# Patient Record
Sex: Male | Born: 1935 | Race: White | Hispanic: No | Marital: Married | Smoking: Former smoker
Health system: Southern US, Community
[De-identification: ages and names within clinical notes are randomized; demographics above are authoritative.]

## PROBLEM LIST (undated history)

## (undated) DIAGNOSIS — I10 Essential (primary) hypertension: Secondary | ICD-10-CM

## (undated) DIAGNOSIS — M65812 Other synovitis and tenosynovitis, left shoulder: Secondary | ICD-10-CM

## (undated) DIAGNOSIS — M7542 Impingement syndrome of left shoulder: Secondary | ICD-10-CM

## (undated) DIAGNOSIS — E7800 Pure hypercholesterolemia, unspecified: Secondary | ICD-10-CM

## (undated) DIAGNOSIS — M75122 Complete rotator cuff tear or rupture of left shoulder, not specified as traumatic: Secondary | ICD-10-CM

## (undated) DIAGNOSIS — M25552 Pain in left hip: Secondary | ICD-10-CM

## (undated) HISTORY — DX: Pain in left hip: M25.552

## (undated) HISTORY — DX: Essential (primary) hypertension: I10

## (undated) HISTORY — DX: Other synovitis and tenosynovitis, left shoulder: M65.812

## (undated) HISTORY — DX: Complete rotator cuff tear or rupture of left shoulder, not specified as traumatic: M75.122

## (undated) HISTORY — DX: Pure hypercholesterolemia, unspecified: E78.00

## (undated) HISTORY — DX: Impingement syndrome of left shoulder: M75.42

## (undated) SURGERY — ESOPHAGOGASTRODUODENOSCOPY (EGD) WITH PROPOFOL
Anesthesia: Monitor Anesthesia Care

---

## 1993-07-16 HISTORY — PX: ELBOW SURGERY: SHX618

## 2004-04-25 ENCOUNTER — Ambulatory Visit: Payer: Self-pay

## 2004-08-23 ENCOUNTER — Ambulatory Visit: Payer: Self-pay | Admitting: Unknown Physician Specialty

## 2004-12-10 ENCOUNTER — Emergency Department: Payer: Self-pay | Admitting: Emergency Medicine

## 2006-06-17 ENCOUNTER — Emergency Department: Payer: Self-pay | Admitting: Unknown Physician Specialty

## 2013-03-17 ENCOUNTER — Ambulatory Visit: Payer: Self-pay | Admitting: Unknown Physician Specialty

## 2014-04-18 ENCOUNTER — Emergency Department: Payer: Self-pay | Admitting: Emergency Medicine

## 2014-04-19 DIAGNOSIS — E7800 Pure hypercholesterolemia, unspecified: Secondary | ICD-10-CM

## 2014-04-19 DIAGNOSIS — I10 Essential (primary) hypertension: Secondary | ICD-10-CM

## 2014-04-19 HISTORY — DX: Pure hypercholesterolemia, unspecified: E78.00

## 2014-04-19 HISTORY — DX: Essential (primary) hypertension: I10

## 2014-07-21 DIAGNOSIS — M7542 Impingement syndrome of left shoulder: Secondary | ICD-10-CM

## 2014-07-21 DIAGNOSIS — M75122 Complete rotator cuff tear or rupture of left shoulder, not specified as traumatic: Secondary | ICD-10-CM

## 2014-07-21 DIAGNOSIS — M65912 Unspecified synovitis and tenosynovitis, left shoulder: Secondary | ICD-10-CM

## 2014-07-21 DIAGNOSIS — M65812 Other synovitis and tenosynovitis, left shoulder: Secondary | ICD-10-CM

## 2014-07-21 HISTORY — DX: Impingement syndrome of left shoulder: M75.42

## 2014-07-21 HISTORY — DX: Complete rotator cuff tear or rupture of left shoulder, not specified as traumatic: M75.122

## 2014-07-21 HISTORY — DX: Unspecified synovitis and tenosynovitis, left shoulder: M65.912

## 2014-07-21 HISTORY — DX: Other synovitis and tenosynovitis, left shoulder: M65.812

## 2016-02-08 DIAGNOSIS — M25552 Pain in left hip: Secondary | ICD-10-CM | POA: Insufficient documentation

## 2016-02-08 HISTORY — DX: Pain in left hip: M25.552

## 2016-05-15 ENCOUNTER — Telehealth: Payer: Self-pay | Admitting: Gastroenterology

## 2016-05-15 NOTE — Telephone Encounter (Signed)
Left voice message for patient to call and schedule appointment with GI for dysphagia. Referred by [facility]

## 2016-05-22 ENCOUNTER — Encounter: Payer: Self-pay | Admitting: Gastroenterology

## 2016-05-22 ENCOUNTER — Other Ambulatory Visit: Payer: Self-pay

## 2016-05-22 ENCOUNTER — Other Ambulatory Visit: Payer: Self-pay | Admitting: Gastroenterology

## 2016-05-22 ENCOUNTER — Ambulatory Visit (INDEPENDENT_AMBULATORY_CARE_PROVIDER_SITE_OTHER): Payer: Medicare Other | Admitting: Gastroenterology

## 2016-05-22 ENCOUNTER — Telehealth: Payer: Self-pay | Admitting: Gastroenterology

## 2016-05-22 VITALS — BP 135/87 | HR 101 | Temp 97.4°F | Wt 154.0 lb

## 2016-05-22 DIAGNOSIS — R1310 Dysphagia, unspecified: Secondary | ICD-10-CM

## 2016-05-22 DIAGNOSIS — R1312 Dysphagia, oropharyngeal phase: Secondary | ICD-10-CM

## 2016-05-22 DIAGNOSIS — R1311 Dysphagia, oral phase: Secondary | ICD-10-CM

## 2016-05-22 DIAGNOSIS — R1319 Other dysphagia: Secondary | ICD-10-CM

## 2016-05-22 NOTE — Progress Notes (Signed)
Gastroenterology Consultation  Referring Provider:     No ref. provider found Primary Care Physician:  Devon PentonMark F Miller, MD Primary Gastroenterologist:  Devon. Wyline MoodKiran Jarett Elliott  Reason for Consultation:     dysphagia        HPI:   Devon FreedHoward S Saur Jr. is a 82 y.o. y/o male referred for consultation & management  by Devon. Danella PentonMark F Miller, MD.     Dysphagia: Onset and any progression: 3 weeks back was having an omlette , got stuck , points to his chest , went to the restroom,threw up , returned and still could not swallow. 2 weeks after, was having dinner, having soup, and "got stuck " had to go back to the rest room and throw up again.He says he had a lot of associated coughing.  Presently no issues . Able to swallow solids and liquids.   History of asthma/allergy : no History of heartburn/Reflux : no  Weight loss : no   Prior EGD: many years back recalls had an esophageal dilation PPI/H2 blocker use : No    Not on any blood thinners.  Past Medical History:  Diagnosis Date  . Complete tear of left rotator cuff 07/21/2014  . Essential hypertension 04/19/2014  . Impingement syndrome of left shoulder 07/21/2014  . Left hip pain 02/08/2016  . Left supraspinatus tenosynovitis 07/21/2014  . Pure hypercholesterolemia 04/19/2014    Past Surgical History:  Procedure Laterality Date  . ELBOW SURGERY  1995    Prior to Admission medications   Medication Sig Start Date End Date Taking? Authorizing Provider  amLODipine (NORVASC) 10 MG tablet Take by mouth.    Historical Provider, MD  Colchicine 0.6 MG CAPS TAKE 1 CAPSULE EVERY DAY FOR GOUT 02/07/16   Historical Provider, MD  fluorouracil (EFUDEX) 5 % cream Apply to area in front of left ear twice daily for four weeks. 02/28/15   Historical Provider, MD  hydrochlorothiazide (HYDRODIURIL) 12.5 MG tablet Take 12.5 mg by mouth.    Historical Provider, MD  lisinopril (PRINIVIL,ZESTRIL) 10 MG tablet Take 10 mg by mouth.    Historical Provider, MD  traMADol  Janean Sark(ULTRAM) 50 MG tablet Take by mouth. 08/05/15   Historical Provider, MD    Family History  Problem Relation Age of Onset  . Pancreatic cancer Mother      Social History  Substance Use Topics  . Smoking status: Former Games developermoker  . Smokeless tobacco: Never Used  . Alcohol use Yes     Comment: social    Allergies as of 05/22/2016 - never reviewed  Allergen Reaction Noted  . Ace inhibitors  01/31/2016  . Clarithromycin Hives 04/19/2014  . Etodolac  01/31/2016  . Levofloxacin Swelling 04/19/2014    Review of Systems:    All systems reviewed and negative except where noted in HPI.   Physical Exam:  There were no vitals taken for this visit. No LMP for male patient. Psych:  Alert and cooperative. Normal mood and affect. General:   Alert,  Well-developed, well-nourished, pleasant and cooperative in NAD Head:  Normocephalic and atraumatic. Eyes:  Sclera clear, no icterus.   Conjunctiva pink. Ears:  Normal auditory acuity. Nose:  No deformity, discharge, or lesions. Mouth:  No deformity or lesions,oropharynx pink & moist. Neck:  Supple; no masses or thyromegaly. Lungs:  Respirations even and unlabored.  Clear throughout to auscultation.   No wheezes, crackles, or rhonchi. No acute distress. Heart:  Regular rate and rhythm; no murmurs, clicks, rubs, or gallops.  Abdomen:  Normal bowel sounds.  No bruits.  Soft, non-tender and non-distended without masses, hepatosplenomegaly or hernias noted.  No guarding or rebound tenderness.    Msk:  Symmetrical without gross deformities. Good, equal movement & strength bilaterally. Pulses:  Normal pulses noted. Extremities:  No clubbing or edema.  No cyanosis. Neurologic:  Alert and oriented x3;  grossly normal neurologically. Skin:  Intact without significant lesions or rashes. No jaundice. Lymph Nodes:  No significant cervical adenopathy. Psych:  Alert and cooperative. Normal mood and affect.  Imaging Studies: No results found.  Assessment  and Plan:   Devon FreedHoward S Soler Jr. is a 82 y.o. y/o male has been referred for dysphagia on 2 recent occasions. His history is not clear , he does give some history of transfer dysphagia as well as esophageal dysphagia .   Plan :  1. EGD +/- dilation  2. Modified barium swallow to evaluate for transfer dysphagia.   I have discussed alternative options, risks & benefits,  which include, but are not limited to, bleeding, infection, perforation,respiratory complication & drug reaction.  The patient agrees with this plan & written consent will be obtained.    Follow up in 6 weeks  Devon Wyline MoodKiran Nimesh Riolo MD

## 2016-05-22 NOTE — Telephone Encounter (Signed)
Patients wife wanted to know what the plan was, updated my plan of tests based on his symptoms.   She had no further questions.   Dr Wyline MoodKiran Jayion Schneck  Gastroenterology/Hepatology Pager: [phone]

## 2016-05-31 ENCOUNTER — Encounter
Admission: RE | Disposition: A | Discharge status: Home / Self Care | Payer: Self-pay | Source: Ambulatory Visit | Attending: Gastroenterology

## 2016-05-31 ENCOUNTER — Ambulatory Visit: Payer: Medicare Other | Admitting: Anesthesiology

## 2016-05-31 ENCOUNTER — Ambulatory Visit
Admission: RE | Admit: 2016-05-31 | Discharge: 2016-05-31 | Disposition: A | Discharge status: Home / Self Care | Payer: Medicare Other | Source: Ambulatory Visit | Attending: Gastroenterology | Admitting: Gastroenterology

## 2016-05-31 ENCOUNTER — Encounter: Payer: Self-pay | Admitting: *Deleted

## 2016-05-31 DIAGNOSIS — K222 Esophageal obstruction: Secondary | ICD-10-CM

## 2016-05-31 DIAGNOSIS — R1310 Dysphagia, unspecified: Secondary | ICD-10-CM | POA: Insufficient documentation

## 2016-05-31 DIAGNOSIS — Z87891 Personal history of nicotine dependence: Secondary | ICD-10-CM | POA: Diagnosis not present

## 2016-05-31 DIAGNOSIS — K210 Gastro-esophageal reflux disease with esophagitis: Secondary | ICD-10-CM

## 2016-05-31 DIAGNOSIS — I10 Essential (primary) hypertension: Secondary | ICD-10-CM | POA: Diagnosis not present

## 2016-05-31 DIAGNOSIS — J449 Chronic obstructive pulmonary disease, unspecified: Secondary | ICD-10-CM | POA: Diagnosis not present

## 2016-05-31 DIAGNOSIS — E7800 Pure hypercholesterolemia, unspecified: Secondary | ICD-10-CM | POA: Diagnosis not present

## 2016-05-31 HISTORY — PX: ESOPHAGOGASTRODUODENOSCOPY (EGD) WITH PROPOFOL: SHX5813

## 2016-05-31 SURGERY — ESOPHAGOGASTRODUODENOSCOPY (EGD) WITH PROPOFOL
Anesthesia: General

## 2016-05-31 MED ORDER — PROPOFOL 10 MG/ML IV BOLUS
INTRAVENOUS | Status: DC | PRN
  Administered 2016-05-31: 40 mg via INTRAVENOUS

## 2016-05-31 MED ORDER — SODIUM CHLORIDE 0.9 % IV SOLN
INTRAVENOUS | Status: DC
  Administered 2016-05-31: 11:00:00 via INTRAVENOUS

## 2016-05-31 MED ORDER — GLYCOPYRROLATE 0.2 MG/ML IJ SOLN
INTRAMUSCULAR | Status: DC | PRN
  Administered 2016-05-31: 0.2 mg via INTRAVENOUS

## 2016-05-31 MED ORDER — OMEPRAZOLE 40 MG PO CPDR: 40.0000 mg | DELAYED_RELEASE_CAPSULE | Freq: Every day | ORAL | 0 refills | Status: DC

## 2016-05-31 MED ORDER — LIDOCAINE HCL (CARDIAC) 20 MG/ML IV SOLN
INTRAVENOUS | Status: DC | PRN
  Administered 2016-05-31: 80 mg via INTRAVENOUS

## 2016-05-31 MED ORDER — FENTANYL CITRATE (PF) 100 MCG/2ML IJ SOLN
INTRAMUSCULAR | Status: DC | PRN
  Administered 2016-05-31: 25 ug via INTRAVENOUS

## 2016-05-31 MED ORDER — PROPOFOL 500 MG/50ML IV EMUL
INTRAVENOUS | Status: DC | PRN
  Administered 2016-05-31: 100 ug/kg/min via INTRAVENOUS

## 2016-05-31 NOTE — Anesthesia Preprocedure Evaluation (Signed)
Anesthesia Evaluation  Patient identified by MRN, date of birth, ID band Patient awake    Reviewed: Allergy & Precautions, NPO status , Patient's Chart, lab work & pertinent test results  History of Anesthesia Complications Negative for: history of anesthetic complications  Airway Mallampati: II  TM Distance: >3 FB Neck ROM: Full    Dental  (+) Poor Dentition   Pulmonary neg sleep apnea, neg COPD, former smoker,    breath sounds clear to auscultation- rhonchi (-) wheezing      Cardiovascular hypertension, Pt. on medications (-) CAD and (-) Past MI  Rhythm:Regular Rate:Normal - Systolic murmurs and - Diastolic murmurs    Neuro/Psych negative neurological ROS  negative psych ROS   GI/Hepatic Neg liver ROS, dysphagia   Endo/Other  negative endocrine ROSneg diabetes  Renal/GU negative Renal ROS     Musculoskeletal negative musculoskeletal ROS (+)   Abdominal (+) - obese,   Peds  Hematology negative hematology ROS (+)   Anesthesia Other Findings Past Medical History: 07/21/2014: Complete tear of left rotator cuff 04/19/2014: Essential hypertension 07/21/2014: Impingement syndrome of left shoulder 02/08/2016: Left hip pain 07/21/2014: Left supraspinatus tenosynovitis 04/19/2014: Pure hypercholesterolemia   Reproductive/Obstetrics                             Anesthesia Physical Anesthesia Plan  ASA: II  Anesthesia Plan: General   Post-op Pain Management:    Induction: Intravenous  Airway Management Planned: Natural Airway  Additional Equipment:   Intra-op Plan:   Post-operative Plan:   Informed Consent: I have reviewed the patients History and Physical, chart, labs and discussed the procedure including the risks, benefits and alternatives for the proposed anesthesia with the patient or authorized representative who has indicated his/her understanding and acceptance.   Dental advisory  given  Plan Discussed with: CRNA and Anesthesiologist  Anesthesia Plan Comments:         Anesthesia Quick Evaluation

## 2016-05-31 NOTE — Transfer of Care (Signed)
Immediate Anesthesia Transfer of Care Note  Patient: Devon FreedHoward S Wahid Jr.  Procedure(s) Performed: Procedure(s): ESOPHAGOGASTRODUODENOSCOPY (EGD) WITH PROPOFOL (N/A)  Patient Location: PACU  Anesthesia Type:General  Level of Consciousness: sedated  Airway & Oxygen Therapy: Patient Spontanous Breathing and Patient connected to nasal cannula oxygen  Post-op Assessment: Report given to RN and Post -op Vital signs reviewed and stable  Post vital signs: Reviewed and stable  Last Vitals:  Vitals:   05/31/16 1026 05/31/16 1153  BP: (!) 142/85 106/84  Pulse: 82 (!) 101  Resp: 16 15  Temp: 36.7 C (P) 36.1 C    Last Pain:  Vitals:   05/31/16 1153  TempSrc: (P) Oral         Complications: No apparent anesthesia complications

## 2016-05-31 NOTE — Op Note (Signed)
[facility] Gastroenterology Patient Name: Devon GuthrieHoward Elliott Procedure Date: 05/31/2016 11:40 AM MRN: [id number] Account #: [id number] Date of Birth: 10/03/1926 Admit Type: Ambulatory Age: 82 [facility] 4 Gender: Male Note Status: Finalized Procedure:            Upper GI endoscopy Indications:          Dysphagia Providers:            Wyline MoodKiran Aliayah Tyer MD, MD Medicines:            Monitored Anesthesia Care Complications:        No immediate complications. Procedure:            Pre-Anesthesia Assessment:                       - ASA Grade Assessment: II - A patient with mild                        systemic disease.                       - Prior to the procedure, a History and Physical was                        performed, and patient medications, allergies and                        sensitivities were reviewed. The patient's tolerance of                        previous anesthesia was reviewed.                       - The risks and benefits of the procedure and the                        sedation options and risks were discussed with the                        patient. All questions were answered and informed                        consent was obtained.                       - Prior to the procedure, a History and Physical was                        performed, and patient medications, allergies and                        sensitivities were reviewed. The patient's tolerance of                        previous anesthesia was reviewed.                       - The risks and benefits of the procedure and the                        sedation options and risks were discussed with the  patient. All questions were answered and informed                        consent was obtained.                       After obtaining informed consent, the endoscope was                        passed under direct vision. Throughout the procedure,                        the  patient's blood pressure, pulse, and oxygen                        saturations were monitored continuously. The Endoscope                        was introduced through the mouth, and advanced to the                        third part of duodenum. The upper GI endoscopy was                        accomplished with ease. The patient tolerated the                        procedure well. Findings:      The stomach was normal.      Patchy moderately congested mucosa without active bleeding and with no       stigmata of bleeding was found in the first portion of the duodenum.       This was biopsied with a cold forceps for histology.      LA Grade A (one or more mucosal breaks less than 5 mm, not extending       between tops of 2 mucosal folds) esophagitis with bleeding was found 40       cm from the incisors.      One mild benign-appearing, intrinsic stenosis was found 40 cm from the       incisors. This measured 1.3 cm (inner diameter) x less than one cm (in       length) and was traversed. Impression:           - Normal stomach.                       - Congested duodenal mucosa. Biopsied.                       - LA Grade A reflux esophagitis.                       - Benign-appearing esophageal stenosis. Recommendation:       - Await pathology results.                       - Repeat upper endoscopy in 6 weeks to check healing.                       - Return to GI office as previously scheduled.                       -  Patient has a contact number available for                        emergencies. The signs and symptoms of potential                        delayed complications were discussed with the patient.                        Return to normal activities tomorrow. Written discharge                        instructions were provided to the patient.                       - Resume previous diet.                       - Use Prilosec (omeprazole) 40 mg PO daily for 6 weeks. Procedure Code(s):     --- Professional ---                       [phone], Esophagogastroduodenoscopy, flexible, transoral;                        with biopsy, single or multiple Diagnosis Code(s):    --- Professional ---                       K31.89, Other diseases of stomach and duodenum                       K21.0, Gastro-esophageal reflux disease with esophagitis                       K22.2, Esophageal obstruction                       R13.10, Dysphagia, unspecified CPT copyright 2016 American Medical Association. All rights reserved. The codes documented in this report are preliminary and upon coder review may  be revised to meet current compliance requirements. Wyline MoodKiran Sun Kihn, MD Wyline MoodKiran Cuca Benassi MD, MD 05/31/2016 11:52:57 AM This report has been signed electronically. Number of Addenda: 0 Note Initiated On: 05/31/2016 11:40 AM      [facility]

## 2016-05-31 NOTE — Anesthesia Postprocedure Evaluation (Signed)
Anesthesia Post Note  Patient: Azucena FreedHoward S Plog Jr.  Procedure(s) Performed: Procedure(s) (LRB): ESOPHAGOGASTRODUODENOSCOPY (EGD) WITH PROPOFOL (N/A)  Patient location during evaluation: Endoscopy Anesthesia Type: General Level of consciousness: awake and alert and oriented Pain management: pain level controlled Vital Signs Assessment: post-procedure vital signs reviewed and stable Respiratory status: spontaneous breathing, nonlabored ventilation and respiratory function stable Cardiovascular status: blood pressure returned to baseline and stable Postop Assessment: no signs of nausea or vomiting Anesthetic complications: no    Last Vitals:  Vitals:   05/31/16 1026 05/31/16 1153  BP: (!) 142/85 106/84  Pulse: 82 (!) 101  Resp: 16 15  Temp: 36.7 C 36.1 C    Last Pain:  Vitals:   05/31/16 1153  TempSrc: Oral                 Shyloh Krinke

## 2016-05-31 NOTE — H&P (Signed)
  Devon MoodKiran Ermalee Mealy MD [address] [name], [place] [address] Phone: [phone] Fax : [phone]  Primary Care Physician:  Danella PentonMark F Miller, MD Primary Gastroenterologist:  Dr. Wyline MoodKiran Deshon Hsiao   Pre-Procedure History & Physical: HPI:  Devon FreedHoward S Ligman Jr. is a 82 y.o. male is here for an endoscopy.   Past Medical History:  Diagnosis Date  . Complete tear of left rotator cuff 07/21/2014  . Essential hypertension 04/19/2014  . Impingement syndrome of left shoulder 07/21/2014  . Left hip pain 02/08/2016  . Left supraspinatus tenosynovitis 07/21/2014  . Pure hypercholesterolemia 04/19/2014    Past Surgical History:  Procedure Laterality Date  . ELBOW SURGERY  1995    Prior to Admission medications   Medication Sig Start Date End Date Taking? Authorizing Provider  amLODipine (NORVASC) 10 MG tablet Take by mouth.   Yes Historical Provider, MD  Colchicine 0.6 MG CAPS TAKE 1 CAPSULE EVERY DAY FOR GOUT 02/07/16   Historical Provider, MD  fluorouracil (EFUDEX) 5 % cream Apply to area in front of left ear twice daily for four weeks. 02/28/15   Historical Provider, MD  hydrochlorothiazide (HYDRODIURIL) 12.5 MG tablet Take 12.5 mg by mouth.    Historical Provider, MD  lisinopril (PRINIVIL,ZESTRIL) 10 MG tablet Take 10 mg by mouth.    Historical Provider, MD  traMADol Janean Sark(ULTRAM) 50 MG tablet Take by mouth. 08/05/15   Historical Provider, MD    Allergies as of 05/22/2016 - Review Complete 05/22/2016  Allergen Reaction Noted  . Ace inhibitors  01/31/2016  . Clarithromycin Hives 04/19/2014  . Etodolac  01/31/2016  . Levofloxacin Swelling 04/19/2014    Family History  Problem Relation Age of Onset  . Pancreatic cancer Mother     Social History   Social History  . Marital status: Married    Spouse name: N/A  . Number of children: N/A  . Years of education: N/A   Occupational History  . Not on file.   Social History Main Topics  . Smoking status: Former Games developermoker  . Smokeless tobacco: Never  Used  . Alcohol use Yes     Comment: social  . Drug use: No  . Sexual activity: Not on file   Other Topics Concern  . Not on file   Social History Narrative  . No narrative on file    Review of Systems: See HPI, otherwise negative ROS  Physical Exam: BP (!) 142/85   Pulse 82   Temp 98 F (36.7 C) (Oral)   Resp 16   Ht 5\' 9"  (1.753 m)   Wt 154 lb (69.9 kg)   SpO2 100%   BMI 22.74 kg/m  General:   Alert,  pleasant and cooperative in NAD Head:  Normocephalic and atraumatic. Neck:  Supple; no masses or thyromegaly. Lungs:  Clear throughout to auscultation.    Heart:  Regular rate and rhythm. Abdomen:  Soft, nontender and nondistended. Normal bowel sounds, without guarding, and without rebound.   Neurologic:  Alert and  oriented x4;  grossly normal neurologically.  Impression/Plan: Devon FreedHoward S Tippetts Jr. is here for an endoscopy to be performed for dysphagia  Risks, benefits, limitations, and alternatives regarding  endoscopy have been reviewed with the patient.  Questions have been answered.  All parties agreeable.   Devon MoodKiran Rusty Villella, MD  05/31/2016, 11:00 AM

## 2016-06-01 LAB — SURGICAL PATHOLOGY

## 2016-06-18 ENCOUNTER — Ambulatory Visit
Admission: RE | Admit: 2016-06-18 | Discharge: 2016-06-18 | Disposition: A | Discharge status: Home / Self Care | Payer: Medicare Other | Source: Ambulatory Visit | Attending: Gastroenterology | Admitting: Gastroenterology

## 2016-06-18 DIAGNOSIS — R1312 Dysphagia, oropharyngeal phase: Secondary | ICD-10-CM | POA: Insufficient documentation

## 2016-06-18 DIAGNOSIS — E7800 Pure hypercholesterolemia, unspecified: Secondary | ICD-10-CM | POA: Diagnosis not present

## 2016-06-18 DIAGNOSIS — I10 Essential (primary) hypertension: Secondary | ICD-10-CM | POA: Diagnosis not present

## 2016-06-18 NOTE — Therapy (Signed)
[facility] DIAGNOSTIC RADIOLOGY [address] [place], [place], [address] Phone: [phone]   Fax:     Modified Barium Swallow  Patient Details  Name: Devon FreedHoward S Tilmon Jr. MRN: [id number] Date of Birth: 02/08/1927 No Data Recorded  Encounter Date: 06/18/2016      End of Session - 06/18/16 1328    Visit Number 1   Number of Visits 1   Date for SLP Re-Evaluation 06/18/16   SLP Start Time 1245   SLP Stop Time  1328   SLP Time Calculation (min) 43 min   Activity Tolerance Patient tolerated treatment well      Past Medical History:  Diagnosis Date  . Complete tear of left rotator cuff 07/21/2014  . Essential hypertension 04/19/2014  . Impingement syndrome of left shoulder 07/21/2014  . Left hip pain 02/08/2016  . Left supraspinatus tenosynovitis 07/21/2014  . Pure hypercholesterolemia 04/19/2014    Past Surgical History:  Procedure Laterality Date  . ELBOW SURGERY  1995  . ESOPHAGOGASTRODUODENOSCOPY (EGD) WITH PROPOFOL N/A 05/31/2016   Procedure: ESOPHAGOGASTRODUODENOSCOPY (EGD) WITH PROPOFOL;  Surgeon: Wyline MoodKiran Anna, MD;  Location: [facility] ENDOSCOPY;  Service: Endoscopy;  Laterality: N/A;    There were no vitals filed for this visit.   Subjective: Patient behavior: (alertness, ability to follow instructions, etc.): The patient is alert, able to verbalize his symptoms, and follow directions.  Chief complaint: 2 recent episodes of choking   Objective:  Radiological Procedure: A videoflouroscopic evaluation of oral-preparatory, reflex initiation, and pharyngeal phases of the swallow was performed; as well as a screening of the upper esophageal phase.  I. POSTURE: Upright in MBS chair  II. VIEW: Lateral  III. COMPENSATORY STRATEGIES: N/A  IV. BOLUSES ADMINISTERED:   Thin Liquid: 2 small sips, 3 rapid consecutive sips   Nectar-thick Liquid: 1 moderate sip    Puree: 2 teaspoon presentations   Mechanical Soft: 1/ graham cracker in  applesauce  V. RESULTS OF EVALUATION: A. ORAL PREPARATORY PHASE: (The lips, tongue, and velum are observed for strength and coordination)       **Overall Severity Rating: Within normal limits  B. SWALLOW INITIATION/REFLEX: (The reflex is normal if "triggered" by the time the bolus reached the base of the tongue)  **Overall Severity Rating: Within normal limits  C. PHARYNGEAL PHASE: (Pharyngeal function is normal if the bolus shows rapid, smooth, and continuous transit through the pharynx and there is no pharyngeal residue after the swallow)  **Overall Severity Rating: Within normal limits  D. LARYNGEAL PENETRATION: (Material entering into the laryngeal inlet/vestibule but not aspirated) None  E. ASPIRATION: None  F. ESOPHAGEAL PHASE: (Screening of the upper esophagus): In the cervical esophagus there is a finger-like protrusion along the posterior wall during swallow (does not impede flow of boluses) consistent with prominent cricopharyngeus.     ASSESSMENT: This 82 year old man, with recent choking episodes, is presenting with normal oropharyngeal swallowing.  Oral control of the bolus including oral hold, rotary mastication, and anterior to posterior transfer are within normal limits. Timing of the pharyngeal swallow is within normal limits.  Aspects of the pharyngeal stage of swallowing including tongue base retraction, hyolaryngeal excursion, epiglottic inversion, duration/amplitude of UES opening, and laryngeal vestibule closure at the height of the swallow are within normal limits.  There is no vallecular residue.  There was no observed laryngeal penetration or aspiration.  It does not appear that choking is due to oropharyngeal dysphagia.  In the cervical esophagus there is a finger-like protrusion  along the posterior wall during swallow (does not impede flow of boluses) consistent with prominent cricopharyngeus.     PLAN/RECOMMENDATIONS:   A. Diet: Regular   B. Swallowing Precautions:  chew well, be mindful while eating and drinking   C. Recommended consultation to: return to Dr. Tobi BastosAnna as scheduled   D. Therapy recommendations N/A   E. Results and recommendations were discussed with the patient immediately following the study and the final report routed to the referring MD   Oropharyngeal dysphagia - Plan: DG OP Swallowing Func-Medicare/Speech Path, DG OP Swallowing Func-Medicare/Speech Path      G-Codes - 06/18/16 1329    Functional Assessment Tool Used MBSS, clinical judgment   Functional Limitations Swallowing   Swallow Current Status (Z6109(G8996) 0 percent impaired, limited or restricted   Swallow Goal Status (U0454(G8997) 0 percent impaired, limited or restricted   Swallow Discharge Status (U9811(G8998) 0 percent impaired, limited or restricted          Problem List Patient Active Problem List   Diagnosis Date Noted  . Oral phase dysphagia 05/22/2016  . Esophageal dysphagia 05/22/2016  . Left hip pain 02/08/2016  . Complete tear of left rotator cuff 07/21/2014  . Impingement syndrome of left shoulder 07/21/2014  . Left supraspinatus tenosynovitis 07/21/2014  . Essential hypertension 04/19/2014  . Pure hypercholesterolemia 04/19/2014   Dollene PrimroseSusan G Jeptha Hinnenkamp, MS/CCC- SLP  Leandrew KoyanagiAbernathy, Susie 06/18/2016, 1:29 PM  [facility] DIAGNOSTIC RADIOLOGY [address] [place], [place], [address] Phone: [phone]   Fax:     Name: Devon FreedHoward S Lazenby Jr. MRN: [id number] Date of Birth: 03/10/1927

## 2016-06-18 NOTE — Progress Notes (Signed)
Normal

## 2016-06-21 ENCOUNTER — Telehealth: Payer: Self-pay

## 2016-06-21 NOTE — Telephone Encounter (Signed)
Left vm on home number barium swallow was normal.

## 2016-06-21 NOTE — Telephone Encounter (Signed)
-----   Message from Wyline MoodKiran Anna, MD sent at 06/18/2016  2:22 PM EST ----- Normal

## 2016-06-25 ENCOUNTER — Ambulatory Visit: Payer: Medicare Other | Admitting: Gastroenterology

## 2016-06-26 ENCOUNTER — Encounter: Payer: Self-pay | Admitting: Gastroenterology

## 2016-06-26 ENCOUNTER — Other Ambulatory Visit: Payer: Self-pay

## 2016-06-26 ENCOUNTER — Ambulatory Visit (INDEPENDENT_AMBULATORY_CARE_PROVIDER_SITE_OTHER): Payer: Medicare Other | Admitting: Gastroenterology

## 2016-06-26 VITALS — BP 135/81 | HR 87 | Temp 98.7°F | Ht 68.0 in | Wt 161.0 lb

## 2016-06-26 DIAGNOSIS — K2100 Gastro-esophageal reflux disease with esophagitis, without bleeding: Secondary | ICD-10-CM

## 2016-06-26 DIAGNOSIS — R1310 Dysphagia, unspecified: Secondary | ICD-10-CM

## 2016-06-26 DIAGNOSIS — K222 Esophageal obstruction: Secondary | ICD-10-CM | POA: Diagnosis not present

## 2016-06-26 DIAGNOSIS — R1319 Other dysphagia: Secondary | ICD-10-CM

## 2016-06-26 MED ORDER — OMEPRAZOLE 40 MG PO CPDR: 40.0000 mg | DELAYED_RELEASE_CAPSULE | Freq: Every day | ORAL | 1 refills | Status: DC

## 2016-06-26 NOTE — Progress Notes (Signed)
Primary Care Physician: Danella PentonMark F Miller, MD  Primary Gastroenterologist:  Dr. Wyline MoodKiran Laiken Nohr   Chief Complaint  Patient presents with  . Follow-up    Upper Endoscopy and Modified Barium Swallow follow up     HPI: Devon FreedHoward S Genson Jr. is a 82 y.o. male here for follow up for dysphagia. He was lasyt seen on 05/22/2016 .  EGD 05/31/16 showed Grade A distal esophagitis with a stricture about 13 mm. It was traversed but not dilated since he had active inflammation. I commenced him on PPI with aim to dilate him in 6 weeks. Today he says that he is unsure if he ever did commence on the PPI after the endoscopy. He has since stopped eating steak and has had no issues with swallowing  Modified barium swallow was normal .  Current Outpatient Prescriptions  Medication Sig Dispense Refill  . fluorouracil (EFUDEX) 5 % cream Apply to area in front of left ear twice daily for four weeks.    . traMADol (ULTRAM) 50 MG tablet Take by mouth.     No current facility-administered medications for this visit.     Allergies as of 06/26/2016 - Review Complete 06/26/2016  Allergen Reaction Noted  . Ace inhibitors  01/31/2016  . Clarithromycin Hives 04/19/2014  . Etodolac  01/31/2016  . Levofloxacin Swelling 04/19/2014    ROS:  General: Negative for anorexia, weight loss, fever, chills, fatigue, weakness. ENT: Negative for hoarseness, difficulty swallowing , nasal congestion. CV: Negative for chest pain, angina, palpitations, dyspnea on exertion, peripheral edema.  Respiratory: Negative for dyspnea at rest, dyspnea on exertion, cough, sputum, wheezing.  GI: See history of present illness. GU:  Negative for dysuria, hematuria, urinary incontinence, urinary frequency, nocturnal urination.  Endo: Negative for unusual weight change.    Physical Examination:   BP 135/81   Pulse 87   Temp 98.7 F (37.1 C) (Oral)   Ht 5\' 8"  (1.727 m)   Wt 161 lb (73 kg)   BMI 24.48 kg/m   General: Well-nourished,  well-developed in no acute distress.  Eyes: No icterus. Conjunctivae pink. Mouth: Oropharyngeal mucosa moist and pink , no lesions erythema or exudate. Lungs: Clear to auscultation bilaterally. Non-labored. Heart: Regular rate and rhythm, no murmurs rubs or gallops.  Abdomen: Bowel sounds are normal, nontender, nondistended, no hepatosplenomegaly or masses, no abdominal bruits or hernia , no rebound or guarding.   Extremities: No lower extremity edema. No clubbing or deformities. Neuro: Alert and oriented x 3.  Grossly intact. Skin: Warm and dry, no jaundice.   Psych: Alert and cooperative, normal mood and affect.  Imaging Studies: Dg Op Swallowing Func-medicare/speech Path  Result Date: 06/18/2016 CLINICAL DATA:  Dysphagia. EXAM: MODIFIED BARIUM SWALLOW TECHNIQUE: Different consistencies of barium were administered orally to the patient by the Speech Pathologist. Imaging of the pharynx was performed in the lateral projection. FLUOROSCOPY TIME:  Fluoroscopy Time:  0 minutes 42 seconds Radiation Exposure Index (if provided by the fluoroscopic device): 1.8 mGy Number of Acquired Spot Images: Cine images COMPARISON:  None. FINDINGS: Thin liquid- within normal limits Nectar thick liquid- within normal limits Pure- within normal limits Pure with cracker- within normal limits IMPRESSION: Normal exam. Please refer to the Speech Pathologists report for complete details and recommendations. Electronically Signed   By: Maisie Fushomas  Register   On: 06/18/2016 14:02    Assessment and Plan:   Devon FreedHoward S Staffa Jr. is a 82 y.o. y/o male here to follow up for dysphagia. Recent EGD demonstrated Grade  A esophagitis and an esophageal stricture.I commenced him on a PPI with aim to dilate him in 6 weeks time. He is unsure if he started his PPI after the EGD. Hence I suggested we start on PPI today for 6 weeks to heal the esophagitis prior to dilation.   Plan  1. EGD+dilation after 6 weeks  I have discussed  alternative options, risks & benefits,  which include, but are not limited to, bleeding, infection, perforation,respiratory complication & drug reaction.  The patient agrees with this plan & written consent will be obtained.      Dr Wyline MoodKiran Aleene Swanner  MD Follow up in 3 months

## 2016-07-19 ENCOUNTER — Ambulatory Visit: Admission: RE | Admit: 2016-07-19 | Payer: Medicare Other | Source: Ambulatory Visit | Admitting: Gastroenterology

## 2016-07-19 ENCOUNTER — Encounter: Admission: RE | Payer: Self-pay | Source: Ambulatory Visit

## 2016-07-19 SURGERY — ESOPHAGOGASTRODUODENOSCOPY (EGD) WITH PROPOFOL
Anesthesia: General

## 2016-09-12 ENCOUNTER — Telehealth: Payer: Self-pay

## 2016-09-12 NOTE — Telephone Encounter (Signed)
LVM for pt. Advised callback for follow-up questions per Dr. Tobi BastosAnna.

## 2016-09-12 NOTE — Telephone Encounter (Signed)
-----   Message from Wyline MoodKiran Anna, MD sent at 09/11/2016  3:40 PM EST ----- Normal exam .  Marl Seago -check with the patient how his swallowing is doing    FYI Dr Hyacinth MeekerMiller

## 2016-09-13 ENCOUNTER — Telehealth: Payer: Self-pay

## 2016-09-13 NOTE — Telephone Encounter (Signed)
-----   Message from Kiran Anna, MD sent at 09/11/2016  3:40 PM EST ----- Normal exam .  Kemper Heupel -check with the patient how his swallowing is doing    FYI Dr Miller 

## 2016-09-13 NOTE — Telephone Encounter (Signed)
Advised pt of exam results per Dr. Tobi BastosAnna.   Pt states having no problems and finished medication 2 days prior.

## 2017-05-27 IMAGING — RF DG SWALLOWING FUNCTION
1 series · 5 of 5 positions shown · non-contrast
Comparison: None.

CLINICAL DATA: Dysphagia.

EXAM:
MODIFIED BARIUM SWALLOW
TECHNIQUE: Different consistencies of barium were administered orally to the
patient by the Speech Pathologist. Imaging of the pharynx was
performed in the lateral projection.
FLUOROSCOPY TIME:  Fluoroscopy Time:  0 minutes 42 seconds
Radiation Exposure Index (if provided by the fluoroscopic device):
1.8 mGy
Number of Acquired Spot Images: Cine images

[Series 2: cp_standard · 0.34mm/px · 2 acquisitions, 5 frames shown]
[im 1/2]
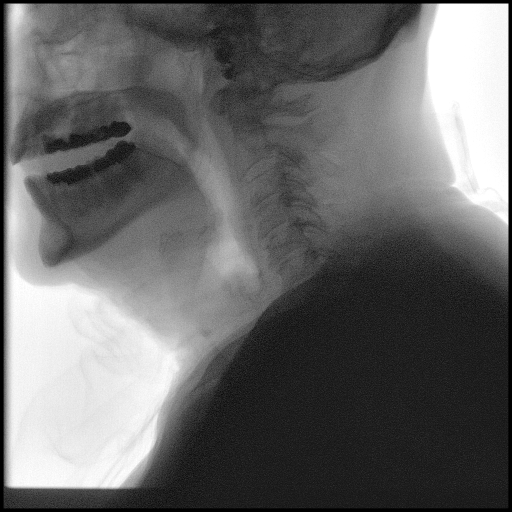
[im 1/2]
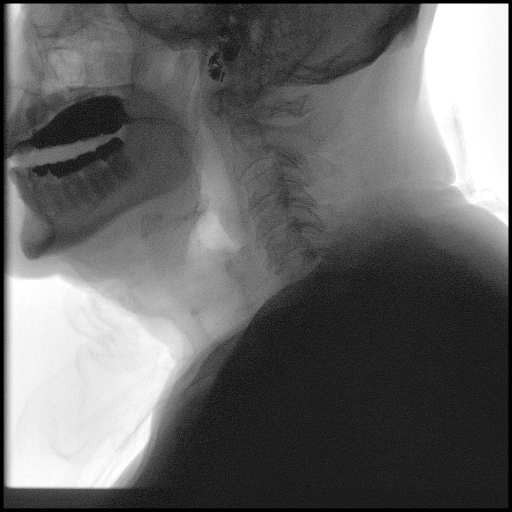
[im 1/2]
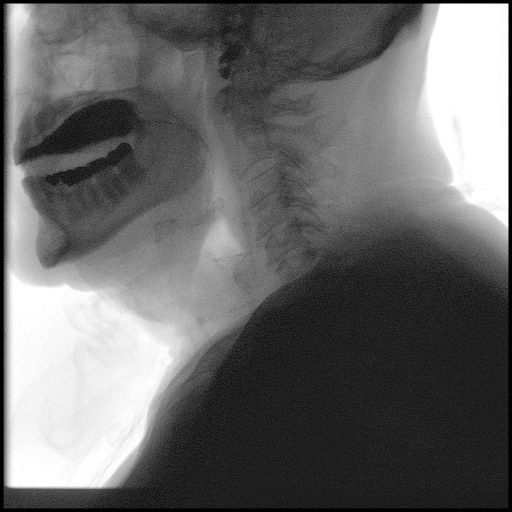
[im 1/2]
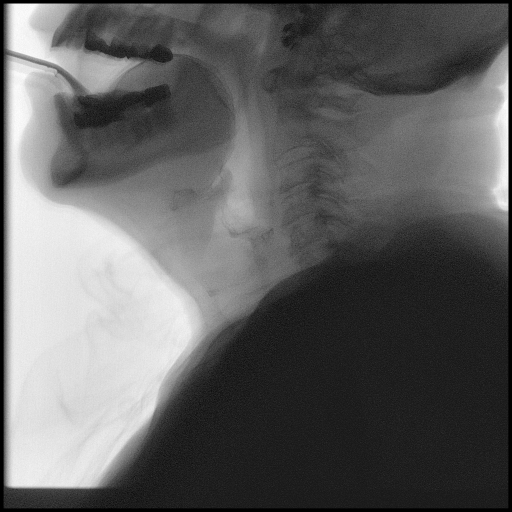
[im 2/2]
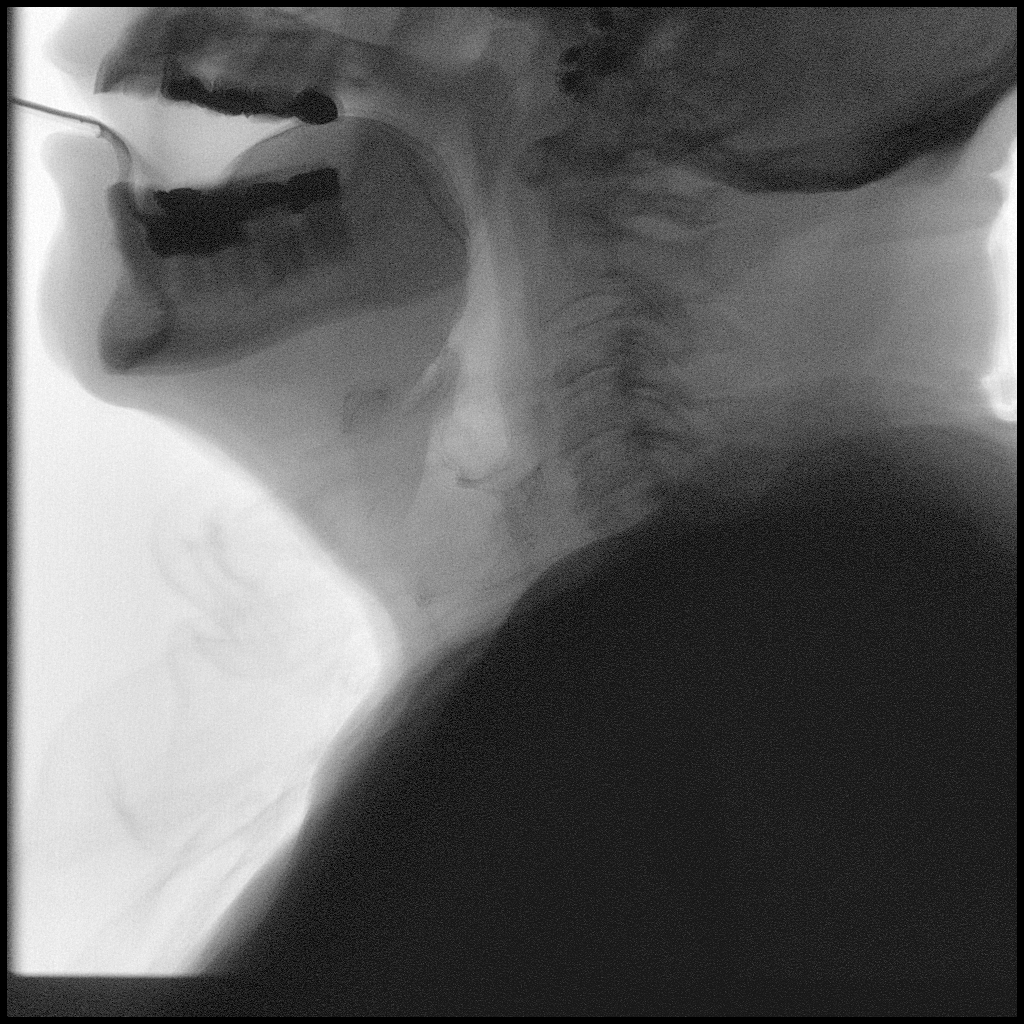

[5 of 5 positions shown; findings below may reference images not displayed]

FINDINGS: Thin liquid- within normal limits

Nectar thick liquid- within normal limits

Meier?Jim within normal limits

Meier?Tsuma with cracker- within normal limits
IMPRESSION: Normal exam.

Please refer to the Speech Pathologists report for complete details
and recommendations.

## 2019-07-13 ENCOUNTER — Emergency Department: Payer: Medicare Other

## 2019-07-13 ENCOUNTER — Encounter: Payer: Self-pay | Admitting: *Deleted

## 2019-07-13 ENCOUNTER — Emergency Department
Admission: EM | Admit: 2019-07-13 | Discharge: 2019-07-14 | Disposition: A | Discharge status: Home / Self Care | Payer: Medicare Other | Attending: Student in an Organized Health Care Education/Training Program | Admitting: Student in an Organized Health Care Education/Training Program

## 2019-07-13 ENCOUNTER — Other Ambulatory Visit: Payer: Self-pay

## 2019-07-13 DIAGNOSIS — I10 Essential (primary) hypertension: Secondary | ICD-10-CM | POA: Diagnosis not present

## 2019-07-13 DIAGNOSIS — Z79899 Other long term (current) drug therapy: Secondary | ICD-10-CM | POA: Insufficient documentation

## 2019-07-13 DIAGNOSIS — M545 Low back pain, unspecified: Secondary | ICD-10-CM

## 2019-07-13 DIAGNOSIS — Y939 Activity, unspecified: Secondary | ICD-10-CM | POA: Diagnosis not present

## 2019-07-13 DIAGNOSIS — Z87891 Personal history of nicotine dependence: Secondary | ICD-10-CM | POA: Insufficient documentation

## 2019-07-13 DIAGNOSIS — Y999 Unspecified external cause status: Secondary | ICD-10-CM | POA: Diagnosis not present

## 2019-07-13 DIAGNOSIS — R410 Disorientation, unspecified: Secondary | ICD-10-CM | POA: Insufficient documentation

## 2019-07-13 DIAGNOSIS — R109 Unspecified abdominal pain: Secondary | ICD-10-CM | POA: Diagnosis not present

## 2019-07-13 DIAGNOSIS — X58XXXA Exposure to other specified factors, initial encounter: Secondary | ICD-10-CM | POA: Diagnosis not present

## 2019-07-13 DIAGNOSIS — Y929 Unspecified place or not applicable: Secondary | ICD-10-CM | POA: Diagnosis not present

## 2019-07-13 DIAGNOSIS — S32010A Wedge compression fracture of first lumbar vertebra, initial encounter for closed fracture: Secondary | ICD-10-CM | POA: Diagnosis not present

## 2019-07-13 DIAGNOSIS — M4854XA Collapsed vertebra, not elsewhere classified, thoracic region, initial encounter for fracture: Secondary | ICD-10-CM | POA: Insufficient documentation

## 2019-07-13 DIAGNOSIS — S32000A Wedge compression fracture of unspecified lumbar vertebra, initial encounter for closed fracture: Secondary | ICD-10-CM

## 2019-07-13 DIAGNOSIS — W19XXXA Unspecified fall, initial encounter: Secondary | ICD-10-CM | POA: Diagnosis not present

## 2019-07-13 LAB — CBC
HCT: 46.9 % (ref 39.0–52.0)
Hemoglobin: 15.4 g/dL (ref 13.0–17.0)
MCH: 30.1 pg (ref 26.0–34.0)
MCHC: 32.8 g/dL (ref 30.0–36.0)
MCV: 91.8 fL (ref 80.0–100.0)
Platelets: 270 10*3/uL (ref 150–400)
RBC: 5.11 MIL/uL (ref 4.22–5.81)
RDW: 14.4 % (ref 11.5–15.5)
WBC: 13.8 10*3/uL — ABNORMAL HIGH (ref 4.0–10.5)
nRBC: 0 % (ref 0.0–0.2)

## 2019-07-13 LAB — URINALYSIS, COMPLETE (UACMP) WITH MICROSCOPIC
Bacteria, UA: NONE SEEN
Bilirubin Urine: NEGATIVE
Glucose, UA: NEGATIVE mg/dL
Hgb urine dipstick: NEGATIVE
Ketones, ur: 5 mg/dL — AB
Leukocytes,Ua: NEGATIVE
Nitrite: NEGATIVE
Protein, ur: >=300 mg/dL — AB
Specific Gravity, Urine: >1.046 — ABNORMAL HIGH (ref 1.005–1.030)
Squamous Epithelial / HPF: NONE SEEN (ref 0–5)
pH: 5 (ref 5.0–8.0)

## 2019-07-13 LAB — HEPATIC FUNCTION PANEL
ALT: 15 U/L (ref 0–44)
AST: 21 U/L (ref 15–41)
Albumin: 4 g/dL (ref 3.5–5.0)
Alkaline Phosphatase: 92 U/L (ref 38–126)
Bilirubin, Direct: 0.1 mg/dL (ref 0.0–0.2)
Indirect Bilirubin: 1.1 mg/dL — ABNORMAL HIGH (ref 0.3–0.9)
Total Bilirubin: 1.2 mg/dL (ref 0.3–1.2)
Total Protein: 6.9 g/dL (ref 6.5–8.1)

## 2019-07-13 LAB — BASIC METABOLIC PANEL
Anion gap: 13 (ref 5–15)
BUN: 28 mg/dL — ABNORMAL HIGH (ref 8–23)
CO2: 27 mmol/L (ref 22–32)
Calcium: 9.4 mg/dL (ref 8.9–10.3)
Chloride: 101 mmol/L (ref 98–111)
Creatinine, Ser: 1.06 mg/dL (ref 0.61–1.24)
GFR calc Af Amer: >60 mL/min (ref >60)
GFR calc non Af Amer: >60 mL/min (ref >60)
Glucose, Bld: 144 mg/dL — ABNORMAL HIGH (ref 70–99)
Potassium: 4 mmol/L (ref 3.5–5.1)
Sodium: 141 mmol/L (ref 135–145)

## 2019-07-13 LAB — AMMONIA: Ammonia: 21 umol/L (ref 9–35)

## 2019-07-13 LAB — TROPONIN I (HIGH SENSITIVITY)
Troponin I (High Sensitivity): 22 ng/L — ABNORMAL HIGH (ref <18)
Troponin I (High Sensitivity): 26 ng/L — ABNORMAL HIGH (ref <18)

## 2019-07-13 MED ORDER — SODIUM CHLORIDE 0.9 % IV BOLUS: 500.0000 mL | Freq: Once | INTRAVENOUS | Status: DC

## 2019-07-13 MED ORDER — FENTANYL CITRATE (PF) 100 MCG/2ML IJ SOLN: 25.0000 ug | INTRAMUSCULAR | Status: DC | PRN

## 2019-07-13 MED ORDER — ACETAMINOPHEN 500 MG PO TABS
1000.0000 mg | ORAL_TABLET | Freq: Once | ORAL | Status: AC
  Administered 2019-07-13: 1000 mg via ORAL
  Filled 2019-07-13: qty 2

## 2019-07-13 MED ORDER — OXYCODONE HCL 5 MG PO TABS: 5.0000 mg | ORAL_TABLET | ORAL | Status: DC | PRN

## 2019-07-13 MED ORDER — SODIUM CHLORIDE 0.9 % IV BOLUS
250.0000 mL | Freq: Once | INTRAVENOUS | Status: AC
  Administered 2019-07-13: 250 mL via INTRAVENOUS

## 2019-07-13 MED ORDER — AMLODIPINE BESYLATE 5 MG PO TABS: 5.0000 mg | ORAL_TABLET | Freq: Once | ORAL | Status: DC

## 2019-07-13 MED ORDER — AMLODIPINE BESYLATE 5 MG PO TABS
5.0000 mg | ORAL_TABLET | Freq: Every day | ORAL | Status: DC
  Administered 2019-07-14: 5 mg via ORAL
  Filled 2019-07-13: qty 1

## 2019-07-13 MED ORDER — IOHEXOL 350 MG/ML SOLN
100.0000 mL | Freq: Once | INTRAVENOUS | Status: AC | PRN
  Administered 2019-07-13: 100 mL via INTRAVENOUS

## 2019-07-13 MED ORDER — LIDOCAINE 5 % EX PTCH
1.0000 | MEDICATED_PATCH | CUTANEOUS | Status: DC
  Administered 2019-07-13: 21:00:00 1 via TRANSDERMAL
  Filled 2019-07-13: qty 1

## 2019-07-13 MED ORDER — ACETAMINOPHEN 325 MG PO TABS: 650.0000 mg | ORAL_TABLET | Freq: Four times a day (QID) | ORAL | Status: DC | PRN

## 2019-07-13 NOTE — ED Notes (Signed)
Pt appears more confused, pt is disoriented at present, spoke with pt wife, states he had a recent fall and has been to the ED for the back pain. States the confusion is new. EDP in triage placed new orders.

## 2019-07-13 NOTE — ED Triage Notes (Addendum)
Pt to ED from home reporting left sided back and flank pain. Today pt was unable to ambulate around the house. Pt reporting the pain has been for the past two days and worse when sitting. Fall one week ago that pt reports started his pain but the pain has worsened since then. Intermittent SOB reported while in triage.   Pt answering questions in triage but continuously states, "I am so confused." No head pain or head injury with fall.

## 2019-07-13 NOTE — ED Provider Notes (Signed)
[facility] Emergency Department Provider Note    First MD Initiated Contact with Patient 07/13/19 1717     (approximate)  I have reviewed the triage vital signs and the nursing notes.   HISTORY  Chief Complaint Flank Pain    HPI Devon Hartsell. is a 85 y.o. male presents the ER for evaluation of low back pain.  Patient reported becoming increasingly confused.  Patient appears anxious and stating that he needs to check on his wife.  He denies any chest pain.  States that back pain has been severe and started this morning.  States he is having trouble walking secondary to pain.  Denies any distal numbness or tingling.  Denies any chest pain or shortness of breath.  No fevers.    Past Medical History:  Diagnosis Date  . Complete tear of left rotator cuff 07/21/2014  . Essential hypertension 04/19/2014  . Impingement syndrome of left shoulder 07/21/2014  . Left hip pain 02/08/2016  . Left supraspinatus tenosynovitis 07/21/2014  . Pure hypercholesterolemia 04/19/2014   Family History  Problem Relation Age of Onset  . Pancreatic cancer Mother    Past Surgical History:  Procedure Laterality Date  . ELBOW SURGERY  1995  . ESOPHAGOGASTRODUODENOSCOPY (EGD) WITH PROPOFOL N/A 05/31/2016   Procedure: ESOPHAGOGASTRODUODENOSCOPY (EGD) WITH PROPOFOL;  Surgeon: Wyline Mood, MD;  Location: [facility] ENDOSCOPY;  Service: Endoscopy;  Laterality: N/A;   Patient Active Problem List   Diagnosis Date Noted  . Oral phase dysphagia 05/22/2016  . Esophageal dysphagia 05/22/2016  . Left hip pain 02/08/2016  . Complete tear of left rotator cuff 07/21/2014  . Impingement syndrome of left shoulder 07/21/2014  . Left supraspinatus tenosynovitis 07/21/2014  . Essential hypertension 04/19/2014  . Pure hypercholesterolemia 04/19/2014      Prior to Admission medications   Medication Sig Start Date End Date Taking? Authorizing Provider  acetaminophen (TYLENOL) 325 MG tablet Take  650 mg by mouth every 6 (six) hours as needed.   Yes [provider]  amLODipine (NORVASC) 5 MG tablet Take 5 mg by mouth daily.   Yes [provider]  baclofen (LIORESAL) 10 MG tablet Take 10 mg by mouth 3 (three) times daily as needed for muscle spasms.   Yes [provider]  meloxicam (MOBIC) 15 MG tablet Take 15 mg by mouth daily as needed for pain.   Yes [provider]    Allergies Ace inhibitors, Clarithromycin, Etodolac, and Levofloxacin    Social History Social History   Tobacco Use  . Smoking status: Former Games developer  . Smokeless tobacco: Never Used  Substance Use Topics  . Alcohol use: Yes    Comment: social  . Drug use: No    Review of Systems Patient denies headaches, rhinorrhea, blurry vision, numbness, shortness of breath, chest pain, edema, cough, abdominal pain, nausea, vomiting, diarrhea, dysuria, fevers, rashes or hallucinations unless otherwise stated above in HPI. ____________________________________________   PHYSICAL EXAM:  VITAL SIGNS: Vitals:   07/13/19 2030 07/13/19 2045  BP: (!) 181/98   Pulse: (!) 106 (!) 116  Resp: 20 (!) 23  Temp:    SpO2: 95% 97%    Constitutional: Alert and oriented.  Eyes: Conjunctivae are normal.  Head: Atraumatic. Nose: No congestion/rhinnorhea. Mouth/Throat: Mucous membranes are moist.   Neck: No stridor. Painless ROM.  Cardiovascular: Normal rate, regular rhythm. Grossly normal heart sounds.  Good peripheral circulation. Respiratory: Normal respiratory effort.  No retractions. Lungs CTAB. Gastrointestinal: Soft and nontender. No distention.  No abdominal bruits. No CVA tenderness. Genitourinary:  Musculoskeletal: No lower extremity tenderness nor edema.  No joint effusions. Neurologic:  Normal speech and language. No gross focal neurologic deficits are appreciated. No facial droop Skin:  Skin is warm, dry and intact. No rash noted. Psychiatric: Mood and affect are normal. Speech  and behavior are normal.  ____________________________________________   LABS (all labs ordered are listed, but only abnormal results are displayed)  Results for orders placed or performed during the hospital encounter of 07/13/19 (from the past 24 hour(s))  Basic metabolic panel     Status: Abnormal   Collection Time: 07/13/19  1:28 PM  Result Value Ref Range   Sodium 141 135 - 145 mmol/L   Potassium 4.0 3.5 - 5.1 mmol/L   Chloride 101 98 - 111 mmol/L   CO2 27 22 - 32 mmol/L   Glucose, Bld 144 (H) 70 - 99 mg/dL   BUN 28 (H) 8 - 23 mg/dL   Creatinine, Ser 1.611.06 0.61 - 1.24 mg/dL   Calcium 9.4 8.9 - 09.610.3 mg/dL   GFR calc non Af Amer >60 >60 mL/min   GFR calc Af Amer >60 >60 mL/min   Anion gap 13 5 - 15  CBC     Status: Abnormal   Collection Time: 07/13/19  1:28 PM  Result Value Ref Range   WBC 13.8 (H) 4.0 - 10.5 K/uL   RBC 5.11 4.22 - 5.81 MIL/uL   Hemoglobin 15.4 13.0 - 17.0 g/dL   HCT 04.546.9 40.939.0 - 81.152.0 %   MCV 91.8 80.0 - 100.0 fL   MCH 30.1 26.0 - 34.0 pg   MCHC 32.8 30.0 - 36.0 g/dL   RDW 91.414.4 78.211.5 - 95.615.5 %   Platelets 270 150 - 400 K/uL   nRBC 0.0 0.0 - 0.2 %  Troponin I (High Sensitivity)     Status: Abnormal   Collection Time: 07/13/19  1:28 PM  Result Value Ref Range   Troponin I (High Sensitivity) 22 (H) <18 ng/L  Hepatic function panel     Status: Abnormal   Collection Time: 07/13/19  1:28 PM  Result Value Ref Range   Total Protein 6.9 6.5 - 8.1 g/dL   Albumin 4.0 3.5 - 5.0 g/dL   AST 21 15 - 41 U/L   ALT 15 0 - 44 U/L   Alkaline Phosphatase 92 38 - 126 U/L   Total Bilirubin 1.2 0.3 - 1.2 mg/dL   Bilirubin, Direct 0.1 0.0 - 0.2 mg/dL   Indirect Bilirubin 1.1 (H) 0.3 - 0.9 mg/dL  Troponin I (High Sensitivity)     Status: Abnormal   Collection Time: 07/13/19  4:35 PM  Result Value Ref Range   Troponin I (High Sensitivity) 26 (H) <18 ng/L  Ammonia     Status: None   Collection Time: 07/13/19  5:43 PM  Result Value Ref Range   Ammonia 21 9 - 35 umol/L    Urinalysis, Complete w Microscopic     Status: Abnormal   Collection Time: 07/13/19  7:44 PM  Result Value Ref Range   Color, Urine YELLOW (A) YELLOW   APPearance CLEAR (A) CLEAR   Specific Gravity, Urine >1.046 (H) 1.005 - 1.030   pH 5.0 5.0 - 8.0   Glucose, UA NEGATIVE NEGATIVE mg/dL   Hgb urine dipstick NEGATIVE NEGATIVE   Bilirubin Urine NEGATIVE NEGATIVE   Ketones, ur 5 (A) NEGATIVE mg/dL   Protein, ur >=213>=300 (A) NEGATIVE mg/dL   Nitrite NEGATIVE NEGATIVE  Leukocytes,Ua NEGATIVE NEGATIVE   RBC / HPF 0-5 0 - 5 RBC/hpf   WBC, UA 0-5 0 - 5 WBC/hpf   Bacteria, UA NONE SEEN NONE SEEN   Squamous Epithelial / LPF NONE SEEN 0 - 5   Mucus PRESENT    ____________________________________________  EKG My review and personal interpretation at Time: 13:23   Indication: back pain  Rate: 120  Rhythm: afib Axis: normal Other: normal intervals, no stemi, nonspecific st abn ____________________________________________  RADIOLOGY  I personally reviewed all radiographic images ordered to evaluate for the above acute complaints and reviewed radiology reports and findings.  These findings were personally discussed with the patient.  Please see medical record for radiology report.  ____________________________________________   PROCEDURES  Procedure(s) performed:  Procedures    Critical Care performed: no ____________________________________________   INITIAL IMPRESSION / ASSESSMENT AND PLAN / ED COURSE  Pertinent labs & imaging results that were available during my care of the patient were reviewed by me and considered in my medical decision making (see chart for details).   DDX: dissection, stone, pyleo, colitis, msk strain, fracture, acs, dysrhythmia, sepsis, cva  Devon Elliott. is a 85 y.o. who presents to the ED with symptoms as described above.  Extensive work-up performed here in the ER.  Given his hypertension pain CT imaging ordered to evaluate for dissection shows  only evidence of mild compression fracture.  Does not seem clinically consistent with stroke.  Family reports that patient has been taking baclofen as well as tramadol and become more increasingly confused and I do suspect the confusion is more likely secondary to medication.  Does appear slightly dehydrated will give IV fluids.  Family does not feel comfortable taking him home due to his confusion and requesting physical therapy consultation which I will order as well as social work and case management.     The patient was evaluated in Emergency Department today for the symptoms described in the history of present illness. He/she was evaluated in the context of the global COVID-19 pandemic, which necessitated consideration that the patient might be at risk for infection with the SARS-CoV-2 virus that causes COVID-19. Institutional protocols and algorithms that pertain to the evaluation of patients at risk for COVID-19 are in a state of rapid change based on information released by regulatory bodies including the CDC and federal and state organizations. These policies and algorithms were followed during the patient's care in the ED.  As part of my medical decision making, I reviewed the following data within the [place] notes reviewed and incorporated, Labs reviewed, notes from prior ED visits and [place] Controlled Substance Database   ____________________________________________   FINAL CLINICAL IMPRESSION(S) / ED DIAGNOSES  Final diagnoses:  Compression fracture of lumbar vertebra, initial encounter, unspecified lumbar vertebral level (HCC)  Acute midline low back pain without sciatica  Confusion      NEW MEDICATIONS STARTED DURING THIS VISIT:  New Prescriptions   No medications on file     Note:  This document was prepared using Dragon voice recognition software and may include unintentional dictation errors.    Merlyn Lot, MD 07/13/19 [phone]

## 2019-07-13 NOTE — ED Notes (Signed)
Pt attempted to climb out of end of bed. When RN went in for assistance pt stated he needed to use bathroom. Pt forgot to use call light for assistance. Pt assisted to use urinal by this RN and tech. Pt then placed in a hospital bed for comfort measures. Pt instructed to use call light for assistance, bed alarm on bed set to alarm if pt attempts to get up unassisted.

## 2019-07-13 NOTE — ED Provider Notes (Signed)
[facility] Emergency Department Provider Note  ____________________________________________   None    (approximate)   I have reviewed the triage vital signs and the nursing notes.   Patient has been triaged with a MSE exam performed by myself at a minimum. Based on symptoms and screening exam, patient may receive a more in-depth exam, labs, imaging as detailed below. Patients have been advised of this setting and exam type at the time of patient interview.    HISTORY  Chief Complaint Flank Pain    HPI Devon FreedHoward S Standiford Jr. is a 85 y.o. male presents to the emergency department with a complaint of low back pain, confusion.  Patient had been previously triaged, however had a worsening mental status according to staff.  I evaluated the patient at this time.  Patient is clearly confused, unable to answer most medical questions.  Patient keeps repeating that he needs to leave to check on his wife who is sick at home.  Patient is wife was contacted by nursing staff and states that he has had a mental status decline recently.  Patient did sustain a fall approximately 2 weeks ago, head imaging of his back and hip the wife is unsure results.  Patient is currently complaining of significant back pain, is very antsy and cannot sit still.  Patient is very confused and while awaiting care, had been up wandering around the emergency department.  Patient is asking repetitive questions, appears very confused at this time.  Patient initially presented complaining of flank pain, shortness of breath, confusion.  Patient will receive a medical screening exam as detailed below.  Based off of this exam, more in depth exam, labs, imaging will be performed as needed for complaint.  Patient care will be eventually transferred to another provider in the emergency department for final exam, diagnosis and disposition.    Past Medical History:  Diagnosis Date  . Complete tear of left  rotator cuff 07/21/2014  . Essential hypertension 04/19/2014  . Impingement syndrome of left shoulder 07/21/2014  . Left hip pain 02/08/2016  . Left supraspinatus tenosynovitis 07/21/2014  . Pure hypercholesterolemia 04/19/2014    Patient Active Problem List   Diagnosis Date Noted  . Oral phase dysphagia 05/22/2016  . Esophageal dysphagia 05/22/2016  . Left hip pain 02/08/2016  . Complete tear of left rotator cuff 07/21/2014  . Impingement syndrome of left shoulder 07/21/2014  . Left supraspinatus tenosynovitis 07/21/2014  . Essential hypertension 04/19/2014  . Pure hypercholesterolemia 04/19/2014    Past Surgical History:  Procedure Laterality Date  . ELBOW SURGERY  1995  . ESOPHAGOGASTRODUODENOSCOPY (EGD) WITH PROPOFOL N/A 05/31/2016   Procedure: ESOPHAGOGASTRODUODENOSCOPY (EGD) WITH PROPOFOL;  Surgeon: Wyline MoodKiran Anna, MD;  Location: [facility] ENDOSCOPY;  Service: Endoscopy;  Laterality: N/A;    Prior to Admission medications   Medication Sig Start Date End Date Taking? Authorizing Provider  fluorouracil (EFUDEX) 5 % cream Apply to area in front of left ear twice daily for four weeks. 02/28/15   [provider]  omeprazole (PRILOSEC) 40 MG capsule Take 1 capsule (40 mg total) by mouth daily. 06/26/16 08/27/16  Wyline MoodAnna, Kiran, MD  traMADol Janean Sark(ULTRAM) 50 MG tablet Take by mouth. 08/05/15   [provider]    Allergies Ace inhibitors, Clarithromycin, Etodolac, and Levofloxacin  Family History  Problem Relation Age of Onset  . Pancreatic cancer Mother     Social History Social History   Tobacco Use  . Smoking status: Former Games developermoker  . Smokeless tobacco:  Never Used  Substance Use Topics  . Alcohol use: Yes    Comment: social  . Drug use: No    Review of Systems Constitutional: no reported fever ENT: no nasal congestion/rhinorhea. no sore throat Cardiovascular: no chest pain. Respiratory: no cough. no shortness of breath/difficulty breathing Gastroenterology: no  abdominal pain Musculoskeletal: Positive for back pain Integumentary: Negative for rash. Neurological: No focal weakness nor numbness.   ____________________________________________   PHYSICAL EXAM:  VITAL SIGNS: ED Triage Vitals  Enc Vitals Group     BP 07/13/19 1330 (!) 180/109     Pulse Rate 07/13/19 1330 (!) 122     Resp 07/13/19 1330 18     Temp 07/13/19 1330 98.4 F (36.9 C)     Temp src --      SpO2 07/13/19 1330 100 %     Weight 07/13/19 1325 160 lb (72.6 kg)     Height 07/13/19 1325 5\' 7"  (1.702 m)     Head Circumference --      Peak Flow --      Pain Score 07/13/19 1325 10     Pain Loc --      Pain Edu? --      Excl. in [place]? --     Constitutional: Alert and oriented. Generally well appearing and in no acute distress. Eyes: Conjunctivae are normal.  Nose: No significant congestion/rhinnorhea. Mouth: No gross oropharyngeal edema. no erythema/edema Neck: No stridor.  No meningeal signs.   Cardiovascular: Grossly normal heart sounds. Respiratory: Normal respiratory effort without significant tachypnea and no observed retractions. Lungs CTAB Gastrointestinal: No significant visible abdominal wall findings.  Bowel sounds x4 quadrants. no tenderness to palpation. Musculoskeletal: No gross deformities of extremities.  Patient is tender to palpation through the lumbar spine region.  No palpable abnormality. Neurologic:  Normal speech and language. No gross focal neurologic deficits are appreciated.  Skin:  Skin is warm, dry and intact. No rash noted.    ____________________________________________   LABS (all labs ordered are listed, but only abnormal results are displayed)  Labs Reviewed  BASIC METABOLIC PANEL - Abnormal; Notable for the following components:      Result Value   Glucose, Bld 144 (*)    BUN 28 (*)    All other components within normal limits  CBC - Abnormal; Notable for the following components:   WBC 13.8 (*)    All other components within  normal limits  TROPONIN I (HIGH SENSITIVITY) - Abnormal; Notable for the following components:   Troponin I (High Sensitivity) 22 (*)    All other components within normal limits  URINE CULTURE  URINALYSIS, COMPLETE (UACMP) WITH MICROSCOPIC  AMMONIA  TROPONIN I (HIGH SENSITIVITY)    ____________________________________________   RADIOLOGY Diamantina Providence Simon Aaberg, personally viewed and evaluated these images (plain radiographs) as part of my medical decision making, as well as reviewing the written report by the radiologist.  Official radiology report(s): DG Chest 2 View  Result Date: 07/13/2019 CLINICAL DATA:  Shortness of breath. EXAM: CHEST - 2 VIEW COMPARISON:  April 28, 2014. FINDINGS: The heart size and mediastinal contours are within normal limits. Both lungs are clear. No pneumothorax or pleural effusion is noted. The visualized skeletal structures are unremarkable. IMPRESSION: No active cardiopulmonary disease. Electronically Signed   By: Marijo Conception M.D.   On: 07/13/2019 14:02    ____________________________________________    INITIAL IMPRESSION / MDM / ASSESSMENT AND PLAN / ED COURSE  As part of my medical decision  making, I reviewed the following data within the electronic MEDICAL RECORD NUMBER Notes from prior ED visits and [place] Controlled Substance Database      Clinical Impression: Confusion and back pain  Plan: Labs, imaging.  Patient presented to emergency department complaining of worsening back pain, as well as significant confusion.  According to the family, patient has had a sudden decrease in his mental status.  Patient is very confused, periods he is oriented to no location and self, periods where he is oriented to self only.  Charge nurse notified, patient will be roomed as soon as there is space.  Patient has been screened based based on their arrival complaint, evaluated for an emergent condition, and at a minimum has received a medical screening exam.  At  this time, patient will receive the further work-up listed above that was determined by medical screening exam.  Patient care will be transferred to another provider in the emergency department once patient is roomed for final diagnosis and disposition.    ____________________________________________  Note:  This document was prepared using Conservation officer, historic buildings and may include unintentional dictation errors.    Lanette Hampshire 07/13/19 1724    Willy Eddy, MD 07/13/19 [phone]

## 2019-07-13 NOTE — ED Notes (Signed)
Gave pt meal tray and water, per MD request

## 2019-07-13 NOTE — ED Notes (Signed)
Pt repositioned in bed and instructed to press call light if he needs assistance.

## 2019-07-13 NOTE — ED Notes (Signed)
Patient's family updated via phone call. Family report patient has been more confused and restless since his fall approx 3 weeks ago. Patient has always been very active, but family are concerned he hasn't allowed himself to heal after fall. They report patient has been complaining frequently of pain since fall.  They are interested in rehab for patient.   Wife reports patient does have history of afib.   Patient is confused but follows commands. Hard of hearing. Patient overheard telling his family that he would be ready to leave in ten minutes. Patient and family updated on POC.

## 2019-07-13 NOTE — ED Notes (Signed)
First nurse note: here via ems from home with c/o Htn/weakness. BP 193/109 upon EMS arrival, norvasc 5mg  at home; fell a few days ago, pain in back from fall, no fx.  Afib on monitor, no hx afib, rate 110-150's, placed in wheelchair in lobby, NAD.

## 2019-07-14 LAB — URINE CULTURE: Culture: <10000 — AB

## 2019-07-14 NOTE — ED Notes (Signed)
Pt waiting on Education officer, museum. Fall alarm on bed is on and working.  Unlabored. Pt sleeping at this time, did not wake to assess.

## 2019-07-14 NOTE — ED Notes (Signed)
PT working with pt.

## 2019-07-14 NOTE — Evaluation (Signed)
Physical Therapy Evaluation Patient Details Name: Devon Elliott. MRN: [id number] DOB: Feb 25, 1927 Today's Date: 07/14/2019   History of Present Illness  Patient is a 85 year old male admitted after fall while out walking. Back pain,  L1 endplate fracture per imaging.  Clinical Impression  Patient received in bed, alert, HOH. Patient agreeable to PT assessment. He did not require assistance with supine><sit. Reports increased back pain with sitting on side of bed. Sit to stand with min guard assist. Ambulated with rolling walker 30 feet min guard then requested to sit down due to back pain. Patient then requested to use bathroom so he was assisted to toilet with no AD and min guard. Patient demonstrates decreased safety awareness with activity. ( told not to get up without letting me know and he was up cleaning up on his own) Patient will benefit from continued skilled PT while here to improve functional independence and safety with mobility.     Follow Up Recommendations Home health PT    Equipment Recommendations  None recommended by PT    Recommendations for Other Services       Precautions / Restrictions Precautions Precautions: Fall Restrictions Weight Bearing Restrictions: No      Mobility  Bed Mobility Overal bed mobility: Independent             General bed mobility comments: no assistance or bed rails needed to get oob.  Transfers Overall transfer level: Needs assistance Equipment used: None Transfers: Sit to/from Stand Sit to Stand: Min guard         General transfer comment: min guard for safety. to  Ambulation/Gait Ambulation/Gait assistance: Min guard Gait Distance (Feet): 30 Feet Assistive device: Rolling walker (2 wheeled);None Gait Pattern/deviations: Step-through pattern;WFL(Within Functional Limits) Gait velocity: WNL   General Gait Details: patient ambulated 30 feet with RW and min guard. No significant difficulty with ambulation, however  reports 10/10 pain with mobility. Requesting to get back to bed after this distance. Patient then requested to go to bathroom, assisted him min guard without assitive device 10 feet. Able to stand and pull up underwear with supervision, stood at sink washing hands.  Stairs            Wheelchair Mobility    Modified Rankin (Stroke Patients Only)       Balance Overall balance assessment: Mild deficits observed, not formally tested                                           Pertinent Vitals/Pain Pain Assessment: 0-10 Pain Score: 10-Worst pain ever Pain Descriptors / Indicators: Discomfort Pain Intervention(s): Monitored during session;Repositioned;Limited activity within patient's tolerance    Home Living Family/patient expects to be discharged to:: Private residence Living Arrangements: Spouse/significant other Available Help at Discharge: Family;Available 24 hours/day Type of Home: House Home Access: Stairs to enter   [employer] of Steps: " not many" Home Layout: One level Home Equipment: None Additional Comments: patient is somewhat confused and unsure of accuracy of home setup, equipment    Prior Function Level of Independence: Independent               Hand Dominance        Extremity/Trunk Assessment   Upper Extremity Assessment Upper Extremity Assessment: Overall WFL for tasks assessed    Lower Extremity Assessment Lower Extremity Assessment: Overall WFL for tasks assessed  Cervical / Trunk Assessment Cervical / Trunk Assessment: Normal  Communication   Communication: HOH  Cognition Arousal/Alertness: Awake/alert Behavior During Therapy: Impulsive Overall Cognitive Status: No family/caregiver present to determine baseline cognitive functioning                                 General Comments: patient oriented to location, not month. Decreased safety awareness      General Comments       Exercises     Assessment/Plan    PT Assessment Patient needs continued PT services  PT Problem List Decreased activity tolerance;Pain;Decreased cognition;Decreased mobility;Decreased safety awareness       PT Treatment Interventions Therapeutic exercise;Gait training;Balance training;Stair training;Functional mobility training;Therapeutic activities;Patient/family education    PT Goals (Current goals can be found in the Care Plan section)  Acute Rehab PT Goals Patient Stated Goal: to decrease pain PT Goal Formulation: With patient Time For Goal Achievement: 07/28/19 Potential to Achieve Goals: Good    Frequency Min 2X/week   Barriers to discharge        Co-evaluation               AM-PAC PT "6 Clicks" Mobility  Outcome Measure Help needed turning from your back to your side while in a flat bed without using bedrails?: None Help needed moving from lying on your back to sitting on the side of a flat bed without using bedrails?: None Help needed moving to and from a bed to a chair (including a wheelchair)?: A Little Help needed standing up from a chair using your arms (e.g., wheelchair or bedside chair)?: A Little Help needed to walk in hospital room?: A Little Help needed climbing 3-5 steps with a railing? : A Little 6 Click Score: 20    End of Session Equipment Utilized During Treatment: Gait belt Activity Tolerance: Patient limited by pain Patient left: with bed alarm set;in bed Nurse Communication: Mobility status PT Visit Diagnosis: History of falling (Z91.81);Pain;Other abnormalities of gait and mobility (R26.89) Pain - part of body: (back)    Time: 5277-8242 PT Time Calculation (min) (ACUTE ONLY): 26 min   Charges:   PT Evaluation $PT Eval Moderate Complexity: 1 Mod PT Treatments $Gait Training: 8-22 mins        Eugean Arnott, PT, GCS 07/14/19,10:24 AM

## 2019-07-14 NOTE — ED Notes (Signed)
Pt pulled up in bed and given breakfast tray. NAD.  Unlabored.

## 2019-07-14 NOTE — ED Provider Notes (Signed)
Patient has been cleared for discharge with home health.   Earleen Newport, MD 07/14/19 [phone]

## 2019-07-14 NOTE — ED Provider Notes (Signed)
-----------------------------------------   6:09 AM on 07/14/2019 -----------------------------------------   Blood pressure (!) 181/98, pulse (!) 116, temperature 98.4 F (36.9 C), resp. rate (!) 23, height 1.702 m (5\' 7" ), weight 72.6 kg, SpO2 97 %.  The patient is calm and cooperative at this time.  Awaiting disposition plan from Social Work team(s).   Hinda Kehr, MD 07/14/19 [phone]

## 2019-07-14 NOTE — ED Notes (Signed)
Pt given phone to call wife. Number dialed for him.

## 2019-07-14 NOTE — TOC Transition Note (Signed)
Transition of Care [facility]) - CM/SW Discharge Note   Patient Details  Name: Devon Elliott. MRN: [id number] Date of Birth: August 30, 1926  [facility]) CM/SW Contact:  Anselm Pancoast, RN Phone Number: 07/14/2019, 11:23 AM   Clinical Narrative:     Discharging home with [facility] for PT and aide services. Wife and [name], daughter in law notified of discharge and to expect call from [facility] agency. Daughter in law coming to pick patient up.         Patient Goals and CMS Choice Patient states their goals for this hospitalization and ongoing recovery are:: Get back home with family      Discharge Placement                       Discharge Plan and Services                                     Social Determinants of Health (SDOH) Interventions     Readmission Risk Interventions No flowsheet data found.

## 2019-07-14 NOTE — TOC Initial Note (Signed)
Transition of Care (TOC) - Initial/Assessment Note    Patient Details  Name: Devon Elliott. MRN: [id number] Date of Birth: 1927/03/28  Transition of [facility]) CM/SW Contact:    Anselm Pancoast, RN Phone Number: 07/14/2019, 10:46 AM  Clinical Narrative:                 RN CM spoke with patient and wife about concerns regarding discharge. Patient has been having frequent falls and wife, retired Marine scientist, was questioning possible therapy through home health care or need for SNF placement. Family is concerned about COVID in facilities. Patient was driving and very independent prior to sudden weakness and falls in the last few weeks. Currently has ramp at the home but no DME. Has strong support from family and friends.   Expected Discharge Plan: [place]     Patient Goals and CMS Choice Patient states their goals for this hospitalization and ongoing recovery are:: Get back home with family      Expected Discharge Plan and Services Expected Discharge Plan: [place]       Living arrangements for the past 2 months: Single Family Home                                      Prior Living Arrangements/Services Living arrangements for the past 2 months: Single Family Home Lives with:: Spouse Patient language and need for interpreter reviewed:: Yes Do you feel safe going back to the place where you live?: Yes      Need for Family Participation in Patient Care: Yes (Comment) Care giver support system in place?: Yes (comment)   Criminal Activity/Legal Involvement Pertinent to Current Situation/Hospitalization: No - Comment as needed  Activities of Daily Living      Permission Sought/Granted Permission sought to share information with : Case Manager                Emotional Assessment Appearance:: Appears stated age Attitude/Demeanor/Rapport: Engaged Affect (typically observed): Accepting Orientation: : Oriented to Self,  Oriented to Place, Oriented to  Time, Oriented to Situation Alcohol / Substance Use: Never Used Psych Involvement: No (comment)  Admission diagnosis:  Hypertension-EMS Patient Active Problem List   Diagnosis Date Noted  . Oral phase dysphagia 05/22/2016  . Esophageal dysphagia 05/22/2016  . Left hip pain 02/08/2016  . Complete tear of left rotator cuff 07/21/2014  . Impingement syndrome of left shoulder 07/21/2014  . Left supraspinatus tenosynovitis 07/21/2014  . Essential hypertension 04/19/2014  . Pure hypercholesterolemia 04/19/2014   PCP:  Rusty Aus, MD Pharmacy:   [facility] [place] [address] [place] [address] Phone: [phone] Fax: [phone]  [place], [place] [facility] [address] [place] [address] Phone: [phone] Fax: [phone]     Social Determinants of Health (SDOH) Interventions    Readmission Risk Interventions No flowsheet data found.

## 2019-07-14 NOTE — Care Management (Addendum)
RN CM: Call to June, spouse updating that patient will be needing Home health PT at discharge. Wife requested [facility] if possible as they have used it in the past and always had a positive experience.   RN CM: Outreach to [employer] [facility] [place] to search availability. Confirmed PT available.

## 2019-07-14 NOTE — ED Notes (Signed)
Pt verbalized understanding of discharge instructions and SW has communicated with pt's spouse and daughter in law about discharge and home health plan. Pt is in NAD at this time.

## 2019-07-20 ENCOUNTER — Other Ambulatory Visit: Payer: Self-pay | Admitting: Internal Medicine

## 2019-07-20 DIAGNOSIS — S22050A Wedge compression fracture of T5-T6 vertebra, initial encounter for closed fracture: Secondary | ICD-10-CM

## 2019-07-22 ENCOUNTER — Ambulatory Visit: Payer: Medicare Other

## 2019-07-24 ENCOUNTER — Ambulatory Visit
Admission: RE | Admit: 2019-07-24 | Discharge: 2019-07-24 | Disposition: A | Discharge status: Home / Self Care | Payer: Medicare Other | Source: Ambulatory Visit | Attending: Internal Medicine | Admitting: Internal Medicine

## 2019-07-24 DIAGNOSIS — S22050A Wedge compression fracture of T5-T6 vertebra, initial encounter for closed fracture: Secondary | ICD-10-CM | POA: Insufficient documentation

## 2019-07-28 ENCOUNTER — Other Ambulatory Visit: Payer: Self-pay | Admitting: Orthopedic Surgery

## 2019-07-28 ENCOUNTER — Other Ambulatory Visit: Admission: RE | Admit: 2019-07-28 | Payer: Medicare Other | Source: Ambulatory Visit

## 2019-07-29 ENCOUNTER — Encounter
Admission: RE | Admit: 2019-07-29 | Discharge: 2019-07-29 | Disposition: A | Discharge status: Home / Self Care | Payer: Medicare Other | Source: Ambulatory Visit | Attending: Orthopedic Surgery | Admitting: Orthopedic Surgery

## 2019-07-29 ENCOUNTER — Other Ambulatory Visit
Admission: RE | Admit: 2019-07-29 | Discharge: 2019-07-29 | Disposition: A | Discharge status: Home / Self Care | Payer: Medicare Other | Source: Ambulatory Visit | Attending: Orthopedic Surgery | Admitting: Orthopedic Surgery

## 2019-07-29 ENCOUNTER — Other Ambulatory Visit: Payer: Self-pay

## 2019-07-29 DIAGNOSIS — Z20822 Contact with and (suspected) exposure to covid-19: Secondary | ICD-10-CM | POA: Insufficient documentation

## 2019-07-29 DIAGNOSIS — Z01812 Encounter for preprocedural laboratory examination: Secondary | ICD-10-CM | POA: Insufficient documentation

## 2019-07-29 LAB — SARS CORONAVIRUS 2 (TAT 6-24 HRS): SARS Coronavirus 2: NEGATIVE

## 2019-07-29 NOTE — Patient Instructions (Signed)
To find out your arrival time for surgery, call [phone] 1:00-3:00 PM today.  Your procedure is scheduled on: Thursday 07/29/19 Report to Same Day Surgery 2nd floor [facility] Entrance-take elevator on left to 2nd floor.  Check in with surgery information desk.)  Remember: Instructions that are not followed completely may result in serious medical risk, up to and including death, or upon the discretion of your surgeon and anesthesiologist your surgery may need to be rescheduled.    __x__ 1. Do not eat any food (including mints, candies, chewing gum) after midnight tonight. You may drink clear liquids up to 2 hours before you are scheduled to arrive at the hospital for your procedure.  Do not drink anything within 2 hours of your scheduled arrival to the hospital.  Approved clear liquids:  --Water or Apple juice without pulp  --Clear carbohydrate beverage such as Gatorade or Powerade  --Black Coffee or Clear Tea (No milk, no creamers, do not add anything to the coffee or tea)   __x__ 2.  Finish drinking your provided Ensure clear beverage (inside your bag) 2 hours before your scheduled arrival tomorrow.    __x__ 3. No Alcohol or smoking for 24 hours before or after surgery.   __x__ 4. Notify your doctor if there is any change in your medical condition (cold, fever, infections).   __x__ 5. On the morning of surgery brush your teeth with toothpaste and water.  You may rinse your mouth with mouthwash if you wish.  Do not swallow any toothpaste or mouthwash.  Please read over the following fact sheets that you were given:   [facility] Preparing for Surgery and/or MRSA Information    __x__ Use Sage wipes as directed on instruction sheet.   Do not wear jewelry, lotions, powders, deodorant or perfumes on the day of surgery.  Do not shave below the neckline until after surgery.   Do not bring valuables to the hospital.  [facility] is not responsible for any belongings or  valuables.               Contacts and hearing aids may not be worn into surgery.  Please wear your hearing aids when you arrive for surgery, but they will need to be removed before going to the operating room.   For patients discharged on the day of surgery, you will NOT be permitted to drive yourself home.  You must have a responsible adult with you for 24 hours after surgery.  __x__ Take these medicines on the morning of surgery with a SMALL SIP OF WATER:  1. Amlodipine (Norvasc)  2. Baclofen, Oxycodone, Tylenol if needed  __x__ Follow recommendations from Cardiologist, Pulmonologist or PCP regarding stopping Aspirin, Coumadin, Plavix, Eliquis, Effient, Pradaxa, and Pletal.  __x__ TODAY: Stop Anti-inflammatories such as Aspirin, Meloxicam, Mobic, Advil, Ibuprofen, Motrin, Aleve, Naproxen, Naprosyn, BC/Goodies powders. You CAN take Tylenol if needed   __x__ TODAY: Do not take any over the counter herbal/nutritional supplements until after surgery.

## 2019-07-30 ENCOUNTER — Encounter
Admission: RE | Disposition: A | Discharge status: Home / Self Care | Payer: Self-pay | Source: Home / Self Care | Attending: Orthopedic Surgery

## 2019-07-30 ENCOUNTER — Other Ambulatory Visit: Payer: Self-pay

## 2019-07-30 ENCOUNTER — Ambulatory Visit: Payer: Medicare Other | Admitting: Registered Nurse

## 2019-07-30 ENCOUNTER — Ambulatory Visit
Admission: RE | Admit: 2019-07-30 | Discharge: 2019-07-30 | Disposition: A | Discharge status: Home / Self Care | Payer: Medicare Other | Attending: Orthopedic Surgery | Admitting: Orthopedic Surgery

## 2019-07-30 ENCOUNTER — Ambulatory Visit: Payer: Medicare Other

## 2019-07-30 ENCOUNTER — Encounter: Payer: Self-pay | Admitting: Orthopedic Surgery

## 2019-07-30 DIAGNOSIS — I10 Essential (primary) hypertension: Secondary | ICD-10-CM | POA: Diagnosis not present

## 2019-07-30 DIAGNOSIS — X58XXXA Exposure to other specified factors, initial encounter: Secondary | ICD-10-CM | POA: Insufficient documentation

## 2019-07-30 DIAGNOSIS — Z87891 Personal history of nicotine dependence: Secondary | ICD-10-CM | POA: Diagnosis not present

## 2019-07-30 DIAGNOSIS — M109 Gout, unspecified: Secondary | ICD-10-CM | POA: Diagnosis not present

## 2019-07-30 DIAGNOSIS — Z7982 Long term (current) use of aspirin: Secondary | ICD-10-CM | POA: Diagnosis not present

## 2019-07-30 DIAGNOSIS — Z791 Long term (current) use of non-steroidal anti-inflammatories (NSAID): Secondary | ICD-10-CM | POA: Insufficient documentation

## 2019-07-30 DIAGNOSIS — Z79899 Other long term (current) drug therapy: Secondary | ICD-10-CM | POA: Insufficient documentation

## 2019-07-30 DIAGNOSIS — S32010A Wedge compression fracture of first lumbar vertebra, initial encounter for closed fracture: Secondary | ICD-10-CM | POA: Insufficient documentation

## 2019-07-30 DIAGNOSIS — Z419 Encounter for procedure for purposes other than remedying health state, unspecified: Secondary | ICD-10-CM

## 2019-07-30 HISTORY — PX: KYPHOPLASTY: SHX5884

## 2019-07-30 SURGERY — KYPHOPLASTY
Anesthesia: General | Site: Back

## 2019-07-30 MED ORDER — FAMOTIDINE 20 MG PO TABS: 20.0000 mg | ORAL_TABLET | Freq: Once | ORAL | Status: AC

## 2019-07-30 MED ORDER — CEFAZOLIN SODIUM-DEXTROSE 2-4 GM/100ML-% IV SOLN
2.0000 g | INTRAVENOUS | Status: AC
  Administered 2019-07-30: 2 g via INTRAVENOUS

## 2019-07-30 MED ORDER — PROPOFOL 10 MG/ML IV BOLUS
INTRAVENOUS | Status: AC
  Filled 2019-07-30: qty 20

## 2019-07-30 MED ORDER — ONDANSETRON HCL 4 MG/2ML IJ SOLN
INTRAMUSCULAR | Status: DC | PRN
  Administered 2019-07-30: 4 mg via INTRAVENOUS

## 2019-07-30 MED ORDER — DEXAMETHASONE SODIUM PHOSPHATE 10 MG/ML IJ SOLN
INTRAMUSCULAR | Status: AC
  Filled 2019-07-30: qty 1

## 2019-07-30 MED ORDER — LIDOCAINE HCL (CARDIAC) PF 100 MG/5ML IV SOSY
PREFILLED_SYRINGE | INTRAVENOUS | Status: DC | PRN
  Administered 2019-07-30: 50 mg via INTRAVENOUS

## 2019-07-30 MED ORDER — PHENYLEPHRINE HCL (PRESSORS) 10 MG/ML IV SOLN
INTRAVENOUS | Status: DC | PRN
  Administered 2019-07-30: 150 ug via INTRAVENOUS

## 2019-07-30 MED ORDER — LIDOCAINE HCL 1 % IJ SOLN
INTRAMUSCULAR | Status: DC | PRN
  Administered 2019-07-30: 30 mL

## 2019-07-30 MED ORDER — FENTANYL CITRATE (PF) 100 MCG/2ML IJ SOLN
INTRAMUSCULAR | Status: AC
  Filled 2019-07-30: qty 2

## 2019-07-30 MED ORDER — FENTANYL CITRATE (PF) 100 MCG/2ML IJ SOLN
INTRAMUSCULAR | Status: DC | PRN
  Administered 2019-07-30 (×3): 12.5 ug via INTRAVENOUS

## 2019-07-30 MED ORDER — CEFAZOLIN SODIUM-DEXTROSE 2-4 GM/100ML-% IV SOLN
INTRAVENOUS | Status: AC
  Filled 2019-07-30: qty 100

## 2019-07-30 MED ORDER — LACTATED RINGERS IV SOLN: INTRAVENOUS | Status: DC

## 2019-07-30 MED ORDER — FENTANYL CITRATE (PF) 100 MCG/2ML IJ SOLN: 25.0000 ug | INTRAMUSCULAR | Status: DC | PRN

## 2019-07-30 MED ORDER — PROPOFOL 500 MG/50ML IV EMUL: INTRAVENOUS | Status: DC | PRN

## 2019-07-30 MED ORDER — ONDANSETRON HCL 4 MG/2ML IJ SOLN
INTRAMUSCULAR | Status: AC
  Filled 2019-07-30: qty 2

## 2019-07-30 MED ORDER — PROPOFOL 10 MG/ML IV BOLUS
INTRAVENOUS | Status: DC | PRN
  Administered 2019-07-30: 20 mg via INTRAVENOUS
  Administered 2019-07-30: 10 mg via INTRAVENOUS

## 2019-07-30 MED ORDER — KETAMINE HCL 10 MG/ML IJ SOLN
INTRAMUSCULAR | Status: DC | PRN
  Administered 2019-07-30: 20 mg via INTRAVENOUS

## 2019-07-30 MED ORDER — ONDANSETRON HCL 4 MG/2ML IJ SOLN: 4.0000 mg | Freq: Once | INTRAMUSCULAR | Status: DC | PRN

## 2019-07-30 MED ORDER — PROPOFOL 500 MG/50ML IV EMUL
INTRAVENOUS | Status: DC | PRN
  Administered 2019-07-30: 30 ug/kg/min via INTRAVENOUS

## 2019-07-30 MED ORDER — IOHEXOL 180 MG/ML  SOLN
INTRAMUSCULAR | Status: DC | PRN
  Administered 2019-07-30: 13:00:00 30 mL

## 2019-07-30 MED ORDER — BUPIVACAINE-EPINEPHRINE (PF) 0.5% -1:200000 IJ SOLN
INTRAMUSCULAR | Status: DC | PRN
  Administered 2019-07-30: 20 mL

## 2019-07-30 MED ORDER — CHLORHEXIDINE GLUCONATE 4 % EX LIQD: 60.0000 mL | Freq: Once | CUTANEOUS | Status: DC

## 2019-07-30 MED ORDER — FAMOTIDINE 20 MG PO TABS
ORAL_TABLET | ORAL | Status: AC
  Administered 2019-07-30: 20 mg via ORAL
  Filled 2019-07-30: qty 1

## 2019-07-30 SURGICAL SUPPLY — 20 items
CEMENT KYPHON CX01A KIT/MIXER (Cement) ×3 IMPLANT
COVER WAND RF STERILE (DRAPES) ×3 IMPLANT
DERMABOND ADVANCED (GAUZE/BANDAGES/DRESSINGS) ×2
DERMABOND ADVANCED .7 DNX12 (GAUZE/BANDAGES/DRESSINGS) ×1 IMPLANT
DEVICE BIOPSY BONE KYPHX (INSTRUMENTS) ×3 IMPLANT
DRAPE C-ARM XRAY 36X54 (DRAPES) ×3 IMPLANT
DURAPREP 26ML APPLICATOR (WOUND CARE) ×3 IMPLANT
FEE RENTAL RFA GENERATOR (MISCELLANEOUS) IMPLANT
GLOVE SURG SYN 9.0  PF PI (GLOVE) ×2
GLOVE SURG SYN 9.0 PF PI (GLOVE) ×1 IMPLANT
GOWN SRG 2XL LVL 4 RGLN SLV (GOWNS) ×1 IMPLANT
GOWN STRL NON-REIN 2XL LVL4 (GOWNS) ×2
GOWN STRL REUS W/ TWL LRG LVL3 (GOWN DISPOSABLE) ×1 IMPLANT
GOWN STRL REUS W/TWL LRG LVL3 (GOWN DISPOSABLE) ×2
PACK KYPHOPLASTY (MISCELLANEOUS) ×3 IMPLANT
RENTAL RFA  GENERATOR (MISCELLANEOUS)
RENTAL RFA GENERATOR (MISCELLANEOUS) IMPLANT
STRAP SAFETY 5IN WIDE (MISCELLANEOUS) ×3 IMPLANT
TRAY KYPHOPAK 15/3 EXPRESS 1ST (MISCELLANEOUS) ×1 IMPLANT
TRAY KYPHOPAK 20/3 EXPRESS 1ST (MISCELLANEOUS) ×3 IMPLANT

## 2019-07-30 NOTE — Transfer of Care (Signed)
Immediate Anesthesia Transfer of Care Note  Patient: Devon Elliott.  Procedure(s) Performed: L1 KYPHOPLASTY (N/A Back)  Patient Location: PACU  Anesthesia Type:General  Level of Consciousness: drowsy  Airway & Oxygen Therapy: Patient Spontanous Breathing and Patient connected to face mask oxygen  Post-op Assessment: Report given to RN and Post -op Vital signs reviewed and stable  Post vital signs: Reviewed and stable  Last Vitals:  Vitals Value Taken Time  BP 132/91 07/30/19 1345  Temp 98.6 F   Pulse 48 07/30/19 1350  Resp 0 07/30/19 1350  SpO2 100 % 07/30/19 1350  Vitals shown include unvalidated device data.  Last Pain:  Vitals:   07/30/19 1055  TempSrc: Temporal  PainSc: 9       Patients Stated Pain Goal: 3 (07/30/19 1055)  Complications: No apparent anesthesia complications

## 2019-07-30 NOTE — H&P (Signed)
Reviewed paper H+P, will be scanned into chart. No changes noted.  

## 2019-07-30 NOTE — Op Note (Signed)
Date July 30, 2019  time 1:49 PM   PATIENT:  Devon Elliott   PRE-OPERATIVE DIAGNOSIS:  closed wedge compression fracture of L1   POST-OPERATIVE DIAGNOSIS:  closed wedge compression fracture of L1   PROCEDURE:  Procedure(s): KYPHOPLASTY L1  SURGEON: Laurene Footman, MD   ASSISTANTS: None   ANESTHESIA:   local and MAC   EBL:  No intake/output data recorded.   BLOOD ADMINISTERED:none   DRAINS: none    LOCAL MEDICATIONS USED:  MARCAINE    and XYLOCAINE    SPECIMEN:   L1 vertebral body biopsy   DISPOSITION OF SPECIMEN:  Pathology   COUNTS:  YES   TOURNIQUET:  * No tourniquets in log *   IMPLANTS: Bone cement   DICTATION: .Dragon Dictation  patient was brought to the operating room and after adequate anesthesia was obtained the patient was placed prone.  C arm was brought in in good visualization of the affected level obtained on both AP and lateral projections.  After patient identification and timeout procedures were completed, local anesthetic was infiltrated with 10 cc 1% Xylocaine infiltrated subcutaneously.  This is done the area on the each side of the planned approach.  The back was then prepped and draped in the usual sterile manner and repeat timeout procedure carried out.  A spinal needle was brought down to the pedicle on the each side of  L1 and a 50-50 mix of 1% Xylocaine half percent Sensorcaine with epinephrine total of 20 cc injected.  After allowing this to set a small incision was made and the trocar was advanced into the vertebral body in an extrapedicular fashion on each side.  Biopsy was obtained Drilling was carried out balloon inserted with inflation to  2-1/2 cc to each side.  When the cement was appropriate consistency 4 cc on each side were injected into the vertebral body without extravasation, good fill superior to inferior endplates and from right to left sides along the inferior endplate.  After the cement had set the trochar was removed and permanent  C-arm views obtained.  The wound was closed with Dermabond followed by Band-Aid   PLAN OF CARE: Discharge to home after PACU   PATIENT DISPOSITION:  PACU - hemodynamically stable.

## 2019-07-30 NOTE — Anesthesia Procedure Notes (Signed)
Date/Time: 07/30/2019 12:54 PM Performed by: Lynden Oxford, CRNA Oxygen Delivery Method: Simple face mask

## 2019-07-30 NOTE — Anesthesia Preprocedure Evaluation (Addendum)
Anesthesia Evaluation  Patient identified by MRN, date of birth, ID band Patient awake    Reviewed: Allergy & Precautions, NPO status , Patient's Chart, lab work & pertinent test results  History of Anesthesia Complications Negative for: history of anesthetic complications  Airway Mallampati: II       Dental   Pulmonary neg sleep apnea, neg COPD, Not current smoker, former smoker,           Cardiovascular hypertension, Pt. on medications (-) CABG and (-) CHF (-) dysrhythmias (-) Valvular Problems/Murmurs     Neuro/Psych neg Seizures    GI/Hepatic Neg liver ROS, neg GERD  ,  Endo/Other  neg diabetes  Renal/GU negative Renal ROS     Musculoskeletal   Abdominal   Peds  Hematology   Anesthesia Other Findings   Reproductive/Obstetrics                            Anesthesia Physical Anesthesia Plan  ASA: II  Anesthesia Plan: General   Post-op Pain Management:    Induction: Intravenous  PONV Risk Score and Plan: 2 and Propofol infusion and TIVA  Airway Management Planned: Nasal Cannula  Additional Equipment:   Intra-op Plan:   Post-operative Plan:   Informed Consent: I have reviewed the patients History and Physical, chart, labs and discussed the procedure including the risks, benefits and alternatives for the proposed anesthesia with the patient or authorized representative who has indicated his/her understanding and acceptance.       Plan Discussed with:   Anesthesia Plan Comments:         Anesthesia Quick Evaluation

## 2019-07-30 NOTE — Anesthesia Postprocedure Evaluation (Signed)
Anesthesia Post Note  Patient: Devon Elliott.  Procedure(s) Performed: L1 KYPHOPLASTY (N/A Back)  Patient location during evaluation: PACU Anesthesia Type: General Level of consciousness: awake and alert Pain management: pain level controlled Vital Signs Assessment: post-procedure vital signs reviewed and stable Respiratory status: spontaneous breathing and respiratory function stable Cardiovascular status: stable Anesthetic complications: no     Last Vitals:  Vitals:   07/30/19 1400 07/30/19 1410  BP: (!) 146/96 (!) 160/93  Pulse: 95 83  Resp: 15 (!) 8  Temp:  36.6 C  SpO2: 97% 95%    Last Pain:  Vitals:   07/30/19 1410  TempSrc:   PainSc: 0-No pain                 Kavita Bartl K

## 2019-07-30 NOTE — Discharge Instructions (Addendum)
Remove Band-Aids on Saturday.  Okay to shower after that.  Take it easy today and tomorrow and resume more normal activities starting on Saturday.  Try to avoid bending over to the floor and lifting and try not to lift anything more than 5 pounds for 2 weeks.  Pain medicine as directed.  Call office if you are having problems.   AMBULATORY SURGERY  DISCHARGE INSTRUCTIONS   1) The drugs that you were given will stay in your system until tomorrow so for the next 24 hours you should not:  A) Drive an automobile B) Make any legal decisions C) Drink any alcoholic beverage   2) You may resume regular meals tomorrow.  Today it is better to start with liquids and gradually work up to solid foods.  You may eat anything you prefer, but it is better to start with liquids, then soup and crackers, and gradually work up to solid foods.   3) Please notify your doctor immediately if you have any unusual bleeding, trouble breathing, redness and pain at the surgery site, drainage, fever, or pain not relieved by medication.    4) Additional Instructions:        Please contact your physician with any problems or Same Day Surgery at [phone], Monday through Friday 6 am to 4 pm, or [facility] Main number at [phone]. 

## 2019-07-31 LAB — SURGICAL PATHOLOGY

## 2019-09-01 ENCOUNTER — Other Ambulatory Visit: Payer: Self-pay | Admitting: Orthopedic Surgery

## 2019-09-01 DIAGNOSIS — M545 Low back pain, unspecified: Secondary | ICD-10-CM

## 2019-09-08 ENCOUNTER — Other Ambulatory Visit: Payer: Self-pay

## 2019-09-08 ENCOUNTER — Ambulatory Visit
Admission: RE | Admit: 2019-09-08 | Discharge: 2019-09-08 | Disposition: A | Discharge status: Home / Self Care | Payer: Medicare Other | Source: Ambulatory Visit | Attending: Orthopedic Surgery | Admitting: Orthopedic Surgery

## 2019-09-08 DIAGNOSIS — M545 Low back pain, unspecified: Secondary | ICD-10-CM

## 2019-10-12 ENCOUNTER — Other Ambulatory Visit: Payer: Self-pay | Admitting: Orthopedic Surgery

## 2019-10-12 DIAGNOSIS — M4807 Spinal stenosis, lumbosacral region: Secondary | ICD-10-CM

## 2019-10-12 DIAGNOSIS — Z9889 Other specified postprocedural states: Secondary | ICD-10-CM

## 2019-10-18 ENCOUNTER — Ambulatory Visit: Payer: Medicare Other

## 2019-10-26 ENCOUNTER — Other Ambulatory Visit: Payer: Self-pay

## 2019-10-26 ENCOUNTER — Ambulatory Visit
Admission: RE | Admit: 2019-10-26 | Discharge: 2019-10-26 | Disposition: A | Discharge status: Home / Self Care | Payer: Medicare Other | Source: Ambulatory Visit | Attending: Orthopedic Surgery | Admitting: Orthopedic Surgery

## 2019-10-26 DIAGNOSIS — Z9889 Other specified postprocedural states: Secondary | ICD-10-CM | POA: Diagnosis present

## 2019-10-26 DIAGNOSIS — M4807 Spinal stenosis, lumbosacral region: Secondary | ICD-10-CM | POA: Diagnosis not present

## 2019-12-11 ENCOUNTER — Other Ambulatory Visit: Payer: Self-pay | Admitting: Neurosurgery

## 2019-12-11 DIAGNOSIS — S32001G Stable burst fracture of unspecified lumbar vertebra, subsequent encounter for fracture with delayed healing: Secondary | ICD-10-CM

## 2019-12-31 ENCOUNTER — Ambulatory Visit: Admission: RE | Admit: 2019-12-31 | Payer: Medicare Other | Source: Ambulatory Visit

## 2020-04-30 ENCOUNTER — Emergency Department: Payer: Medicare Other

## 2020-04-30 ENCOUNTER — Other Ambulatory Visit: Payer: Self-pay

## 2020-04-30 ENCOUNTER — Observation Stay
Admission: EM | Admit: 2020-04-30 | Discharge: 2020-05-01 | Disposition: A | Discharge status: Home / Self Care | Payer: Medicare Other | Attending: Internal Medicine | Admitting: Internal Medicine

## 2020-04-30 DIAGNOSIS — E870 Hyperosmolality and hypernatremia: Secondary | ICD-10-CM

## 2020-04-30 DIAGNOSIS — Z20822 Contact with and (suspected) exposure to covid-19: Secondary | ICD-10-CM | POA: Diagnosis not present

## 2020-04-30 DIAGNOSIS — K2289 Other specified disease of esophagus: Secondary | ICD-10-CM

## 2020-04-30 DIAGNOSIS — R1310 Dysphagia, unspecified: Secondary | ICD-10-CM

## 2020-04-30 DIAGNOSIS — R1319 Other dysphagia: Secondary | ICD-10-CM | POA: Diagnosis not present

## 2020-04-30 DIAGNOSIS — I10 Essential (primary) hypertension: Secondary | ICD-10-CM | POA: Diagnosis not present

## 2020-04-30 DIAGNOSIS — Z87891 Personal history of nicotine dependence: Secondary | ICD-10-CM | POA: Diagnosis not present

## 2020-04-30 DIAGNOSIS — K2210 Ulcer of esophagus without bleeding: Principal | ICD-10-CM | POA: Insufficient documentation

## 2020-04-30 DIAGNOSIS — I480 Paroxysmal atrial fibrillation: Secondary | ICD-10-CM

## 2020-04-30 DIAGNOSIS — E876 Hypokalemia: Secondary | ICD-10-CM

## 2020-04-30 DIAGNOSIS — K259 Gastric ulcer, unspecified as acute or chronic, without hemorrhage or perforation: Secondary | ICD-10-CM | POA: Insufficient documentation

## 2020-04-30 DIAGNOSIS — K219 Gastro-esophageal reflux disease without esophagitis: Secondary | ICD-10-CM | POA: Diagnosis not present

## 2020-04-30 DIAGNOSIS — L03116 Cellulitis of left lower limb: Secondary | ICD-10-CM | POA: Insufficient documentation

## 2020-04-30 DIAGNOSIS — Z79899 Other long term (current) drug therapy: Secondary | ICD-10-CM | POA: Diagnosis not present

## 2020-04-30 DIAGNOSIS — N179 Acute kidney failure, unspecified: Secondary | ICD-10-CM

## 2020-04-30 LAB — URINALYSIS, COMPLETE (UACMP) WITH MICROSCOPIC
Bacteria, UA: NONE SEEN
Bilirubin Urine: NEGATIVE
Glucose, UA: NEGATIVE mg/dL
Hgb urine dipstick: NEGATIVE
Ketones, ur: NEGATIVE mg/dL
Leukocytes,Ua: NEGATIVE
Nitrite: NEGATIVE
Protein, ur: 100 mg/dL — AB
Specific Gravity, Urine: 1.023 (ref 1.005–1.030)
pH: 5 (ref 5.0–8.0)

## 2020-04-30 LAB — CBC
HCT: 41.7 % (ref 39.0–52.0)
Hemoglobin: 13.7 g/dL (ref 13.0–17.0)
MCH: 31.3 pg (ref 26.0–34.0)
MCHC: 32.9 g/dL (ref 30.0–36.0)
MCV: 95.2 fL (ref 80.0–100.0)
Platelets: 222 10*3/uL (ref 150–400)
RBC: 4.38 MIL/uL (ref 4.22–5.81)
RDW: 13.6 % (ref 11.5–15.5)
WBC: 11.6 10*3/uL — ABNORMAL HIGH (ref 4.0–10.5)
nRBC: 0 % (ref 0.0–0.2)

## 2020-04-30 LAB — COMPREHENSIVE METABOLIC PANEL
ALT: 12 U/L (ref 0–44)
AST: 23 U/L (ref 15–41)
Albumin: 4.4 g/dL (ref 3.5–5.0)
Alkaline Phosphatase: 72 U/L (ref 38–126)
Anion gap: 12 (ref 5–15)
BUN: 40 mg/dL — ABNORMAL HIGH (ref 8–23)
CO2: 26 mmol/L (ref 22–32)
Calcium: 10.2 mg/dL (ref 8.9–10.3)
Chloride: 111 mmol/L (ref 98–111)
Creatinine, Ser: 1.53 mg/dL — ABNORMAL HIGH (ref 0.61–1.24)
GFR, Estimated: 39 mL/min — ABNORMAL LOW (ref >60)
Glucose, Bld: 164 mg/dL — ABNORMAL HIGH (ref 70–99)
Potassium: 3.7 mmol/L (ref 3.5–5.1)
Sodium: 149 mmol/L — ABNORMAL HIGH (ref 135–145)
Total Bilirubin: 1.1 mg/dL (ref 0.3–1.2)
Total Protein: 7.2 g/dL (ref 6.5–8.1)

## 2020-04-30 LAB — LIPASE, BLOOD: Lipase: 43 U/L (ref 11–51)

## 2020-04-30 LAB — RESPIRATORY PANEL BY RT PCR (FLU A&B, COVID)
Influenza A by PCR: NEGATIVE
Influenza B by PCR: NEGATIVE
SARS Coronavirus 2 by RT PCR: NEGATIVE

## 2020-04-30 MED ORDER — POTASSIUM CHLORIDE IN NACL 20-0.9 MEQ/L-% IV SOLN
INTRAVENOUS | Status: DC
  Filled 2020-04-30 (×4): qty 1000

## 2020-04-30 MED ORDER — ONDANSETRON HCL 4 MG PO TABS: 4.0000 mg | ORAL_TABLET | Freq: Four times a day (QID) | ORAL | Status: DC | PRN

## 2020-04-30 MED ORDER — LACTATED RINGERS IV BOLUS
1000.0000 mL | Freq: Once | INTRAVENOUS | Status: AC
  Administered 2020-04-30: 1000 mL via INTRAVENOUS

## 2020-04-30 MED ORDER — PANTOPRAZOLE SODIUM 40 MG IV SOLR
40.0000 mg | Freq: Two times a day (BID) | INTRAVENOUS | Status: DC
  Administered 2020-04-30 (×2): 40 mg via INTRAVENOUS
  Filled 2020-04-30 (×2): qty 40

## 2020-04-30 MED ORDER — ACETAMINOPHEN 650 MG RE SUPP: 650.0000 mg | Freq: Four times a day (QID) | RECTAL | Status: DC | PRN

## 2020-04-30 MED ORDER — ACETAMINOPHEN 325 MG PO TABS: 650.0000 mg | ORAL_TABLET | Freq: Four times a day (QID) | ORAL | Status: DC | PRN

## 2020-04-30 MED ORDER — ONDANSETRON HCL 4 MG/2ML IJ SOLN: 4.0000 mg | Freq: Four times a day (QID) | INTRAMUSCULAR | Status: DC | PRN

## 2020-04-30 MED ORDER — IOHEXOL 300 MG/ML  SOLN
75.0000 mL | Freq: Once | INTRAMUSCULAR | Status: AC | PRN
  Administered 2020-04-30: 60 mL via INTRAVENOUS

## 2020-04-30 MED ORDER — CEFAZOLIN SODIUM-DEXTROSE 1-4 GM/50ML-% IV SOLN
1.0000 g | Freq: Two times a day (BID) | INTRAVENOUS | Status: DC
  Administered 2020-04-30 (×2): 1 g via INTRAVENOUS
  Filled 2020-04-30 (×4): qty 50

## 2020-04-30 NOTE — Consult Note (Signed)
[facility] GI Inpatient Consult Note   Jamey Reas, M.D.  Reason for Consult: Dysphagia, abnormal Chest CT   Attending Requesting Consult: Alford Highland, M.D.  Outpatient Primary Physician: Bethann Punches, M.D.  History of Present Illness: Devon Elliott. is a 86 y.o. male who presents with 2 day complaint of dysphagia.  This culminated and patient is using softer foods, however, he ate macaroni and cheese last evening and it appeared to have gotten stuck.  He was unable to swallow liquids at home this morning although he was able to hold down his secretions.  He came to the emergency room and was evaluated by Dr. Larinda Buttery who had him attempt to swallow a small amount of water.  Patient was not able to handle this and spit it out within the 32nd time.. The patient says he can swallow some water at this time but intermittently is spitting up the fluid.  The patient denies any odynophagia, hematemesis, melena or abdominal pain.  He does not smoke.  He denies any symptoms of retrosternal burning, regurgitation or water brash.  He underwent an EGD around 2017 by Dr. Lupita Raider revealing grade D esophagitis.  Patient does not believe he has ever undergone a screening colonoscopy. For further evaluation patient underwent a chest CT revealing irregular filling defect in the distal esophagus suspicious for esophageal malignancy. Further questioning reveals patient has not had dysphagia up until the last few days.  He denies any significant weight loss in the last 6 months other than maybe 5 to 10 pounds.  Past Medical History:  Past Medical History:  Diagnosis Date  . Complete tear of left rotator cuff 07/21/2014  . Essential hypertension 04/19/2014  . Impingement syndrome of left shoulder 07/21/2014  . Left hip pain 02/08/2016  . Left supraspinatus tenosynovitis 07/21/2014  . Pure hypercholesterolemia 04/19/2014    Problem List: Patient Active Problem List   Diagnosis Date Noted  .  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 AKI (acute kidney injury) (HCC)   . Hypernatremia   . Cellulitis of left lower extremity   . Gastroesophageal reflux disease without esophagitis   . Oral phase dysphagia 05/22/2016  . Esophageal dysphagia 05/22/2016  . Left hip pain 02/08/2016  . Complete tear of left rotator cuff 07/21/2014  . Impingement syndrome of left shoulder 07/21/2014  . Left supraspinatus tenosynovitis 07/21/2014  . Essential hypertension 04/19/2014  . Pure hypercholesterolemia 04/19/2014    Past Surgical History: Past Surgical History:  Procedure Laterality Date  . ELBOW SURGERY  1995  . ESOPHAGOGASTRODUODENOSCOPY (EGD) WITH PROPOFOL N/A 05/31/2016   Procedure: ESOPHAGOGASTRODUODENOSCOPY (EGD) WITH PROPOFOL;  Surgeon: Wyline Mood, MD;  Location: [facility] ENDOSCOPY;  Service: Endoscopy;  Laterality: N/A;  . KYPHOPLASTY N/A 07/30/2019   Procedure: L1 KYPHOPLASTY;  Surgeon: Kennedy Bucker, MD;  Location: [facility] ORS;  Service: Orthopedics;  Laterality: N/A;    Allergies: Allergies  Allergen Reactions  . Ace Inhibitors     Other reaction(s): Kidney Disorder  . Clarithromycin Hives  . Etodolac     Other reaction(s): Kidney Disorder  . Levofloxacin Swelling    Home Medications: (Not in a hospital admission)  Home medication reconciliation was completed with the patient.   Scheduled Inpatient Medications:   . pantoprazole (PROTONIX) IV  40 mg Intravenous Q12H    Continuous Inpatient Infusions:   . 0.9 % NaCl with KCl 20 mEq / L 50 mL/hr at 04/30/20 1545  .  ceFAZolin (ANCEF) IV 1 g (04/30/20 1616)    PRN  Inpatient Medications:  acetaminophen **OR** acetaminophen, ondansetron **OR** ondansetron (ZOFRAN) IV  Family History: family history includes Diabetes in his father; Pancreatic cancer in his mother.   GI Family History: Negative  Social History:   reports that he has quit smoking. He has never used smokeless tobacco. He reports current alcohol use. He reports that he  does not use drugs. The patient denies ETOH, tobacco, or drug use.    Review of Systems: Review of Systems - Negative except that in HPI  Physical Examination: BP 118/75   Pulse 98   Temp 98 F (36.7 C) (Oral)   Resp 18   Ht 5\' 9"  (1.753 m)   Wt 66.7 kg   SpO2 98%   BMI 21.71 kg/m  Physical Exam Constitutional:      Appearance: Normal appearance.  HENT:     Head: Normocephalic.     Nose: Nose normal.     Mouth/Throat:     Mouth: Mucous membranes are moist.  Cardiovascular:     Rate and Rhythm: Normal rate.  Pulmonary:     Effort: Pulmonary effort is normal.     Breath sounds: Normal breath sounds.  Abdominal:     General: Abdomen is flat.     Palpations: Abdomen is soft.  Musculoskeletal:        General: Normal range of motion.     Cervical back: Normal range of motion.  Skin:    General: Skin is warm and dry.  Neurological:     General: No focal deficit present.     Mental Status: He is alert.  Psychiatric:        Mood and Affect: Mood normal.        Thought Content: Thought content normal.        Judgment: Judgment normal.     Data: Lab Results  Component Value Date   WBC 11.6 (H) 04/30/2020   HGB 13.7 04/30/2020   HCT 41.7 04/30/2020   MCV 95.2 04/30/2020   PLT 222 04/30/2020   Recent Labs  Lab 04/30/20 1141  HGB 13.7   Lab Results  Component Value Date   NA 149 (H) 04/30/2020   K 3.7 04/30/2020   CL 111 04/30/2020   CO2 26 04/30/2020   BUN 40 (H) 04/30/2020   CREATININE 1.53 (H) 04/30/2020   Lab Results  Component Value Date   ALT 12 04/30/2020   AST 23 04/30/2020   ALKPHOS 72 04/30/2020   BILITOT 1.1 04/30/2020   No results for input(s): APTT, INR, PTT in the last 168 hours. CBC Latest Ref Rng & Units 04/30/2020 07/13/2019  WBC 4.0 - 10.5 K/uL 11.6(H) 13.8(H)  Hemoglobin 13.0 - 17.0 g/dL 07/15/2019 09.3  Hematocrit 39 - 52 % 41.7 46.9  Platelets 150 - 400 K/uL 222 270    STUDIES: CT Soft Tissue Neck W Contrast  Result Date:  04/30/2020 CLINICAL DATA:  Cranial neuropathy, patient states cannot eat or drink for 2 days. EXAM: CT NECK WITH CONTRAST TECHNIQUE: Multidetector CT imaging of the neck was performed using the standard protocol following the bolus administration of intravenous contrast. CONTRAST:  3mL OMNIPAQUE IOHEXOL 300 MG/ML  SOLN COMPARISON:  Concurrent CT chest.  09/08/2002 CT neck. FINDINGS: Pharynx and larynx: Normal nasopharynx. No mass, focal swelling or abnormal enhancement. No significant airway narrowing. Dental amalgam beam hardening artifact limits evaluation of the oral cavity. Prominence of the right laryngeal ventricle. Symmetric appearance of the vocal folds. Salivary glands: No inflammation, mass, or stone. Thyroid:  Normal. Lymph nodes: None enlarged or abnormal density. Vascular: Normal intravascular enhancement. Aortic arch and carotid bifurcation atherosclerotic calcifications. Approximately 50% luminal narrowing of the proximal left ICA. Limited intracranial: Grossly unremarkable. Visualized orbits: No gross abnormalities. Mastoids and visualized paranasal sinuses: Clear paranasal sinuses. No mastoid effusion. Skeleton: Multilevel spondylosis. Grade 1 C2-3 anterolisthesis. No acute osseous abnormality. Upper chest: Please see concurrent CT chest for findings below the thoracic inlet. Other: None. IMPRESSION: No acute finding on CT neck. 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 Please see concurrent CT chest for findings below thoracic inlet. Electronically Signed   By: Stana Bunting M.D.   On: 04/30/2020 13:44   CT Chest W Contrast  Result Date: 04/30/2020 CLINICAL DATA:  Dysphagia, mediastinal mass EXAM: CT CHEST WITH CONTRAST TECHNIQUE: Multidetector CT imaging of the chest was performed during intravenous contrast administration. CONTRAST:  69mL OMNIPAQUE IOHEXOL 300 MG/ML  SOLN COMPARISON:  07/13/2019 FINDINGS: Cardiovascular: Aortic  atherosclerosis. Cardiomegaly. Left coronary artery calcifications. No pericardial effusion. Mediastinum/Nodes: No enlarged mediastinal, hilar, or axillary lymph nodes. There is circumferential masslike soft tissue thickening of the lower third of the esophagus just above the gastroesophageal junction, difficult to accurately measure but involving a segment approximately 5 cm in length (series 12, image 77, series 2, image 114). Small hiatal hernia. The proximal esophagus is distended and fluid-filled. Lungs/Pleura: Moderate centrilobular emphysema. Mild dependent bibasilar scarring and or atelectasis. Stable, benign 3 mm nodule of the right pulmonary apex (series 8, image 30). No pleural effusion or pneumothorax. Upper Abdomen: No acute abnormality. Musculoskeletal: No chest wall mass or suspicious bone lesions identified. Wedge deformity of the T6 vertebral body. IMPRESSION: 1. There is circumferential masslike soft tissue thickening of the lower third of the esophagus just above the gastroesophageal junction. The proximal esophagus is distended and fluid-filled. Findings are highly concerning for esophageal malignancy. 2. No evident lymphadenopathy or evidence of metastatic disease in the chest. 3. Emphysema (ICD10-J43.9). 4. Coronary artery disease.  Aortic Atherosclerosis (ICD10-I70.0). Electronically Signed   By: Lauralyn Primes M.D.   On: 04/30/2020 13:27   @IMAGES @  Assessment: 1. Dysphagia with abnormal CT worrisome for neoplasm in the distal esophagus - History somewhat inconsistent with neoplasm but CT results very concerning.      COVID-19 status: Tested negative   Recommendations: 1. Clear liquid diet. 2. IV hydration to treat dehydration. 3. EGD in am. The patient understands the nature of the planned procedure, indications, risks, alternatives and potential complications including but not limited to bleeding, infection, perforation, damage to internal organs and possible oversedation/side  effects from anesthesia. The patient agrees and gives consent to proceed.  Please refer to procedure notes for findings, recommendations and patient disposition/instructions.   Thank you for the consult. Please call with questions or concerns.  , "Rosina Lowenstein MD [facility] [address] [place], [place] [phone]  04/30/2020 4:59 PM

## 2020-04-30 NOTE — H&P (Signed)
[facility] [place] [facility]   PATIENT NAME: Devon Elliott    MR#:  [id number]  DATE OF BIRTH:  1927-04-10  DATE OF ADMISSION:  04/30/2020  PRIMARY CARE PHYSICIAN: Danella Penton, MD   REQUESTING/REFERRING PHYSICIAN: Dr Lonni Fix  CHIEF COMPLAINT:   Chief Complaint  Patient presents with  . Emesis    HISTORY OF PRESENT ILLNESS:  Devon Elliott  is a 86 y.o. male coming in with not eating for 2 days.  He states for the last 2 days nothing has been going down and he has been spitting up a lot.  He has lost about 10 pounds.  No abdominal pain.  Slight cough.  Mostly spitting up.  Would not call that vomiting.  Patient's wife states he has been eating a lot of tomatoes recently.  No complaints of chest pain or shortness of breath.  In the ER, the patient was found to have esophageal mass on CT scan and was a little dehydrated with creatinine up at 1.53.  Hospitalist services were contacted for further evaluation.  Patient initially did not want to stay in the hospital.  PAST MEDICAL HISTORY:   Past Medical History:  Diagnosis Date  . Complete tear of left rotator cuff 07/21/2014  . Essential hypertension 04/19/2014  . Impingement syndrome of left shoulder 07/21/2014  . Left hip pain 02/08/2016  . Left supraspinatus tenosynovitis 07/21/2014  . Pure hypercholesterolemia 04/19/2014    PAST SURGICAL HISTORY:   Past Surgical History:  Procedure Laterality Date  . ELBOW SURGERY  1995  . ESOPHAGOGASTRODUODENOSCOPY (EGD) WITH PROPOFOL N/A 05/31/2016   Procedure: ESOPHAGOGASTRODUODENOSCOPY (EGD) WITH PROPOFOL;  Surgeon: Wyline Mood, MD;  Location: [facility] ENDOSCOPY;  Service: Endoscopy;  Laterality: N/A;  . KYPHOPLASTY N/A 07/30/2019   Procedure: L1 KYPHOPLASTY;  Surgeon: Kennedy Bucker, MD;  Location: [facility] ORS;  Service: Orthopedics;  Laterality: N/A;    SOCIAL HISTORY:   Social History   Tobacco Use  . Smoking status: Former Games developer  . Smokeless tobacco: Never  Used  Substance Use Topics  . Alcohol use: Yes    Comment: social    FAMILY HISTORY:   Family History  Problem Relation Age of Onset  . Pancreatic cancer Mother   . Diabetes Father     DRUG ALLERGIES:   Allergies  Allergen Reactions  . Ace Inhibitors     Other reaction(s): Kidney Disorder  . Clarithromycin Hives  . Etodolac     Other reaction(s): Kidney Disorder  . Levofloxacin Swelling    REVIEW OF SYSTEMS:  CONSTITUTIONAL: No fever, chills or sweats.  Positive for weight loss.  EYES: States his vision is not good and needs to go the eye doctor. EARS, NOSE, AND THROAT: No tinnitus or ear pain. No sore throat.  Positive for hearing loss wears hearing aids. RESPIRATORY: Some cough.  No shortness of breath, wheezing or hemoptysis.  CARDIOVASCULAR: No chest pain, orthopnea, edema.  GASTROINTESTINAL: Positive for spitting up when he is trying to eat.  No nausea, vomiting, diarrhea or abdominal pain. No blood in bowel movements GENITOURINARY: No dysuria, hematuria.  ENDOCRINE: No polyuria, nocturia,  HEMATOLOGY: No anemia, easy bruising or bleeding SKIN: No rash or lesion. MUSCULOSKELETAL: No joint pain or arthritis.   NEUROLOGIC: No tingling, numbness, weakness.  PSYCHIATRY: No anxiety or depression.   MEDICATIONS AT HOME:   Prior to Admission medications   Medication Sig Start Date End Date Taking? Authorizing Provider  amLODipine (NORVASC) 10 MG tablet Take 10 mg by  mouth daily. 04/19/20  Yes [provider]  acetaminophen (TYLENOL) 500 MG tablet Take 1,000 mg by mouth as needed.    [provider]  doxycycline (VIBRA-TABS) 100 MG tablet Take 100 mg by mouth 2 (two) times daily. 04/25/20   [provider]  gabapentin (NEURONTIN) 100 MG capsule Take 100 mg by mouth at bedtime. 01/16/20   [provider]  metoCLOPramide (REGLAN) 5 MG tablet Take 5 mg by mouth at bedtime. 04/29/20   [provider]  omeprazole (PRILOSEC) 40 MG  capsule Take 40 mg by mouth daily. 04/28/20   [provider]  tiZANidine (ZANAFLEX) 2 MG tablet Take 2 mg by mouth 2 (two) times daily. 03/30/20   [provider]  traMADol (ULTRAM) 50 MG tablet Take 50 mg by mouth 2 (two) times daily as needed. 04/06/20   [provider]      VITAL SIGNS:  Blood pressure 120/71, pulse (!) 109, temperature 98 F (36.7 C), temperature source Oral, resp. rate 18, height 5\' 9"  (1.753 m), weight 66.7 kg, SpO2 97 %.  PHYSICAL EXAMINATION:  GENERAL:  86 y.o.-year-old patient lying in the bed with no acute distress.  EYES: Pupils equal, round, reactive to light and accommodation. No scleral icterus.  HEENT: Head atraumatic, normocephalic. Oropharynx and nasopharynx clear.  NECK:  Supple, no jugular venous distention. No thyroid enlargement, no tenderness.  LUNGS: Normal breath sounds bilaterally, no wheezing, rales,rhonchi or crepitation. No use of accessory muscles of respiration.  CARDIOVASCULAR: S1, S2 normal. No murmurs, rubs, or gallops.  ABDOMEN: Soft, nontender, nondistended. Bowel sounds present. No organomegaly or mass.  EXTREMITIES: No pedal edema.  NEUROLOGIC: Cranial nerves II through XII are intact. Muscle strength 5/5 in all extremities. Sensation intact. Gait not checked.  PSYCHIATRIC: The patient is alert and oriented x 3.  SKIN: Left lower extremity erythema with scab.  LABORATORY PANEL:   CBC Recent Labs  Lab 04/30/20 1141  WBC 11.6*  HGB 13.7  HCT 41.7  PLT 222   ------------------------------------------------------------------------------------------------------------------  Chemistries  Recent Labs  Lab 04/30/20 1141  NA 149*  K 3.7  CL 111  CO2 26  GLUCOSE 164*  BUN 40*  CREATININE 1.53*  CALCIUM 10.2  AST 23  ALT 12  ALKPHOS 72  BILITOT 1.1   ------------------------------------------------------------------------------------------------------------------   RADIOLOGY:  CT Soft Tissue  Neck W Contrast  Result Date: 04/30/2020 CLINICAL DATA:  Cranial neuropathy, patient states cannot eat or drink for 2 days. EXAM: CT NECK WITH CONTRAST TECHNIQUE: Multidetector CT imaging of the neck was performed using the standard protocol following the bolus administration of intravenous contrast. CONTRAST:  59mL OMNIPAQUE IOHEXOL 300 MG/ML  SOLN COMPARISON:  Concurrent CT chest.  09/08/2002 CT neck. FINDINGS: Pharynx and larynx: Normal nasopharynx. No mass, focal swelling or abnormal enhancement. No significant airway narrowing. Dental amalgam beam hardening artifact limits evaluation of the oral cavity. Prominence of the right laryngeal ventricle. Symmetric appearance of the vocal folds. Salivary glands: No inflammation, mass, or stone. Thyroid: Normal. Lymph nodes: None enlarged or abnormal density. Vascular: Normal intravascular enhancement. Aortic arch and carotid bifurcation atherosclerotic calcifications. Approximately 50% luminal narrowing of the proximal left ICA. Limited intracranial: Grossly unremarkable. Visualized orbits: No gross abnormalities. Mastoids and visualized paranasal sinuses: Clear paranasal sinuses. No mastoid effusion. Skeleton: Multilevel spondylosis. Grade 1 C2-3 anterolisthesis. No acute osseous abnormality. Upper chest: Please see concurrent CT chest for findings below the thoracic inlet. Other: None. IMPRESSION: No acute finding on CT neck. 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 Please see concurrent CT chest for findings below thoracic inlet. Electronically Signed   By: Stana Bunting M.D.   On: 04/30/2020 13:44   CT Chest W Contrast  Result Date: 04/30/2020 CLINICAL DATA:  Dysphagia, mediastinal mass EXAM: CT CHEST WITH CONTRAST TECHNIQUE: Multidetector CT imaging of the chest was performed during intravenous contrast administration. CONTRAST:  40mL OMNIPAQUE IOHEXOL 300 MG/ML  SOLN COMPARISON:  07/13/2019 FINDINGS:  Cardiovascular: Aortic atherosclerosis. Cardiomegaly. Left coronary artery calcifications. No pericardial effusion. Mediastinum/Nodes: No enlarged mediastinal, hilar, or axillary lymph nodes. There is circumferential masslike soft tissue thickening of the lower third of the esophagus just above the gastroesophageal junction, difficult to accurately measure but involving a segment approximately 5 cm in length (series 12, image 77, series 2, image 114). Small hiatal hernia. The proximal esophagus is distended and fluid-filled. Lungs/Pleura: Moderate centrilobular emphysema. Mild dependent bibasilar scarring and or atelectasis. Stable, benign 3 mm nodule of the right pulmonary apex (series 8, image 30). No pleural effusion or pneumothorax. Upper Abdomen: No acute abnormality. Musculoskeletal: No chest wall mass or suspicious bone lesions identified. Wedge deformity of the T6 vertebral body. IMPRESSION: 1. There is circumferential masslike soft tissue thickening of the lower third of the esophagus just above the gastroesophageal junction. The proximal esophagus is distended and fluid-filled. Findings are highly concerning for esophageal malignancy. 2. No evident lymphadenopathy or evidence of metastatic disease in the chest. 3. Emphysema (ICD10-J43.9). 4. Coronary artery disease.  Aortic Atherosclerosis (ICD10-I70.0). Electronically Signed   By: Lauralyn Primes M.D.   On: 04/30/2020 13:27    EKG:   EKG ordered by me.  IMPRESSION AND PLAN:   1.  Esophageal mass and difficulty swallowing with spitting up.  ER physician spoke with Dr. Norma Fredrickson gastroenterology to do an endoscopy for tomorrow.  May end up needing a PEG tube also. 2.  Acute kidney injury with creatinine up at 1.53.  IV fluid hydration with normal saline plus potassium. 3.  Hyponatremia.  Sodium up to 149.  IV fluid hydration. 4.  Essential hypertension.  Blood pressure stable will hold Norvasc for right now. 5.  Partially treated cellulitis left  lower extremity on oral doxycycline.  With the difficulty swallowing I will switch over to IV Ancef. 6.  GERD we will give IV Protonix 7.  Neuropathy on gabapentin  All the records, laboratory and radiological data are reviewed and case discussed with ED provider. Management plans discussed with the patient, family and they are in agreement.  Patient requires inpatient hospital stay because the patient will need to have a endoscopy and then patient will need to see if he can swallow and keep up with his nutritional needs.  May end up needing a PEG tube also.  This will require 2 midnight stay in the hospital.  CODE STATUS: full code  TOTAL TIME TAKING CARE OF THIS PATIENT: 50 minutes.    Alford Highland M.D on 04/30/2020 at 2:52 PM  Between 7am to 6pm - Pager - [phone]  After 6pm call admission pager [phone]  [facility]  CC: Primary care physician; Danella Penton, MD

## 2020-04-30 NOTE — ED Provider Notes (Signed)
[facility] Emergency Department Provider Note   ____________________________________________   First MD Initiated Contact with Patient 04/30/20 1144     (approximate)  I have reviewed the triage vital signs and the nursing notes.   HISTORY  Chief Complaint Emesis    HPI Koren Sermersheim. is a 86 y.o. male with past medical history of hypertension who presents to the ED complaining of difficulty swallowing.  Patient reports that for the past 2 days he has had difficulty swallowing both liquids and solids.  He feels like solids get stuck when he has attempted, he will then spit them back up.  He does not feel like there is any food stuck in his throat, but has noticed even when he tries to drink water it seems to get stuck and he has to spit it back up.  He now states he is starting to feel weak and dehydrated.  He denies any pain in his throat or chest, denies similar issues in the past.  He states he has been able to swallow his spit without difficulty.        Past Medical History:  Diagnosis Date  . Complete tear of left rotator cuff 07/21/2014  . Essential hypertension 04/19/2014  . Impingement syndrome of left shoulder 07/21/2014  . Left hip pain 02/08/2016  . Left supraspinatus tenosynovitis 07/21/2014  . Pure hypercholesterolemia 04/19/2014    Patient Active Problem List   Diagnosis Date Noted  . 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 Oral phase dysphagia 05/22/2016  . Esophageal dysphagia 05/22/2016  . Left hip pain 02/08/2016  . Complete tear of left rotator cuff 07/21/2014  . Impingement syndrome of left shoulder 07/21/2014  . Left supraspinatus tenosynovitis 07/21/2014  . Essential hypertension 04/19/2014  . Pure hypercholesterolemia 04/19/2014    Past Surgical History:  Procedure Laterality Date  . ELBOW SURGERY  1995  . ESOPHAGOGASTRODUODENOSCOPY (EGD) WITH PROPOFOL N/A 05/31/2016   Procedure: ESOPHAGOGASTRODUODENOSCOPY (EGD) WITH PROPOFOL;   Surgeon: Wyline Mood, MD;  Location: [facility] ENDOSCOPY;  Service: Endoscopy;  Laterality: N/A;  . KYPHOPLASTY N/A 07/30/2019   Procedure: L1 KYPHOPLASTY;  Surgeon: Kennedy Bucker, MD;  Location: [facility] ORS;  Service: Orthopedics;  Laterality: N/A;    Prior to Admission medications   Medication Sig Start Date End Date Taking? Authorizing Provider  acetaminophen (TYLENOL) 500 MG tablet Take 1,000 mg by mouth as needed.    [provider]  amLODipine (NORVASC) 10 MG tablet Take 10 mg by mouth daily. 04/19/20   [provider]  doxycycline (VIBRA-TABS) 100 MG tablet Take 100 mg by mouth 2 (two) times daily. 04/25/20   [provider]  gabapentin (NEURONTIN) 100 MG capsule Take 100 mg by mouth at bedtime. 01/16/20   [provider]  metoCLOPramide (REGLAN) 5 MG tablet Take 5 mg by mouth at bedtime. 04/29/20   [provider]  omeprazole (PRILOSEC) 40 MG capsule Take 40 mg by mouth daily. 04/28/20   [provider]  tiZANidine (ZANAFLEX) 2 MG tablet Take 2 mg by mouth 2 (two) times daily. 03/30/20   [provider]  traMADol (ULTRAM) 50 MG tablet Take 50 mg by mouth 2 (two) times daily as needed. 04/06/20   [provider]    Allergies Ace inhibitors, Clarithromycin, Etodolac, and Levofloxacin  Family History  Problem Relation Age of Onset  . Pancreatic cancer Mother     Social History Social History   Tobacco Use  . Smoking status: Former Games developer  . Smokeless  tobacco: Never Used  Substance Use Topics  . Alcohol use: Yes    Comment: social  . Drug use: No    Review of Systems  Constitutional: No fever/chills Eyes: No visual changes. ENT: No sore throat. Cardiovascular: Denies chest pain. Respiratory: Denies shortness of breath. Gastrointestinal: No abdominal pain.  No nausea, no vomiting.  No diarrhea.  No constipation.  Positive for dysphagia. Genitourinary: Negative for dysuria. Musculoskeletal: Negative for back  pain. Skin: Negative for rash. Neurological: Negative for headaches, focal weakness or numbness.  ____________________________________________   PHYSICAL EXAM:  VITAL SIGNS: ED Triage Vitals  Enc Vitals Group     BP 04/30/20 1136 120/71     Pulse Rate 04/30/20 1136 (!) 109     Resp 04/30/20 1136 18     Temp 04/30/20 1137 98 F (36.7 C)     Temp Source 04/30/20 1137 Oral     SpO2 04/30/20 1136 97 %     Weight 04/30/20 1136 147 lb (66.7 kg)     Height 04/30/20 1136 5\' 9"  (1.753 m)     Head Circumference --      Peak Flow --      Pain Score 04/30/20 1136 0     Pain Loc --      Pain Edu? --      Excl. in GC? --     Constitutional: Alert and oriented. Eyes: Conjunctivae are normal. Head: Atraumatic. Nose: No congestion/rhinnorhea. Mouth/Throat: Mucous membranes are moist.  Oropharynx clear. Neck: Normal ROM Cardiovascular: Normal rate, regular rhythm. Grossly normal heart sounds. Respiratory: Normal respiratory effort.  No retractions. Lungs CTAB. Gastrointestinal: Soft and nontender. No distention. Genitourinary: deferred Musculoskeletal: No lower extremity tenderness nor edema. Neurologic:  Normal speech and language. No gross focal neurologic deficits are appreciated. Skin:  Skin is warm, dry and intact. No rash noted. Psychiatric: Mood and affect are normal. Speech and behavior are normal.  ____________________________________________   LABS (all labs ordered are listed, but only abnormal results are displayed)  Labs Reviewed  COMPREHENSIVE METABOLIC PANEL - Abnormal; Notable for the following components:      Result Value   Sodium 149 (*)    Glucose, Bld 164 (*)    BUN 40 (*)    Creatinine, Ser 1.53 (*)    GFR, Estimated 39 (*)    All other components within normal limits  CBC - Abnormal; Notable for the following components:   WBC 11.6 (*)    All other components within normal limits  URINALYSIS, COMPLETE (UACMP) WITH MICROSCOPIC - Abnormal; Notable for  the following components:   Color, Urine YELLOW (*)    APPearance HAZY (*)    Protein, ur 100 (*)    All other components within normal limits  RESPIRATORY PANEL BY RT PCR (FLU A&B, COVID)  LIPASE, BLOOD    PROCEDURES  Procedure(s) performed (including Critical Care):  Procedures   ____________________________________________   INITIAL IMPRESSION / ASSESSMENT AND PLAN / ED COURSE       86 year old male with possible history of hypertension and hyperlipidemia who presents to the ED complaining of difficulty swallowing over the past 2 days with difficulty tolerating even liquids.  Lab work is concerning for dehydration with elevated BUN to creatinine ratio as well as mild hyponatremia.  Patient was hydrated with IV fluids and was observed attempting to drink some water, which she promptly spit back up.  He continues to be able to tolerate his secretions without difficulty.  Case was discussed with Dr.  Toledo from GI and decision was made to proceed with CT chest.  This showed a mass in the distal third of his esophagus with more proximal esophagus distended and fluid-filled.  Finding is concerning for primary esophageal cancer and patient to be evaluated by GI.  Tentative plan for endoscopy tomorrow, case discussed with hospitalist for admission.      ____________________________________________   FINAL CLINICAL IMPRESSION(S) / ED DIAGNOSES  Final diagnoses:  Dysphagia  Esophageal mass     ED Discharge Orders    None       Note:  This document was prepared using Dragon voice recognition software and may include unintentional dictation errors.   Chesley Noon, MD 04/30/20 1425

## 2020-04-30 NOTE — ED Notes (Signed)
Iv dressing changed and cleaned at this time

## 2020-04-30 NOTE — ED Triage Notes (Signed)
Pt states that he cannot eat or drink anything d/t spitting it all back up- pt has been having this problem for about 2 days and has now started feeling weak for not being able to eat

## 2020-05-01 ENCOUNTER — Encounter
Admission: EM | Disposition: A | Discharge status: Home / Self Care | Payer: Self-pay | Source: Home / Self Care | Attending: Emergency Medicine

## 2020-05-01 ENCOUNTER — Inpatient Hospital Stay: Payer: Medicare Other | Admitting: Anesthesiology

## 2020-05-01 ENCOUNTER — Encounter: Payer: Self-pay | Admitting: Internal Medicine

## 2020-05-01 DIAGNOSIS — E876 Hypokalemia: Secondary | ICD-10-CM

## 2020-05-01 DIAGNOSIS — E870 Hyperosmolality and hypernatremia: Secondary | ICD-10-CM | POA: Diagnosis not present

## 2020-05-01 DIAGNOSIS — K2210 Ulcer of esophagus without bleeding: Secondary | ICD-10-CM | POA: Diagnosis present

## 2020-05-01 DIAGNOSIS — N179 Acute kidney failure, unspecified: Secondary | ICD-10-CM | POA: Diagnosis not present

## 2020-05-01 DIAGNOSIS — I480 Paroxysmal atrial fibrillation: Secondary | ICD-10-CM

## 2020-05-01 HISTORY — PX: ESOPHAGOGASTRODUODENOSCOPY: SHX5428

## 2020-05-01 LAB — BASIC METABOLIC PANEL
Anion gap: 9 (ref 5–15)
BUN: 24 mg/dL — ABNORMAL HIGH (ref 8–23)
CO2: 28 mmol/L (ref 22–32)
Calcium: 9.4 mg/dL (ref 8.9–10.3)
Chloride: 108 mmol/L (ref 98–111)
Creatinine, Ser: 1.03 mg/dL (ref 0.61–1.24)
GFR, Estimated: >60 mL/min (ref >60)
Glucose, Bld: 97 mg/dL (ref 70–99)
Potassium: 3.4 mmol/L — ABNORMAL LOW (ref 3.5–5.1)
Sodium: 145 mmol/L (ref 135–145)

## 2020-05-01 LAB — CBC
HCT: 42 % (ref 39.0–52.0)
Hemoglobin: 14 g/dL (ref 13.0–17.0)
MCH: 31.8 pg (ref 26.0–34.0)
MCHC: 33.3 g/dL (ref 30.0–36.0)
MCV: 95.5 fL (ref 80.0–100.0)
Platelets: 194 10*3/uL (ref 150–400)
RBC: 4.4 MIL/uL (ref 4.22–5.81)
RDW: 13.7 % (ref 11.5–15.5)
WBC: 9.7 10*3/uL (ref 4.0–10.5)
nRBC: 0 % (ref 0.0–0.2)

## 2020-05-01 SURGERY — EGD (ESOPHAGOGASTRODUODENOSCOPY)
Anesthesia: General

## 2020-05-01 MED ORDER — ROCURONIUM BROMIDE 10 MG/ML (PF) SYRINGE
PREFILLED_SYRINGE | INTRAVENOUS | Status: AC
  Filled 2020-05-01: qty 10

## 2020-05-01 MED ORDER — LIDOCAINE HCL (PF) 2 % IJ SOLN
INTRAMUSCULAR | Status: AC
  Filled 2020-05-01: qty 5

## 2020-05-01 MED ORDER — CEPHALEXIN 500 MG PO CAPS: 500.0000 mg | ORAL_CAPSULE | Freq: Two times a day (BID) | ORAL | 0 refills | Status: AC

## 2020-05-01 MED ORDER — PROPOFOL 500 MG/50ML IV EMUL
INTRAVENOUS | Status: DC | PRN
  Administered 2020-05-01: 145 ug/kg/min via INTRAVENOUS

## 2020-05-01 MED ORDER — SUCCINYLCHOLINE CHLORIDE 200 MG/10ML IV SOSY
PREFILLED_SYRINGE | INTRAVENOUS | Status: AC
  Filled 2020-05-01: qty 10

## 2020-05-01 MED ORDER — PROPOFOL 10 MG/ML IV BOLUS
INTRAVENOUS | Status: AC
  Filled 2020-05-01: qty 20

## 2020-05-01 MED ORDER — METOPROLOL SUCCINATE ER 25 MG PO TB24
25.0000 mg | ORAL_TABLET | Freq: Every day | ORAL | Status: DC
  Administered 2020-05-01: 15:00:00 25 mg via ORAL
  Filled 2020-05-01: qty 1

## 2020-05-01 MED ORDER — GLYCOPYRROLATE 0.2 MG/ML IJ SOLN
INTRAMUSCULAR | Status: AC
  Filled 2020-05-01: qty 1

## 2020-05-01 MED ORDER — ONDANSETRON HCL 4 MG/2ML IJ SOLN
INTRAMUSCULAR | Status: AC
  Filled 2020-05-01: qty 4

## 2020-05-01 MED ORDER — PANTOPRAZOLE SODIUM 40 MG PO TBEC
40.0000 mg | DELAYED_RELEASE_TABLET | Freq: Every day | ORAL | Status: DC
  Administered 2020-05-01: 40 mg via ORAL
  Filled 2020-05-01: qty 1

## 2020-05-01 MED ORDER — LACTATED RINGERS IV SOLN: INTRAVENOUS | Status: DC

## 2020-05-01 MED ORDER — METOPROLOL SUCCINATE ER 25 MG PO TB24
25.0000 mg | ORAL_TABLET | Freq: Every day | ORAL | 0 refills | Status: DC
Start: 2020-05-01 — End: 2021-06-29

## 2020-05-01 MED ORDER — SODIUM CHLORIDE 0.9 % IV SOLN: INTRAVENOUS | Status: DC

## 2020-05-01 MED ORDER — SUGAMMADEX SODIUM 500 MG/5ML IV SOLN
INTRAVENOUS | Status: AC
  Filled 2020-05-01: qty 5

## 2020-05-01 MED ORDER — PROPOFOL 10 MG/ML IV BOLUS
INTRAVENOUS | Status: DC | PRN
  Administered 2020-05-01: 20 mg via INTRAVENOUS
  Administered 2020-05-01: 40 mg via INTRAVENOUS

## 2020-05-01 MED ORDER — DEXMEDETOMIDINE (PRECEDEX) IN NS 20 MCG/5ML (4 MCG/ML) IV SYRINGE
PREFILLED_SYRINGE | INTRAVENOUS | Status: AC
  Filled 2020-05-01: qty 5

## 2020-05-01 MED ORDER — DEXAMETHASONE SODIUM PHOSPHATE 10 MG/ML IJ SOLN
INTRAMUSCULAR | Status: AC
  Filled 2020-05-01: qty 1

## 2020-05-01 MED ORDER — LIDOCAINE HCL (CARDIAC) PF 100 MG/5ML IV SOSY
PREFILLED_SYRINGE | INTRAVENOUS | Status: DC | PRN
  Administered 2020-05-01: 100 mg via INTRAVENOUS

## 2020-05-01 MED ORDER — CEFAZOLIN SODIUM-DEXTROSE 1-4 GM/50ML-% IV SOLN
1.0000 g | Freq: Three times a day (TID) | INTRAVENOUS | Status: DC
  Administered 2020-05-01: 12:00:00 1 g via INTRAVENOUS
  Filled 2020-05-01 (×4): qty 50

## 2020-05-01 MED ORDER — PANTOPRAZOLE SODIUM 40 MG PO TBEC
40.0000 mg | DELAYED_RELEASE_TABLET | Freq: Every day | ORAL | 0 refills | Status: DC
Start: 2020-05-01 — End: 2021-06-29

## 2020-05-01 MED ORDER — PHENYLEPHRINE HCL (PRESSORS) 10 MG/ML IV SOLN
INTRAVENOUS | Status: DC | PRN
  Administered 2020-05-01: 100 ug via INTRAVENOUS
  Administered 2020-05-01: 200 ug via INTRAVENOUS

## 2020-05-01 NOTE — Care Management CC44 (Signed)
Condition Code 44 Documentation Completed  Patient Details  Name: Devon Elliott. MRN: [id number] Date of Birth: 03-02-1927   Condition Code 44 given:  Yes Patient signature on Condition Code 44 notice:    Documentation of 2 MD's agreement:    Code 44 added to claim:       Delphia Grates, LCSW 05/01/2020, 1:31 PM

## 2020-05-01 NOTE — Progress Notes (Signed)
Pt has been discharged home.  Discharge papers given and explained to pt.  Pt verbalized understanding.  Meds and f/u appointments reviewed. Rx sent electronically to the pharmacy.  Pt made aware.

## 2020-05-01 NOTE — Anesthesia Procedure Notes (Signed)
Procedure Name: General with mask airway Performed by: Fletcher-Harrison, Kayonna Lawniczak, CRNA Pre-anesthesia Checklist: Patient identified, Emergency Drugs available, Suction available and Patient being monitored Patient Re-evaluated:Patient Re-evaluated prior to induction Oxygen Delivery Method: Simple face mask Induction Type: IV induction Placement Confirmation: positive ETCO2 and CO2 detector Dental Injury: Teeth and Oropharynx as per pre-operative assessment        

## 2020-05-01 NOTE — Op Note (Addendum)
[facility] Gastroenterology Patient Name: Devon Elliott Procedure Date: 05/01/2020 7:12 AM MRN: [id number] Account #: [id number] Date of Birth: 04/08/1927 Admit Type: Inpatient Age: 86 Room: [facility] ENDO ROOM 2 Gender: Male Note Status: Finalized Procedure:             Upper GI endoscopy Indications:           Dysphagia, Abnormal CT of the GI tract Providers:             Boykin Nearing. Norma Fredrickson MD, MD Referring MD:          Marylin Crosby. Greggory Stallion MD, MD (Referring MD) Medicines:             Propofol per Anesthesia Complications:         No immediate complications. Estimated blood loss: None. Procedure:             Pre-Anesthesia Assessment:                        - The risks and benefits of the procedure and the                         sedation options and risks were discussed with the                         patient. All questions were answered and informed                         consent was obtained.                        - Patient identification and proposed procedure were                         verified prior to the procedure by the nurse. The                         procedure was verified in the procedure room.                        - ASA Grade Assessment: III - A patient with severe                         systemic disease.                        - After reviewing the risks and benefits, the patient                         was deemed in satisfactory condition to undergo the                         procedure.                        After obtaining informed consent, the endoscope was                         passed under direct vision. Throughout the procedure,  the patient's blood pressure, pulse, and oxygen                         saturations were monitored continuously. The Endoscope                         was introduced through the mouth, and advanced to the                         third part of duodenum. The upper GI endoscopy was                          accomplished without difficulty. The patient tolerated                         the procedure well. Findings:      One cratered esophageal ulcer with no stigmata of recent bleeding was       found at the gastroesophageal junction. The lesion was 9 mm in largest       dimension. Biopsies were taken with a cold forceps for histology.      There is no endoscopic evidence of stenosis, stricture, mass or polyps       in the entire esophagus.      Patchy mild inflammation characterized by erythema was found in the       gastric antrum. Biopsies were taken with a cold forceps for Helicobacter       pylori testing.      The cardia and gastric fundus were normal on retroflexion.      Patchy moderately erythematous mucosa without active bleeding and with       no stigmata of bleeding was found in the duodenal bulb.      The second portion of the duodenum and third portion of the duodenum       were normal.      The exam was otherwise without abnormality. Impression:            - Esophageal ulcer with no stigmata of recent                         bleeding. Biopsied.                        - Gastritis. Biopsied.                        - Erythematous duodenopathy.                        - Normal second portion of the duodenum and third                         portion of the duodenum.                        - The examination was otherwise normal. Recommendation:        - Return patient to hospital ward for possible                         discharge same day.                        -  Mechanical soft diet today.                        - Advance diet as tolerated for 3 days, then advance                         as tolerated to resume regular diet.                        - Use Protonix (pantoprazole) 40 mg PO daily daily.                        - Await pathology results.                        - Return to my office in 3 weeks.                        - The findings and recommendations were  discussed with                         the patient. Procedure Code(s):     --- Professional ---                        [phone], Esophagogastroduodenoscopy, flexible,                         transoral; with biopsy, single or multiple Diagnosis Code(s):     --- Professional ---                        R93.3, Abnormal findings on diagnostic imaging of                         other parts of digestive tract                        R13.10, Dysphagia, unspecified                        K31.89, Other diseases of stomach and duodenum                        K29.70, Gastritis, unspecified, without bleeding                        K22.10, Ulcer of esophagus without bleeding CPT copyright 2019 American Medical Association. All rights reserved. The codes documented in this report are preliminary and upon coder review may  be revised to meet current compliance requirements. Stanton Kidney MD, MD 05/01/2020 9:53:20 AM This report has been signed electronically. Number of Addenda: 0 Note Initiated On: 05/01/2020 7:12 AM Estimated Blood Loss:  Estimated blood loss: none.      [facility]

## 2020-05-01 NOTE — Anesthesia Postprocedure Evaluation (Signed)
Anesthesia Post Note  Patient: Keymon Mcelroy.  Procedure(s) Performed: ESOPHAGOGASTRODUODENOSCOPY (EGD) (N/A )  Patient location during evaluation: Endoscopy Anesthesia Type: General Level of consciousness: awake and alert Pain management: pain level controlled Vital Signs Assessment: post-procedure vital signs reviewed and stable Respiratory status: spontaneous breathing, nonlabored ventilation, respiratory function stable and patient connected to nasal cannula oxygen Cardiovascular status: blood pressure returned to baseline and stable Postop Assessment: no apparent nausea or vomiting Anesthetic complications: no   No complications documented.   Last Vitals:  Vitals:   05/01/20 1003 05/01/20 1012  BP: 108/80 111/78  Pulse:    Resp: 16 15  Temp:    SpO2: 100% 96%    Last Pain:  Vitals:   05/01/20 1012  TempSrc:   PainSc: 0-No pain                 Cleda Mccreedy Vanna Shavers

## 2020-05-01 NOTE — Progress Notes (Signed)
PHARMACY NOTE:  ANTIMICROBIAL RENAL DOSAGE ADJUSTMENT  Current antimicrobial regimen includes a mismatch between antimicrobial dosage and estimated renal function.  As per policy approved by the Pharmacy & Therapeutics and Medical Executive Committees, the antimicrobial dosage will be adjusted accordingly.  Current antimicrobial dosage:  Cefazolin 1 g IV q12h  Indication: cellulitis left lower extremity   Renal Function:  Estimated Creatinine Clearance: 41.5 mL/min (by C-G formula based on SCr of 1.03 mg/dL).  Antimicrobial dosage has been changed to:  Cefazolin 1g IV q8h    Thank you for allowing pharmacy to be a part of this patient's care.  Marty Heck, [facility] 05/01/2020 11:09 AM

## 2020-05-01 NOTE — Discharge Summary (Signed)
[facility] [place] [facility]   PATIENT NAME: Devon Elliott    MR#:  [id number]  DATE OF BIRTH:  05-02-1927  DATE OF ADMISSION:  04/30/2020 ADMITTING PHYSICIAN: Alford Highland, MD  DATE OF DISCHARGE: 05/01/2020  PRIMARY CARE PHYSICIAN: Danella Penton, MD    ADMISSION DIAGNOSIS:  Esophageal mass [K22.89] Dysphagia [R13.10] Esophageal ulcer [K22.10]  DISCHARGE DIAGNOSIS:  Esophageal ulcer and esophageal gastric ulcer.  SECONDARY DIAGNOSIS:   Past Medical History:  Diagnosis Date  . Complete tear of left rotator cuff 07/21/2014  . Essential hypertension 04/19/2014  . Impingement syndrome of left shoulder 07/21/2014  . Left hip pain 02/08/2016  . Left supraspinatus tenosynovitis 07/21/2014  . Pure hypercholesterolemia 04/19/2014    HOSPITAL COURSE:   1.  Esophageal ulcer and gastroesophageal ulcer.  The patient was admitted to the hospital with suspected esophageal mass seen on CT scan of the chest.  No esophageal mass was seen on endoscopy only ulcerations.  The patient was on IV Protonix and recommended switching omeprazole over to Protonix and following up with GI as outpatient.  Patient was advised not to eat all the tomatoes that he has been eating at home.  Bland diet.  The patient was able to eat a soft diet upon going home.  Of note the patient was recently started on doxycycline for a left lower extremity wound.  I will stop doxycycline also. 2.  Acute kidney injury with creatinine up at 1.53 on presentation.  The patient was given IV fluids during the hospital course.  Creatinine came down to 1.03 upon disposition. 3.  Hypernatremia and hypokalemia.  The patient was on IV fluids with potassium and sodium normalized with IV fluid hydration.  Since the patient was able to tolerate diet he should be able to keep up with his electrolytes as outpatient. 4.  Essential hypertension.  Blood pressure on the lower side will hold Norvasc for right now. 5.  Partially  treated cellulitis left lower extremity.  Stop doxycycline with esophageal ulcer.  Give a few days of Keflex upon disposition.  Here in the hospital I did give IV Ancef. 6.  GERD on IV Protonix while here and oral Protonix upon going home 7.  Admitting EKG showed atrial fibrillation.  Patient feeling okay.  We will give low-dose Toprol.  Follow-up with Dr. Bethann Punches as outpatient.  Likely initially secondary to dehydration.  Hold off on any anticoagulation secondary to esophageal ulcers.   DISCHARGE CONDITIONS:   Satisfactory  CONSULTS OBTAINED:  Treatment Team:  Stanton Kidney, MD  DRUG ALLERGIES:   Allergies  Allergen Reactions  . Ace Inhibitors     Other reaction(s): Kidney Disorder  . Clarithromycin Hives  . Etodolac     Other reaction(s): Kidney Disorder  . Levofloxacin Swelling    DISCHARGE MEDICATIONS:   Allergies as of 05/01/2020      Reactions   Ace Inhibitors    Other reaction(s): Kidney Disorder   Clarithromycin Hives   Etodolac    Other reaction(s): Kidney Disorder   Levofloxacin Swelling      Medication List    STOP taking these medications   amLODipine 10 MG tablet Commonly known as: NORVASC   doxycycline 100 MG tablet Commonly known as: VIBRA-TABS   gabapentin 100 MG capsule Commonly known as: NEURONTIN   omeprazole 40 MG capsule Commonly known as: PRILOSEC     TAKE these medications   acetaminophen 500 MG tablet Commonly known as: TYLENOL Take 1,000 mg  by mouth as needed.   cephALEXin 500 MG capsule Commonly known as: KEFLEX Take 1 capsule (500 mg total) by mouth 2 (two) times daily for 3 days.   metoCLOPramide 5 MG tablet Commonly known as: REGLAN Take 5 mg by mouth at bedtime.   metoprolol succinate 25 MG 24 hr tablet Commonly known as: TOPROL-XL Take 1 tablet (25 mg total) by mouth daily.   pantoprazole 40 MG tablet Commonly known as: PROTONIX Take 1 tablet (40 mg total) by mouth daily.   tiZANidine 2 MG  tablet Commonly known as: ZANAFLEX Take 2 mg by mouth 2 (two) times daily.   traMADol 50 MG tablet Commonly known as: ULTRAM Take 50 mg by mouth 2 (two) times daily as needed.        DISCHARGE INSTRUCTIONS:   Follow-up PMD 5 days Follow-up Dr. Norma Fredrickson gastroenterology 3 weeks  If you experience worsening of your admission symptoms, develop shortness of breath, life threatening emergency, suicidal or homicidal thoughts you must seek medical attention immediately by calling 911 or calling your MD immediately  if symptoms less severe.  You Must read complete instructions/literature along with all the possible adverse reactions/side effects for all the Medicines you take and that have been prescribed to you. Take any new Medicines after you have completely understood and accept all the possible adverse reactions/side effects.   Please note  You were cared for by a hospitalist during your hospital stay. If you have any questions about your discharge medications or the care you received while you were in the hospital after you are discharged, you can call the unit and asked to speak with the hospitalist on call if the hospitalist that took care of you is not available. Once you are discharged, your primary care physician will handle any further medical issues. Please note that NO REFILLS for any discharge medications will be authorized once you are discharged, as it is imperative that you return to your primary care physician (or establish a relationship with a primary care physician if you do not have one) for your aftercare needs so that they can reassess your need for medications and monitor your lab values.    Today   CHIEF COMPLAINT:   Chief Complaint  Patient presents with  . Emesis    HISTORY OF PRESENT ILLNESS:  Devon Elliott  is a 86 y.o. male came in with not being able to eat over the last couple days   VITAL SIGNS:  Blood pressure (!) 138/100, pulse (!) 115,  temperature 98.5 F (36.9 C), temperature source Oral, resp. rate 16, height 5\' 9"  (1.753 m), weight 62.8 kg, SpO2 94 %. Previous blood pressure 111/78. Nurse will check vital signs again. I/O:    Intake/Output Summary (Last 24 hours) at 05/01/2020 1425 Last data filed at 05/01/2020 1351 Gross per 24 hour  Intake 390.62 ml  Output 1100 ml  Net -709.38 ml    PHYSICAL EXAMINATION:  GENERAL:  86 y.o.-year-old patient lying in the bed with no acute distress.  EYES: Pupils equal, round, reactive to light and accommodation. No scleral icterus. Extraocular muscles intact.  HEENT: Head atraumatic, normocephalic. Oropharynx and nasopharynx clear.  LUNGS: Normal breath sounds bilaterally, no wheezing, rales,rhonchi or crepitation. No use of accessory muscles of respiration.  CARDIOVASCULAR: S1, S2 normal. No murmurs, rubs, or gallops.  ABDOMEN: Soft, non-tender, non-distended.  EXTREMITIES: No pedal edema.  NEUROLOGIC: Cranial nerves II through XII are intact. Muscle strength 5/5 in all extremities. Sensation intact. Gait not  checked.  PSYCHIATRIC: The patient is alert and answers questions appropriately.  SKIN: No obvious rash, lesion, or ulcer.   DATA REVIEW:   CBC Recent Labs  Lab 05/01/20 0645  WBC 9.7  HGB 14.0  HCT 42.0  PLT 194    Chemistries  Recent Labs  Lab 04/30/20 1141 04/30/20 1141 05/01/20 0645  NA 149*   < > 145  K 3.7   < > 3.4*  CL 111   < > 108  CO2 26   < > 28  GLUCOSE 164*   < > 97  BUN 40*   < > 24*  CREATININE 1.53*   < > 1.03  CALCIUM 10.2   < > 9.4  AST 23  --   --   ALT 12  --   --   ALKPHOS 72  --   --   BILITOT 1.1  --   --    < > = values in this interval not displayed.    Microbiology Results  Results for orders placed or performed during the hospital encounter of 04/30/20  Respiratory Panel by RT PCR (Flu A&B, Covid) - Nasopharyngeal Swab     Status: None   Collection Time: 04/30/20  1:49 PM   Specimen: Nasopharyngeal Swab  Result  Value Ref Range Status   SARS Coronavirus 2 by RT PCR NEGATIVE NEGATIVE Final    Comment: (NOTE) SARS-CoV-2 target nucleic acids are NOT DETECTED.  The SARS-CoV-2 RNA is generally detectable in upper respiratoy specimens during the acute phase of infection. The lowest concentration of SARS-CoV-2 viral copies this assay can detect is 131 copies/mL. A negative result does not preclude SARS-Cov-2 infection and should not be used as the sole basis for treatment or other patient management decisions. A negative result may occur with  improper specimen collection/handling, submission of specimen other than nasopharyngeal swab, presence of viral mutation(s) within the areas targeted by this assay, and inadequate number of viral copies (<131 copies/mL). A negative result must be combined with clinical observations, patient history, and epidemiological information. The expected result is Negative.  Fact Sheet for Patients:  https://www.moore.com/  Fact Sheet for Healthcare Providers:  https://www.young.biz/  This test is no t yet approved or cleared by the [place] FDA and  has been authorized for detection and/or diagnosis of SARS-CoV-2 by FDA under an Emergency Use Authorization (EUA). This EUA will remain  in effect (meaning this test can be used) for the duration of the COVID-19 declaration under Section 564(b)(1) of the Act, 21 U.S.C. section 360bbb-3(b)(1), unless the authorization is terminated or revoked sooner.     Influenza A by PCR NEGATIVE NEGATIVE Final   Influenza B by PCR NEGATIVE NEGATIVE Final    Comment: (NOTE) The Xpert Xpress SARS-CoV-2/FLU/RSV assay is intended as an aid in  the diagnosis of influenza from Nasopharyngeal swab specimens and  should not be used as a sole basis for treatment. Nasal washings and  aspirates are unacceptable for Xpert Xpress SARS-CoV-2/FLU/RSV  testing.  Fact Sheet for  Patients: https://www.moore.com/  Fact Sheet for Healthcare Providers: https://www.young.biz/  This test is not yet approved or cleared by the [place] FDA and  has been authorized for detection and/or diagnosis of SARS-CoV-2 by  FDA under an Emergency Use Authorization (EUA). This EUA will remain  in effect (meaning this test can be used) for the duration of the  Covid-19 declaration under Section 564(b)(1) of the Act, 21  U.S.C. section 360bbb-3(b)(1), unless the authorization is  terminated or revoked. Performed at [facility], [address] [place], [place] [address]     RADIOLOGY:  CT Soft Tissue Neck W Contrast  Result Date: 04/30/2020 CLINICAL DATA:  Cranial neuropathy, patient states cannot eat or drink for 2 days. EXAM: CT NECK WITH CONTRAST TECHNIQUE: Multidetector CT imaging of the neck was performed using the standard protocol following the bolus administration of intravenous contrast. CONTRAST:  12mL OMNIPAQUE IOHEXOL 300 MG/ML  SOLN COMPARISON:  Concurrent CT chest.  09/08/2002 CT neck. FINDINGS: Pharynx and larynx: Normal nasopharynx. No mass, focal swelling or abnormal enhancement. No significant airway narrowing. Dental amalgam beam hardening artifact limits evaluation of the oral cavity. Prominence of the right laryngeal ventricle. Symmetric appearance of the vocal folds. Salivary glands: No inflammation, mass, or stone. Thyroid: Normal. Lymph nodes: None enlarged or abnormal density. Vascular: Normal intravascular enhancement. Aortic arch and carotid bifurcation atherosclerotic calcifications. Approximately 50% luminal narrowing of the proximal left ICA. Limited intracranial: Grossly unremarkable. Visualized orbits: No gross abnormalities. Mastoids and visualized paranasal sinuses: Clear paranasal sinuses. No mastoid effusion. Skeleton: Multilevel spondylosis. Grade 1 C2-3 anterolisthesis. No acute osseous abnormality.  Upper chest: Please see concurrent CT chest for findings below the thoracic inlet. Other: None. IMPRESSION: No acute finding on CT neck. 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 Please see concurrent CT chest for findings below thoracic inlet. Electronically Signed   By: Stana Bunting M.D.   On: 04/30/2020 13:44   CT Chest W Contrast  Result Date: 04/30/2020 CLINICAL DATA:  Dysphagia, mediastinal mass EXAM: CT CHEST WITH CONTRAST TECHNIQUE: Multidetector CT imaging of the chest was performed during intravenous contrast administration. CONTRAST:  29mL OMNIPAQUE IOHEXOL 300 MG/ML  SOLN COMPARISON:  07/13/2019 FINDINGS: Cardiovascular: Aortic atherosclerosis. Cardiomegaly. Left coronary artery calcifications. No pericardial effusion. Mediastinum/Nodes: No enlarged mediastinal, hilar, or axillary lymph nodes. There is circumferential masslike soft tissue thickening of the lower third of the esophagus just above the gastroesophageal junction, difficult to accurately measure but involving a segment approximately 5 cm in length (series 12, image 77, series 2, image 114). Small hiatal hernia. The proximal esophagus is distended and fluid-filled. Lungs/Pleura: Moderate centrilobular emphysema. Mild dependent bibasilar scarring and or atelectasis. Stable, benign 3 mm nodule of the right pulmonary apex (series 8, image 30). No pleural effusion or pneumothorax. Upper Abdomen: No acute abnormality. Musculoskeletal: No chest wall mass or suspicious bone lesions identified. Wedge deformity of the T6 vertebral body. IMPRESSION: 1. There is circumferential masslike soft tissue thickening of the lower third of the esophagus just above the gastroesophageal junction. The proximal esophagus is distended and fluid-filled. Findings are highly concerning for esophageal malignancy. 2. No evident lymphadenopathy or evidence of metastatic disease in the chest. 3. Emphysema  (ICD10-J43.9). 4. Coronary artery disease.  Aortic Atherosclerosis (ICD10-I70.0). Electronically Signed   By: Lauralyn Primes M.D.   On: 04/30/2020 13:27     Management plans discussed with the patient, family and they are in agreement.  CODE STATUS:     Code Status Orders  (From admission, onward)         Start     Ordered   04/30/20 1504  Full code  Continuous        04/30/20 1504        Code Status History    This patient has a current code status but no historical code status.   Advance Care Planning Activity    Advance Directive Documentation     Most Recent  Value  Type of Advance Directive Healthcare Power of Attorney, Living will  Pre-existing out of facility DNR order (yellow form or pink MOST form) --  "MOST" Form in Place? --      TOTAL TIME TAKING CARE OF THIS PATIENT: 35 minutes.    Alford Highlandichard Bianney Rockwood M.D on 05/01/2020 at 2:25 PM  Between 7am to 6pm - Pager - [phone]  After 6pm go to [email or web address] - password [email or web address]  [facility]  CC: Primary care physician; Danella PentonMiller, Mark F, MD

## 2020-05-01 NOTE — Transfer of Care (Signed)
Immediate Anesthesia Transfer of Care Note  Patient: Devon Elliott.  Procedure(s) Performed: ESOPHAGOGASTRODUODENOSCOPY (EGD) (N/A )  Patient Location: Endoscopy Unit  Anesthesia Type:General  Level of Consciousness: drowsy, patient cooperative and responds to stimulation  Airway & Oxygen Therapy: Patient Spontanous Breathing and Patient connected to face mask oxygen  Post-op Assessment: Report given to RN and Post -op Vital signs reviewed and stable  Post vital signs: Reviewed and stable  Last Vitals:  Vitals Value Taken Time  BP    Temp    Pulse    Resp    SpO2      Last Pain:  Vitals:   05/01/20 0838  TempSrc: Oral  PainSc:          Complications: No complications documented.

## 2020-05-01 NOTE — Anesthesia Preprocedure Evaluation (Signed)
Anesthesia Evaluation  Patient identified by MRN, date of birth, ID band Patient awake    Reviewed: Allergy & Precautions, H&P , NPO status , Patient's Chart, lab work & pertinent test results  History of Anesthesia Complications Negative for: history of anesthetic complications  Airway Mallampati: III  TM Distance: <3 FB Neck ROM: limited    Dental  (+) Chipped, Poor Dentition, Missing   Pulmonary neg pulmonary ROS, neg shortness of breath, former smoker,    Pulmonary exam normal        Cardiovascular Exercise Tolerance: Good hypertension, (-) angina(-) Past MI and (-) DOE Normal cardiovascular exam     Neuro/Psych negative neurological ROS  negative psych ROS   GI/Hepatic negative GI ROS, Neg liver ROS, GERD  Medicated and Controlled,  Endo/Other  negative endocrine ROS  Renal/GU Renal disease  negative genitourinary   Musculoskeletal   Abdominal   Peds  Hematology negative hematology ROS (+)   Anesthesia Other Findings Patient reports that he does that fell that any food or pills are stuck in his throat at this time.  Patient is NPO appropriate and reports no nausea or vomiting today.  Past Medical History: 07/21/2014: Complete tear of left rotator cuff 04/19/2014: Essential hypertension 07/21/2014: Impingement syndrome of left shoulder 02/08/2016: Left hip pain 07/21/2014: Left supraspinatus tenosynovitis 04/19/2014: Pure hypercholesterolemia  Past Surgical History: 1995: ELBOW SURGERY 05/31/2016: ESOPHAGOGASTRODUODENOSCOPY (EGD) WITH PROPOFOL; N/A     Comment:  Procedure: ESOPHAGOGASTRODUODENOSCOPY (EGD) WITH               PROPOFOL;  Surgeon: Wyline Mood, MD;  Location: [facility]               ENDOSCOPY;  Service: Endoscopy;  Laterality: N/A; 07/30/2019: KYPHOPLASTY; N/A     Comment:  Procedure: L1 KYPHOPLASTY;  Surgeon: Kennedy Bucker, MD;               Location: [facility] ORS;  Service: Orthopedics;  Laterality:                N/A;  BMI    Body Mass Index: 20.45 kg/m      Reproductive/Obstetrics negative OB ROS                             Anesthesia Physical Anesthesia Plan  ASA: III  Anesthesia Plan: General   Post-op Pain Management:    Induction: Intravenous  PONV Risk Score and Plan: Propofol infusion and TIVA  Airway Management Planned: Natural Airway and Nasal Cannula  Additional Equipment:   Intra-op Plan:   Post-operative Plan:   Informed Consent: I have reviewed the patients History and Physical, chart, labs and discussed the procedure including the risks, benefits and alternatives for the proposed anesthesia with the patient or authorized representative who has indicated his/her understanding and acceptance.     Dental Advisory Given  Plan Discussed with: Anesthesiologist, CRNA and Surgeon  Anesthesia Plan Comments: (Patient consented for risks of anesthesia including but not limited to:  - adverse reactions to medications - risk of intubation if required - damage to eyes, teeth, lips or other oral mucosa - nerve damage due to positioning  - sore throat or hoarseness - Damage to heart, brain, nerves, lungs, other parts of body or loss of life  Patient voiced understanding.)        Anesthesia Quick Evaluation

## 2020-05-02 ENCOUNTER — Encounter: Payer: Self-pay | Admitting: Internal Medicine

## 2020-05-04 LAB — SURGICAL PATHOLOGY

## 2021-04-08 IMAGING — CT CT NECK W/ CM
3 of 5 series · 12 of 35 positions shown, 14 images · IV contrast (omnipaque)
Comparison: Concurrent CT chest.  09/08/2002 CT neck.

CLINICAL DATA: Cranial neuropathy, patient states cannot eat or
drink for 2 days.

EXAM:
CT NECK WITH CONTRAST
TECHNIQUE: Multidetector CT imaging of the neck was performed using the
standard protocol following the bolus administration of intravenous
contrast.
CONTRAST:  60mL OMNIPAQUE IOHEXOL 300 MG/ML  SOLN

[Series 5: sag neck · sagittal · 0.39mm/px · 5 of 120 slices shown, 6 images]
[im 40/120  bone]
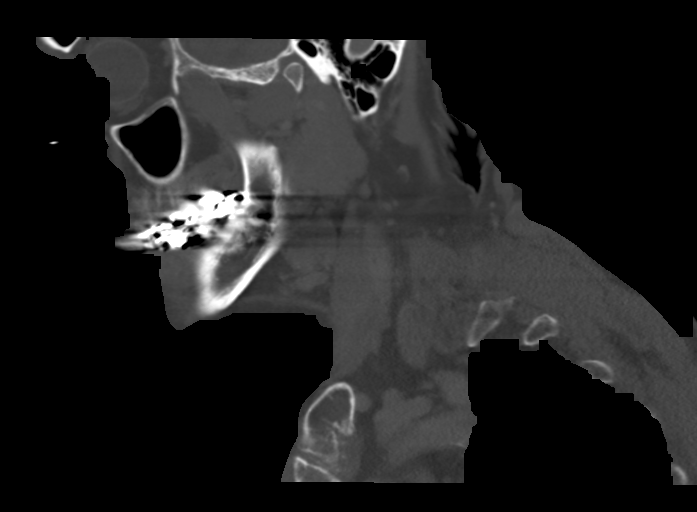
[im 50/120  bone]
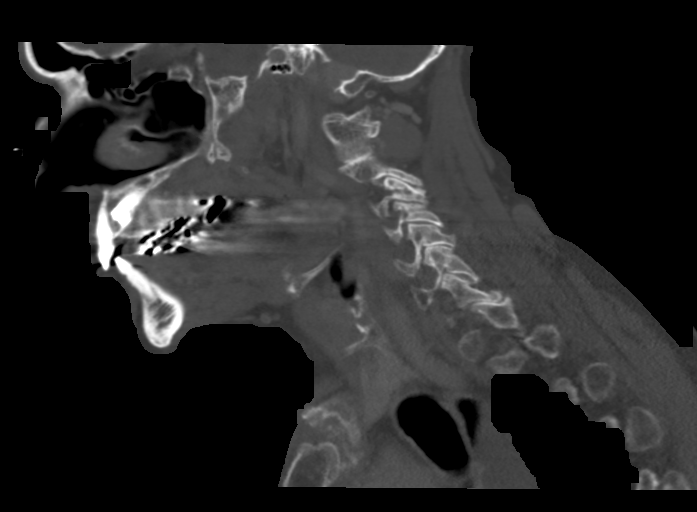
[im 60/120  soft-tissue]
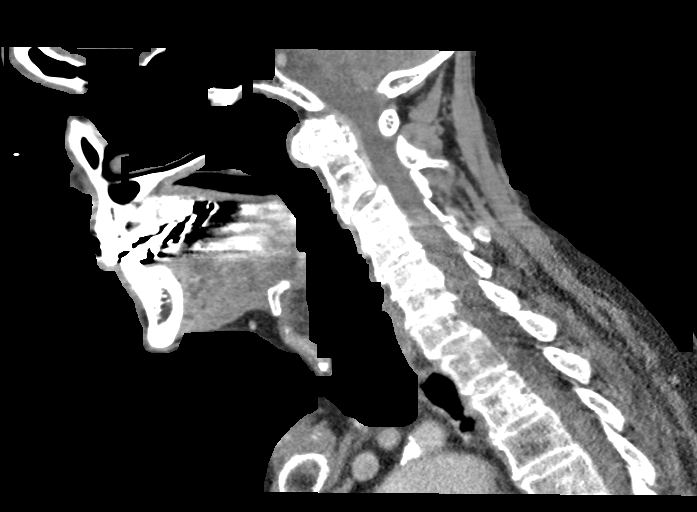
[im 60/120  bone]
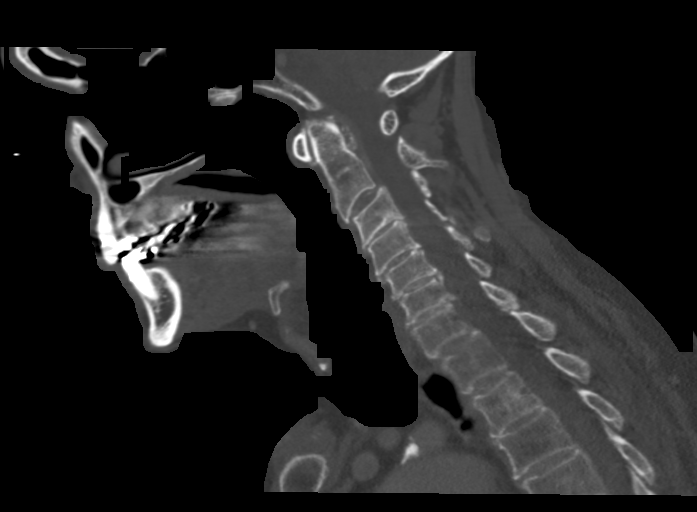
[im 70/120  bone]
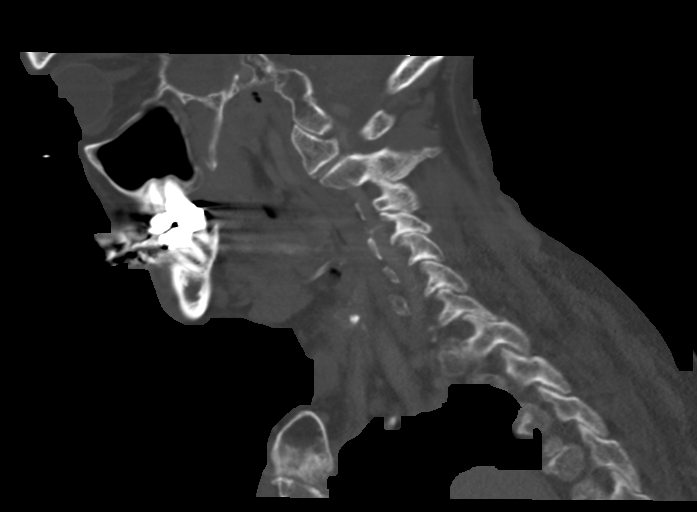
[im 80/120  bone]
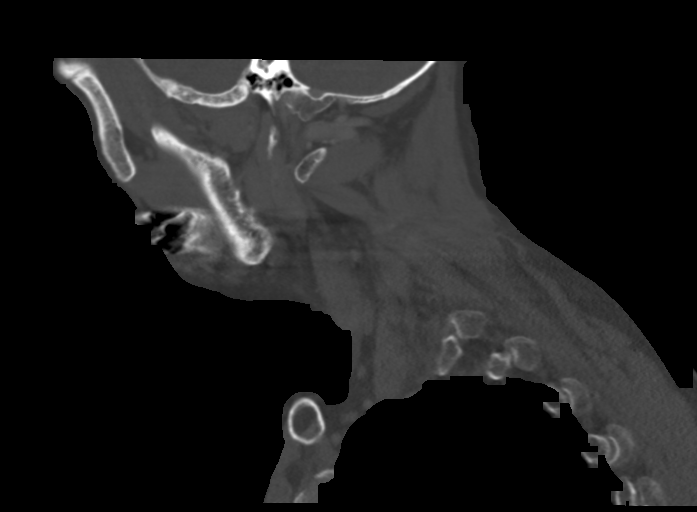

[Series 9: cor neck · coronal · 0.40mm/px · 3 of 85 slices shown]
[im 29/85  bone]
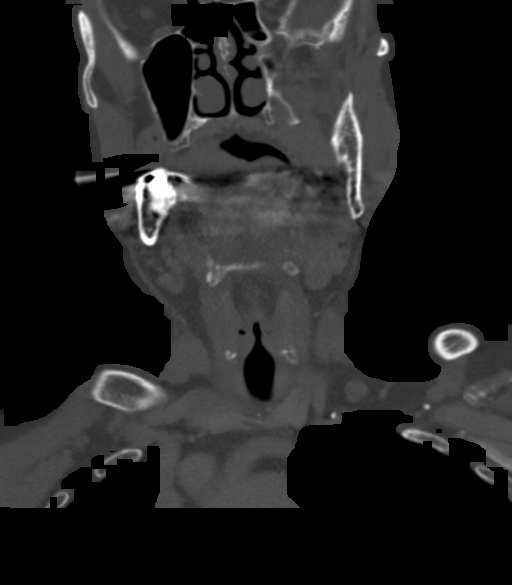
[im 38/85  bone]
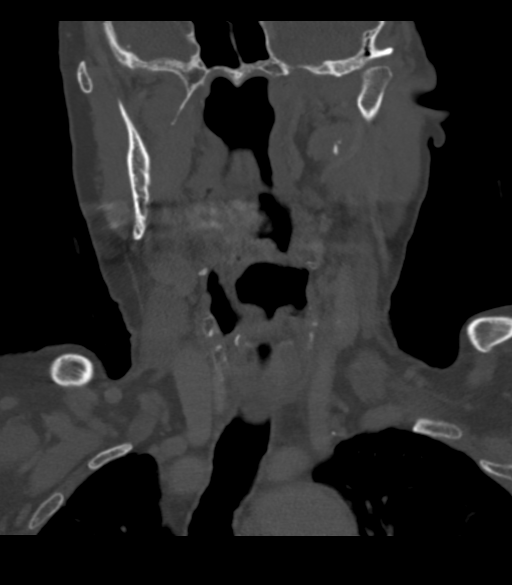
[im 47/85  bone]
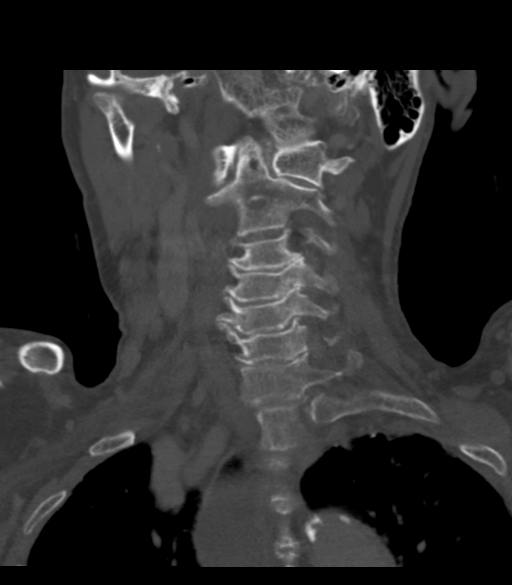

[Series 10: orthogonal ax · axial · 0.33mm/px · z∈[-343,-230]mm · 4 of 119 slices shown, 5 images]
[im 24/119  soft-tissue]
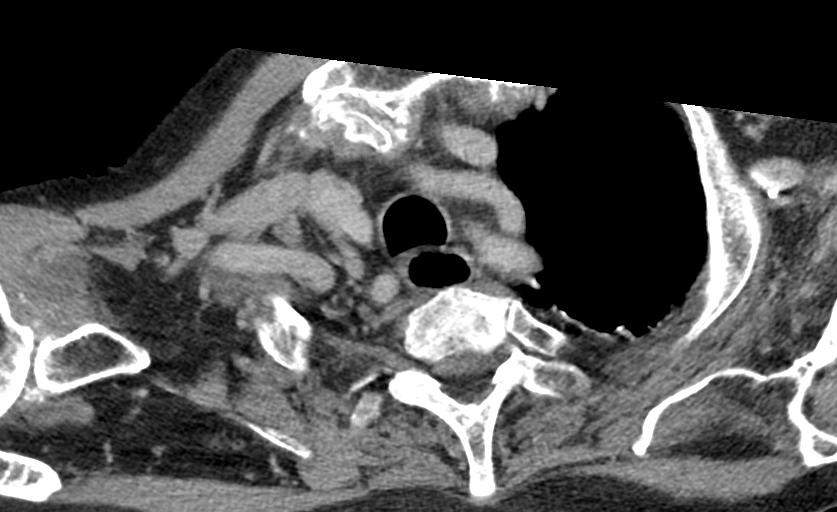
[im 24/119  bone]
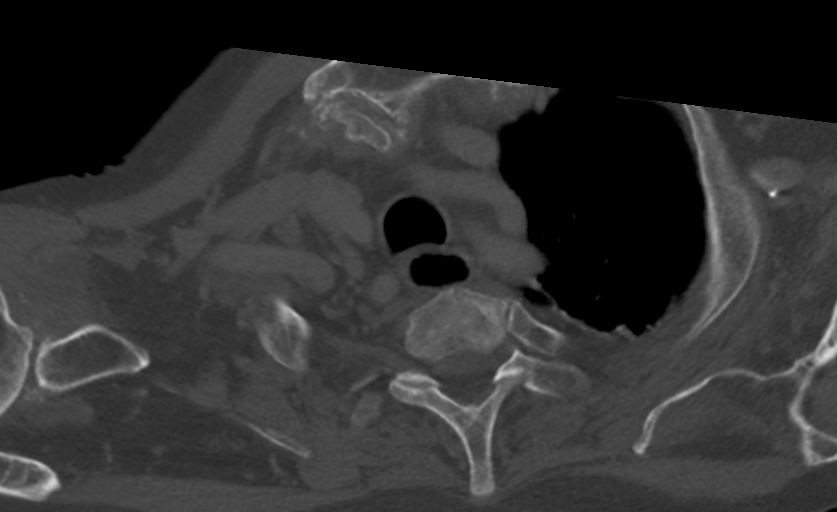
[im 48/119  bone]
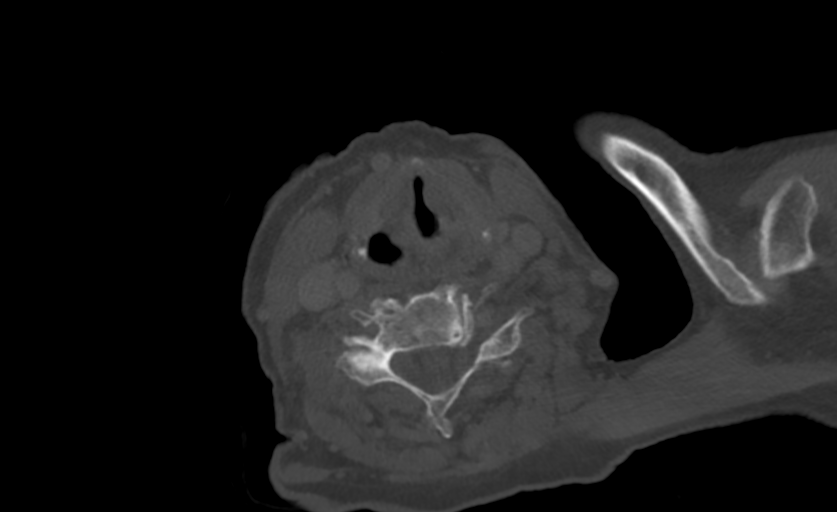
[im 71/119  bone]
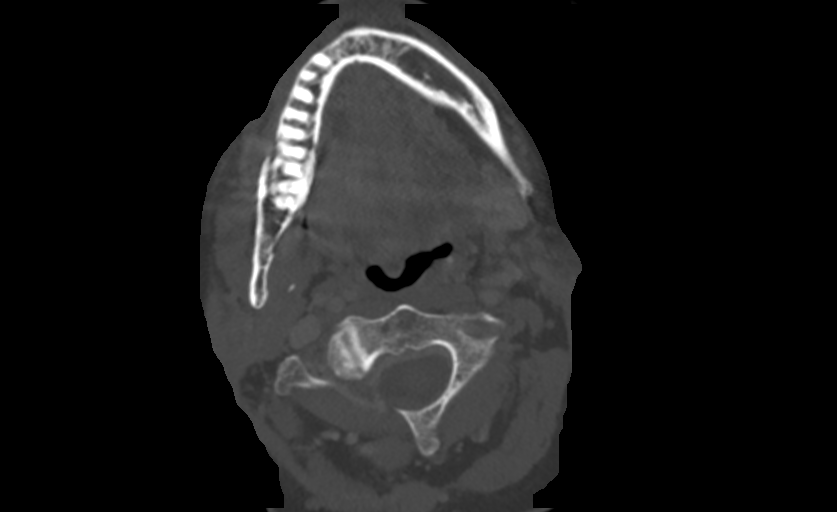
[im 95/119  bone]
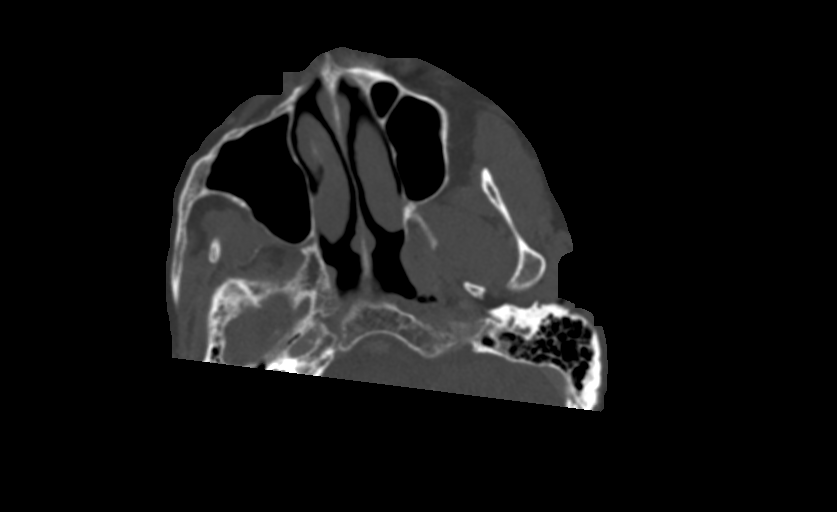

[12 of 35 positions shown; findings below may reference images not displayed]

FINDINGS: Pharynx and larynx: Normal nasopharynx. No mass, focal swelling or
abnormal enhancement. No significant airway narrowing. Dental
amalgam beam hardening artifact limits evaluation of the oral
cavity. Prominence of the right laryngeal ventricle. Symmetric
appearance of the vocal folds.

Salivary glands: No inflammation, mass, or stone.

Thyroid: Normal.

Lymph nodes: None enlarged or abnormal density.

Vascular: Normal intravascular enhancement. Aortic arch and carotid
bifurcation atherosclerotic calcifications. Approximately 50%
luminal narrowing of the proximal left ICA.

Limited intracranial: Grossly unremarkable.

Visualized orbits: No gross abnormalities.

Mastoids and visualized paranasal sinuses: Clear paranasal sinuses.
No mastoid effusion.

Skeleton: Multilevel spondylosis. Grade 1 C2-3 anterolisthesis. No
acute osseous abnormality.

Upper chest: Please see concurrent CT chest for findings below the
thoracic inlet.

Other: None.
IMPRESSION: No acute finding on CT neck.

Left carotid bifurcation atheromatous disease with 50% proximal ICA
luminal narrowing.

Please see concurrent CT chest for findings below thoracic inlet.

## 2021-06-19 ENCOUNTER — Emergency Department (HOSPITAL_COMMUNITY): Payer: Medicare Other

## 2021-06-19 ENCOUNTER — Emergency Department: Payer: Medicare Other | Attending: Emergency Medicine

## 2021-06-19 ENCOUNTER — Emergency Department (EMERGENCY_DEPARTMENT_HOSPITAL)
Admission: EM | Admit: 2021-06-19 | Discharge: 2021-06-19 | Disposition: A | Discharge status: Other Acute Inpatient Hospital | Payer: Medicare Other | Source: Home / Self Care | Attending: Emergency Medicine | Admitting: Emergency Medicine

## 2021-06-19 ENCOUNTER — Encounter (HOSPITAL_COMMUNITY)
Admission: RE | Disposition: A | Discharge status: Rehabilitation Facility | Payer: Self-pay | Attending: Family Medicine

## 2021-06-19 ENCOUNTER — Inpatient Hospital Stay (HOSPITAL_COMMUNITY): Payer: Medicare Other | Admitting: Certified Registered"

## 2021-06-19 ENCOUNTER — Inpatient Hospital Stay (HOSPITAL_COMMUNITY)
Admission: RE | Admit: 2021-06-19 | Discharge: 2021-06-29 | DRG: 023 | Disposition: A | Discharge status: Rehabilitation Facility | Payer: Medicare Other | Source: Other Acute Inpatient Hospital | Attending: Internal Medicine | Admitting: Internal Medicine

## 2021-06-19 ENCOUNTER — Encounter (HOSPITAL_COMMUNITY): Payer: Self-pay | Admitting: Certified Registered"

## 2021-06-19 ENCOUNTER — Encounter: Payer: Self-pay | Admitting: Radiology

## 2021-06-19 ENCOUNTER — Other Ambulatory Visit: Payer: Self-pay | Admitting: Radiology

## 2021-06-19 ENCOUNTER — Emergency Department: Payer: Medicare Other

## 2021-06-19 DIAGNOSIS — I10 Essential (primary) hypertension: Secondary | ICD-10-CM | POA: Insufficient documentation

## 2021-06-19 DIAGNOSIS — R4182 Altered mental status, unspecified: Secondary | ICD-10-CM | POA: Insufficient documentation

## 2021-06-19 DIAGNOSIS — E876 Hypokalemia: Secondary | ICD-10-CM | POA: Diagnosis not present

## 2021-06-19 DIAGNOSIS — K219 Gastro-esophageal reflux disease without esophagitis: Secondary | ICD-10-CM | POA: Diagnosis present

## 2021-06-19 DIAGNOSIS — R29714 NIHSS score 14: Secondary | ICD-10-CM | POA: Diagnosis present

## 2021-06-19 DIAGNOSIS — Z79899 Other long term (current) drug therapy: Secondary | ICD-10-CM | POA: Diagnosis not present

## 2021-06-19 DIAGNOSIS — Y900 Blood alcohol level of less than 20 mg/100 ml: Secondary | ICD-10-CM | POA: Insufficient documentation

## 2021-06-19 DIAGNOSIS — E785 Hyperlipidemia, unspecified: Secondary | ICD-10-CM | POA: Diagnosis present

## 2021-06-19 DIAGNOSIS — R4701 Aphasia: Secondary | ICD-10-CM | POA: Diagnosis present

## 2021-06-19 DIAGNOSIS — N179 Acute kidney failure, unspecified: Secondary | ICD-10-CM | POA: Diagnosis present

## 2021-06-19 DIAGNOSIS — I63412 Cerebral infarction due to embolism of left middle cerebral artery: Principal | ICD-10-CM | POA: Diagnosis present

## 2021-06-19 DIAGNOSIS — K2970 Gastritis, unspecified, without bleeding: Secondary | ICD-10-CM | POA: Diagnosis present

## 2021-06-19 DIAGNOSIS — G9341 Metabolic encephalopathy: Secondary | ICD-10-CM | POA: Diagnosis not present

## 2021-06-19 DIAGNOSIS — R410 Disorientation, unspecified: Secondary | ICD-10-CM | POA: Diagnosis present

## 2021-06-19 DIAGNOSIS — J9601 Acute respiratory failure with hypoxia: Secondary | ICD-10-CM | POA: Diagnosis present

## 2021-06-19 DIAGNOSIS — I639 Cerebral infarction, unspecified: Secondary | ICD-10-CM | POA: Insufficient documentation

## 2021-06-19 DIAGNOSIS — Z20822 Contact with and (suspected) exposure to covid-19: Secondary | ICD-10-CM | POA: Insufficient documentation

## 2021-06-19 DIAGNOSIS — Z881 Allergy status to other antibiotic agents status: Secondary | ICD-10-CM | POA: Diagnosis not present

## 2021-06-19 DIAGNOSIS — I480 Paroxysmal atrial fibrillation: Secondary | ICD-10-CM | POA: Diagnosis not present

## 2021-06-19 DIAGNOSIS — Z7189 Other specified counseling: Secondary | ICD-10-CM

## 2021-06-19 DIAGNOSIS — F05 Delirium due to known physiological condition: Secondary | ICD-10-CM | POA: Diagnosis not present

## 2021-06-19 DIAGNOSIS — Z87891 Personal history of nicotine dependence: Secondary | ICD-10-CM | POA: Insufficient documentation

## 2021-06-19 DIAGNOSIS — Z888 Allergy status to other drugs, medicaments and biological substances status: Secondary | ICD-10-CM | POA: Diagnosis not present

## 2021-06-19 DIAGNOSIS — R1013 Epigastric pain: Secondary | ICD-10-CM | POA: Diagnosis present

## 2021-06-19 DIAGNOSIS — R29810 Facial weakness: Secondary | ICD-10-CM | POA: Diagnosis present

## 2021-06-19 DIAGNOSIS — I110 Hypertensive heart disease with heart failure: Secondary | ICD-10-CM | POA: Diagnosis present

## 2021-06-19 DIAGNOSIS — Z781 Physical restraint status: Secondary | ICD-10-CM

## 2021-06-19 DIAGNOSIS — R531 Weakness: Secondary | ICD-10-CM | POA: Diagnosis not present

## 2021-06-19 DIAGNOSIS — Z7901 Long term (current) use of anticoagulants: Secondary | ICD-10-CM

## 2021-06-19 DIAGNOSIS — I63512 Cerebral infarction due to unspecified occlusion or stenosis of left middle cerebral artery: Secondary | ICD-10-CM | POA: Diagnosis not present

## 2021-06-19 DIAGNOSIS — E7800 Pure hypercholesterolemia, unspecified: Secondary | ICD-10-CM | POA: Diagnosis not present

## 2021-06-19 DIAGNOSIS — I749 Embolism and thrombosis of unspecified artery: Secondary | ICD-10-CM | POA: Diagnosis not present

## 2021-06-19 DIAGNOSIS — E860 Dehydration: Secondary | ICD-10-CM | POA: Diagnosis present

## 2021-06-19 DIAGNOSIS — Z515 Encounter for palliative care: Secondary | ICD-10-CM | POA: Diagnosis not present

## 2021-06-19 DIAGNOSIS — Z66 Do not resuscitate: Secondary | ICD-10-CM | POA: Diagnosis present

## 2021-06-19 DIAGNOSIS — R339 Retention of urine, unspecified: Secondary | ICD-10-CM | POA: Diagnosis not present

## 2021-06-19 DIAGNOSIS — F03A0 Unspecified dementia, mild, without behavioral disturbance, psychotic disturbance, mood disturbance, and anxiety: Secondary | ICD-10-CM | POA: Diagnosis present

## 2021-06-19 DIAGNOSIS — J9811 Atelectasis: Secondary | ICD-10-CM | POA: Diagnosis present

## 2021-06-19 DIAGNOSIS — I5020 Unspecified systolic (congestive) heart failure: Secondary | ICD-10-CM | POA: Diagnosis not present

## 2021-06-19 DIAGNOSIS — I69320 Aphasia following cerebral infarction: Secondary | ICD-10-CM | POA: Diagnosis not present

## 2021-06-19 DIAGNOSIS — R7989 Other specified abnormal findings of blood chemistry: Secondary | ICD-10-CM | POA: Diagnosis not present

## 2021-06-19 DIAGNOSIS — Z8673 Personal history of transient ischemic attack (TIA), and cerebral infarction without residual deficits: Secondary | ICD-10-CM | POA: Diagnosis not present

## 2021-06-19 DIAGNOSIS — I482 Chronic atrial fibrillation, unspecified: Secondary | ICD-10-CM | POA: Diagnosis not present

## 2021-06-19 HISTORY — PX: IR US GUIDE VASC ACCESS RIGHT: IMG2390

## 2021-06-19 HISTORY — PX: IR PERCUTANEOUS ART THROMBECTOMY/INFUSION INTRACRANIAL INC DIAG ANGIO: IMG6087

## 2021-06-19 HISTORY — PX: RADIOLOGY WITH ANESTHESIA: SHX6223

## 2021-06-19 HISTORY — PX: IR CT HEAD LTD: IMG2386

## 2021-06-19 LAB — URINALYSIS, ROUTINE W REFLEX MICROSCOPIC
Bilirubin Urine: NEGATIVE
Glucose, UA: NEGATIVE mg/dL
Hgb urine dipstick: NEGATIVE
Ketones, ur: NEGATIVE mg/dL
Leukocytes,Ua: NEGATIVE
Nitrite: NEGATIVE
Protein, ur: 30 mg/dL — AB
Specific Gravity, Urine: 1.023 (ref 1.005–1.030)
Squamous Epithelial / HPF: NONE SEEN (ref 0–5)
pH: 6 (ref 5.0–8.0)

## 2021-06-19 LAB — URINE DRUG SCREEN, QUALITATIVE (ARMC ONLY)
Amphetamines, Ur Screen: NOT DETECTED
Barbiturates, Ur Screen: NOT DETECTED
Benzodiazepine, Ur Scrn: POSITIVE — AB
Cannabinoid 50 Ng, Ur ~~LOC~~: NOT DETECTED
Cocaine Metabolite,Ur ~~LOC~~: NOT DETECTED
MDMA (Ecstasy)Ur Screen: NOT DETECTED
Methadone Scn, Ur: NOT DETECTED
Opiate, Ur Screen: NOT DETECTED
Phencyclidine (PCP) Ur S: NOT DETECTED
Tricyclic, Ur Screen: NOT DETECTED

## 2021-06-19 LAB — CBC
HCT: 39.1 % (ref 39.0–52.0)
HCT: 37 % — ABNORMAL LOW (ref 39.0–52.0)
Hemoglobin: 12.7 g/dL — ABNORMAL LOW (ref 13.0–17.0)
Hemoglobin: 12.2 g/dL — ABNORMAL LOW (ref 13.0–17.0)
MCH: 30.8 pg (ref 26.0–34.0)
MCH: 30.7 pg (ref 26.0–34.0)
MCHC: 32.5 g/dL (ref 30.0–36.0)
MCHC: 33 g/dL (ref 30.0–36.0)
MCV: 94.7 fL (ref 80.0–100.0)
MCV: 93 fL (ref 80.0–100.0)
Platelets: 172 10*3/uL (ref 150–400)
Platelets: 167 10*3/uL (ref 150–400)
RBC: 4.13 MIL/uL — ABNORMAL LOW (ref 4.22–5.81)
RBC: 3.98 MIL/uL — ABNORMAL LOW (ref 4.22–5.81)
RDW: 15.1 % (ref 11.5–15.5)
RDW: 15 % (ref 11.5–15.5)
WBC: 6.7 10*3/uL (ref 4.0–10.5)
WBC: 9.3 10*3/uL (ref 4.0–10.5)
nRBC: 0 % (ref 0.0–0.2)
nRBC: 0 % (ref 0.0–0.2)

## 2021-06-19 LAB — DIFFERENTIAL
Abs Immature Granulocytes: 0.02 10*3/uL (ref 0.00–0.07)
Basophils Absolute: 0.1 10*3/uL (ref 0.0–0.1)
Basophils Relative: 1 %
Eosinophils Absolute: 0.1 10*3/uL (ref 0.0–0.5)
Eosinophils Relative: 2 %
Immature Granulocytes: 0 %
Lymphocytes Relative: 13 %
Lymphs Abs: 0.9 10*3/uL (ref 0.7–4.0)
Monocytes Absolute: 0.4 10*3/uL (ref 0.1–1.0)
Monocytes Relative: 7 %
Neutro Abs: 5.1 10*3/uL (ref 1.7–7.7)
Neutrophils Relative %: 77 %

## 2021-06-19 LAB — COMPREHENSIVE METABOLIC PANEL
ALT: 10 U/L (ref 0–44)
AST: 21 U/L (ref 15–41)
Albumin: 3.8 g/dL (ref 3.5–5.0)
Alkaline Phosphatase: 56 U/L (ref 38–126)
Anion gap: 3 — ABNORMAL LOW (ref 5–15)
BUN: 19 mg/dL (ref 8–23)
CO2: 28 mmol/L (ref 22–32)
Calcium: 8.6 mg/dL — ABNORMAL LOW (ref 8.9–10.3)
Chloride: 109 mmol/L (ref 98–111)
Creatinine, Ser: 1.12 mg/dL (ref 0.61–1.24)
GFR, Estimated: >60 mL/min (ref >60)
Glucose, Bld: 113 mg/dL — ABNORMAL HIGH (ref 70–99)
Potassium: 4.3 mmol/L (ref 3.5–5.1)
Sodium: 140 mmol/L (ref 135–145)
Total Bilirubin: 0.6 mg/dL (ref 0.3–1.2)
Total Protein: 6.3 g/dL — ABNORMAL LOW (ref 6.5–8.1)

## 2021-06-19 LAB — PROTIME-INR
INR: 1 (ref 0.8–1.2)
Prothrombin Time: 13.3 seconds (ref 11.4–15.2)

## 2021-06-19 LAB — CREATININE, SERUM
Creatinine, Ser: 1.22 mg/dL (ref 0.61–1.24)
GFR, Estimated: 56 mL/min — ABNORMAL LOW (ref >60)

## 2021-06-19 LAB — RESP PANEL BY RT-PCR (FLU A&B, COVID) ARPGX2
Influenza A by PCR: NEGATIVE
Influenza B by PCR: NEGATIVE
SARS Coronavirus 2 by RT PCR: NEGATIVE

## 2021-06-19 LAB — ETHANOL: Alcohol, Ethyl (B): <10 mg/dL (ref <10)

## 2021-06-19 LAB — APTT: aPTT: 30 seconds (ref 24–36)

## 2021-06-19 SURGERY — IR WITH ANESTHESIA
Anesthesia: General

## 2021-06-19 MED ORDER — ACETAMINOPHEN 160 MG/5ML PO SOLN: 650.0000 mg | ORAL | Status: DC | PRN

## 2021-06-19 MED ORDER — IOHEXOL 350 MG/ML SOLN
100.0000 mL | Freq: Once | INTRAVENOUS | Status: AC | PRN
  Administered 2021-06-19: 100 mL via INTRAVENOUS
  Filled 2021-06-19: qty 100

## 2021-06-19 MED ORDER — SUGAMMADEX SODIUM 200 MG/2ML IV SOLN
INTRAVENOUS | Status: DC | PRN
  Administered 2021-06-19: 200 mg via INTRAVENOUS

## 2021-06-19 MED ORDER — STROKE: EARLY STAGES OF RECOVERY BOOK
Freq: Once | Status: AC
  Filled 2021-06-19: qty 1

## 2021-06-19 MED ORDER — HALOPERIDOL LACTATE 5 MG/ML IJ SOLN
INTRAMUSCULAR | Status: AC
  Administered 2021-06-19: 2 mg via INTRAVENOUS
  Filled 2021-06-19: qty 1

## 2021-06-19 MED ORDER — ACETAMINOPHEN 650 MG RE SUPP: 650.0000 mg | RECTAL | Status: DC | PRN

## 2021-06-19 MED ORDER — FLUMAZENIL 0.5 MG/5ML IV SOLN
0.2000 mg | Freq: Once | INTRAVENOUS | Status: AC
  Administered 2021-06-19: 0.2 mg via INTRAVENOUS

## 2021-06-19 MED ORDER — IOHEXOL 300 MG/ML  SOLN: 100.0000 mL | Freq: Once | INTRAMUSCULAR | Status: DC | PRN

## 2021-06-19 MED ORDER — LABETALOL HCL 5 MG/ML IV SOLN
INTRAVENOUS | Status: DC | PRN
  Administered 2021-06-19 (×2): 5 mg via INTRAVENOUS
  Administered 2021-06-19: 10 mg via INTRAVENOUS

## 2021-06-19 MED ORDER — ENOXAPARIN SODIUM 40 MG/0.4ML IJ SOSY
40.0000 mg | PREFILLED_SYRINGE | INTRAMUSCULAR | Status: DC
  Administered 2021-06-19 – 2021-06-20 (×2): 40 mg via SUBCUTANEOUS
  Filled 2021-06-19 (×2): qty 0.4

## 2021-06-19 MED ORDER — LIDOCAINE 2% (20 MG/ML) 5 ML SYRINGE
INTRAMUSCULAR | Status: DC | PRN
  Administered 2021-06-19: 60 mg via INTRAVENOUS

## 2021-06-19 MED ORDER — SENNOSIDES-DOCUSATE SODIUM 8.6-50 MG PO TABS: 1.0000 | ORAL_TABLET | Freq: Every evening | ORAL | Status: DC | PRN

## 2021-06-19 MED ORDER — ONDANSETRON HCL 4 MG/2ML IJ SOLN
INTRAMUSCULAR | Status: DC | PRN
  Administered 2021-06-19: 4 mg via INTRAVENOUS

## 2021-06-19 MED ORDER — PROPOFOL 10 MG/ML IV BOLUS
INTRAVENOUS | Status: DC | PRN
  Administered 2021-06-19: 140 mg via INTRAVENOUS

## 2021-06-19 MED ORDER — ENOXAPARIN SODIUM 40 MG/0.4ML IJ SOSY: 40.0000 mg | PREFILLED_SYRINGE | INTRAMUSCULAR | Status: DC

## 2021-06-19 MED ORDER — CLEVIDIPINE BUTYRATE 0.5 MG/ML IV EMUL
0.0000 mg/h | INTRAVENOUS | Status: DC
  Administered 2021-06-19: 5 mg/h via INTRAVENOUS
  Administered 2021-06-19: 4 mg/h via INTRAVENOUS
  Administered 2021-06-20: 6 mg/h via INTRAVENOUS
  Administered 2021-06-20: 16 mg/h via INTRAVENOUS
  Administered 2021-06-20: 6 mg/h via INTRAVENOUS
  Administered 2021-06-21: 2 mg/h via INTRAVENOUS
  Administered 2021-06-21: 4 mg/h via INTRAVENOUS
  Filled 2021-06-19: qty 100
  Filled 2021-06-19 (×9): qty 50

## 2021-06-19 MED ORDER — DIPHENHYDRAMINE HCL 50 MG/ML IJ SOLN
25.0000 mg | Freq: Once | INTRAMUSCULAR | Status: AC
  Administered 2021-06-19: 25 mg via INTRAVENOUS

## 2021-06-19 MED ORDER — LORAZEPAM 2 MG/ML IJ SOLN
3.0000 mg | Freq: Once | INTRAMUSCULAR | Status: AC
  Administered 2021-06-19: 3 mg via INTRAVENOUS
  Filled 2021-06-19: qty 2

## 2021-06-19 MED ORDER — ACETAMINOPHEN 325 MG PO TABS
650.0000 mg | ORAL_TABLET | ORAL | Status: DC | PRN
  Administered 2021-06-24: 650 mg via ORAL
  Filled 2021-06-19: qty 2

## 2021-06-19 MED ORDER — PANTOPRAZOLE SODIUM 40 MG PO TBEC: 40.0000 mg | DELAYED_RELEASE_TABLET | Freq: Every day | ORAL | Status: DC

## 2021-06-19 MED ORDER — ACETAMINOPHEN 325 MG PO TABS: 650.0000 mg | ORAL_TABLET | ORAL | Status: DC | PRN

## 2021-06-19 MED ORDER — PHENYLEPHRINE HCL-NACL 20-0.9 MG/250ML-% IV SOLN
INTRAVENOUS | Status: DC | PRN
  Administered 2021-06-19: 30 ug/min via INTRAVENOUS

## 2021-06-19 MED ORDER — ACETAMINOPHEN 160 MG/5ML PO SOLN
650.0000 mg | ORAL | Status: DC | PRN
  Filled 2021-06-19: qty 20.3

## 2021-06-19 MED ORDER — HALOPERIDOL LACTATE 5 MG/ML IJ SOLN
2.0000 mg | Freq: Once | INTRAMUSCULAR | Status: AC
Start: 2021-06-19 — End: 2021-06-19

## 2021-06-19 MED ORDER — SODIUM CHLORIDE 0.9 % IV SOLN: INTRAVENOUS | Status: AC

## 2021-06-19 MED ORDER — FLUMAZENIL 0.5 MG/5ML IV SOLN
INTRAVENOUS | Status: AC
  Filled 2021-06-19: qty 5

## 2021-06-19 MED ORDER — PHENYLEPHRINE 40 MCG/ML (10ML) SYRINGE FOR IV PUSH (FOR BLOOD PRESSURE SUPPORT)
PREFILLED_SYRINGE | INTRAVENOUS | Status: DC | PRN
  Administered 2021-06-19: 80 ug via INTRAVENOUS
  Administered 2021-06-19: 120 ug via INTRAVENOUS
  Administered 2021-06-19: 80 ug via INTRAVENOUS
  Administered 2021-06-19: 120 ug via INTRAVENOUS

## 2021-06-19 MED ORDER — DIPHENHYDRAMINE HCL 50 MG/ML IJ SOLN
INTRAMUSCULAR | Status: AC
  Filled 2021-06-19: qty 1

## 2021-06-19 MED ORDER — CLEVIDIPINE BUTYRATE 0.5 MG/ML IV EMUL
INTRAVENOUS | Status: AC
  Filled 2021-06-19: qty 50

## 2021-06-19 MED ORDER — ACETAMINOPHEN 325 MG RE SUPP: 650.0000 mg | RECTAL | Status: DC | PRN

## 2021-06-19 MED ORDER — STROKE: EARLY STAGES OF RECOVERY BOOK: Freq: Once | Status: DC

## 2021-06-19 MED ORDER — SODIUM CHLORIDE 0.9 % IV SOLN: INTRAVENOUS | Status: DC

## 2021-06-19 MED ORDER — DEXAMETHASONE SODIUM PHOSPHATE 10 MG/ML IJ SOLN
INTRAMUSCULAR | Status: DC | PRN
  Administered 2021-06-19: 10 mg via INTRAVENOUS

## 2021-06-19 MED ORDER — ESMOLOL HCL 100 MG/10ML IV SOLN
INTRAVENOUS | Status: DC | PRN
  Administered 2021-06-19: 20 mg via INTRAVENOUS

## 2021-06-19 MED ORDER — ROCURONIUM BROMIDE 10 MG/ML (PF) SYRINGE
PREFILLED_SYRINGE | INTRAVENOUS | Status: DC | PRN
  Administered 2021-06-19: 40 mg via INTRAVENOUS

## 2021-06-19 MED ORDER — SUCCINYLCHOLINE CHLORIDE 200 MG/10ML IV SOSY
PREFILLED_SYRINGE | INTRAVENOUS | Status: DC | PRN
  Administered 2021-06-19: 130 mg via INTRAVENOUS

## 2021-06-19 MED ORDER — LACTATED RINGERS IV SOLN: INTRAVENOUS | Status: DC | PRN

## 2021-06-19 NOTE — Consult Note (Signed)
NEUROLOGY CONSULTATION NOTE   Date of service: June 19, 2021 Patient Name: Devon Elliott. MRN:  [id number] DOB:  11-Apr-1927 Reason for consult: stroke code Requesting physician: Dr. Blake Divine _ _ _   _ __   _ __ _ _  __ __   _ __   __ _  History of Present Illness   This is a 87 year old gentleman with a history of hypertension, hyperlipidemia, remote history of A. fib not on anticoagulation who was brought in by EMS as a stroke code for aphasia.  Wife reports that yesterday he did have some mild word finding difficulties in the afternoon and was not feeling well when he went to bed.  This morning he woke up and dressed himself like usual and sat down and ate breakfast and then sat down in his recliner and wife noticed severe acute worsening of word finding difficulties.  TNK not administered 2/2 presence of v mild sx yesterday. Acute worsening occurred today at 0900. EMS was called and patient was taken to ED.  On my examination he has a very dense global aphasia and is unable to follow any commands and has complete expressive aphasia as well.  He is antigravity in his right leg and has some movement against gravity in his RLE. L gaze preference but is able to cross midline to the R. He will not attend on the R. CTH NAICP w/ ASPECTS 10. CTA H&N showed distal L M2. CTP showed no core infarct w/ 90 cc perfusion deficit. He walks with a walker at baseline but per wife has no baseline speech or cognitive deficits. mRS = 3. I discussed the case with Dr. Earleen Newport at [facility] and we agreed that bc patient's new deficits would be so severely disabling it would be reasonable to offer intervention. I discussed the benefits and risks (including 10% risk of intracranial hemorrhage) of thrombectomy with his wife June at [phone] who did consent to procedure.    ROS   UTA 2/2 aphasia  Past History   I have reviewed the following:  Past Medical History:  Diagnosis Date   Complete  tear of left rotator cuff 07/21/2014   Essential hypertension 04/19/2014   Impingement syndrome of left shoulder 07/21/2014   Left hip pain 02/08/2016   Left supraspinatus tenosynovitis 07/21/2014   Pure hypercholesterolemia 04/19/2014   Past Surgical History:  Procedure Laterality Date   ELBOW SURGERY  1995   ESOPHAGOGASTRODUODENOSCOPY N/A 05/01/2020   Procedure: ESOPHAGOGASTRODUODENOSCOPY (EGD);  Surgeon: Toledo, Benay Pike, MD;  Location: [facility] ENDOSCOPY;  Service: Gastroenterology;  Laterality: N/A;   ESOPHAGOGASTRODUODENOSCOPY (EGD) WITH PROPOFOL N/A 05/31/2016   Procedure: ESOPHAGOGASTRODUODENOSCOPY (EGD) WITH PROPOFOL;  Surgeon: Jonathon Bellows, MD;  Location: [facility] ENDOSCOPY;  Service: Endoscopy;  Laterality: N/A;   KYPHOPLASTY N/A 07/30/2019   Procedure: L1 KYPHOPLASTY;  Surgeon: Hessie Knows, MD;  Location: [facility] ORS;  Service: Orthopedics;  Laterality: N/A;   Family History  Problem Relation Age of Onset   Pancreatic cancer Mother    Diabetes Father    Social History   Socioeconomic History   Marital status: Married    Spouse name: Not on file   Number of children: Not on file   Years of education: Not on file   Highest education level: Not on file  Occupational History   Not on file  Tobacco Use   Smoking status: Former   Smokeless tobacco: Never  Substance and Sexual Activity   Alcohol use: Yes  Comment: social   Drug use: No   Sexual activity: Not on file  Other Topics Concern   Not on file  Social History Narrative   Not on file   Social Determinants of Health   Financial Resource Strain: Not on file  Food Insecurity: Not on file  Transportation Needs: Not on file  Physical Activity: Not on file  Stress: Not on file  Social Connections: Not on file   Allergies  Allergen Reactions   Ace Inhibitors     Other reaction(s): Kidney Disorder   Clarithromycin Hives   Etodolac     Other reaction(s): Kidney Disorder   Levofloxacin Swelling    Medications   (Not  in a hospital admission)    No current facility-administered medications for this encounter.  Current Outpatient Medications:    acetaminophen (TYLENOL) 500 MG tablet, Take 1,000 mg by mouth as needed., Disp: , Rfl:    metoCLOPramide (REGLAN) 5 MG tablet, Take 5 mg by mouth at bedtime., Disp: , Rfl:    metoprolol succinate (TOPROL-XL) 25 MG 24 hr tablet, Take 1 tablet (25 mg total) by mouth daily., Disp: 30 tablet, Rfl: 0   pantoprazole (PROTONIX) 40 MG tablet, Take 1 tablet (40 mg total) by mouth daily., Disp: 30 tablet, Rfl: 0   tiZANidine (ZANAFLEX) 2 MG tablet, Take 2 mg by mouth 2 (two) times daily., Disp: , Rfl:    traMADol (ULTRAM) 50 MG tablet, Take 50 mg by mouth 2 (two) times daily as needed., Disp: , Rfl:   Vitals   Vitals:   06/19/21 1154 06/19/21 1155  BP:  (!) 163/98  Pulse:  70  Resp:  18  Temp:  98.4 F (36.9 C)  TempSrc:  Axillary  SpO2:  100%  Weight: 67.2 kg      Body mass index is 21.88 kg/m.  Physical Exam   Physical Exam Gen: alert, unable to answer orientation questions 2/2 aphasia, intermittently trying to get out of bed.  HEENT: Atraumatic, normocephalic;mucous membranes moist; oropharynx clear, tongue without atrophy or fasciculations. Neck: Supple, trachea midline. Resp: CTAB, no w/r/r CV: RRR, no m/g/r; nml S1 and S2. 2+ symmetric peripheral pulses. Abd: soft/NT/ND; nabs x 4 quad Extrem: Nml bulk; no cyanosis, clubbing, or edema.  Neuro: *MS: alert, unable to answer orientation questions 2/2 aphasia, intermittently trying to get out of bed.  *Speech: mild dysarthria, severe global aphasia, not able to follow any commands, nonsensical speech only *CN:    I: Deferred   II,III: PERRLA, blinks to threat bilat, optic discs unable to be visualized 2/2 pupillary constriction   III,IV,VI: L gaze preference but is able to cross midline to the R, no ptosis   V: Sensation intact from V1 to V3 to LT   VII: Eyelid closure was full.  R UMN facial droop    VIII: Hearing intact to voice   IX,X: Voice normal, palate elevates symmetrically    XI: SCM/trap 5/5 bilat   XII: Tongue protrudes midline, no atrophy or fasciculations   *Motor:   Normal bulk.  No tremor, rigidity or bradykinesia. LUE and LLE full strength. At least anti-gravity in RUE with drift to bed, some movement against gravity RLE.  *Sensory: SILT, R-sided neglect to attention *Coordination:  UTA *Reflexes:  2+ and symmetric throughout without clonus; toes down-going bilat *Gait: UTA  NIHSS  1a Level of Conscious.: 0 1b LOC Questions: 2 1c LOC Commands: 2 2 Best Gaze: 1 3 Visual: 0 4 Facial Palsy: 1 5a Motor  Arm - left: 0 5b Motor Arm - Right: 2 6a Motor Leg - Left: 0 6b Motor Leg - Right: 2 7 Limb Ataxia: 0 8 Sensory: 0 9 Best Language: 3 10 Dysarthria: 1 11 Extinct. and Inatten.: 1  TOTAL: 15   Premorbid mRS = 3   Labs   CBC:  Recent Labs  Lab 06/19/21 1207  WBC 6.7  NEUTROABS 5.1  HGB 12.7*  HCT 39.1  MCV 94.7  PLT 172    Basic Metabolic Panel:  Lab Results  Component Value Date   NA 140 06/19/2021   K 4.3 06/19/2021   CO2 28 06/19/2021   GLUCOSE 113 (H) 06/19/2021   BUN 19 06/19/2021   CREATININE 1.12 06/19/2021   CALCIUM 8.6 (L) 06/19/2021   GFRNONAA >60 06/19/2021   GFRAA >60 07/13/2019   Lipid Panel: No results found for: LDLCALC HgbA1c: No results found for: HGBA1C Urine Drug Screen: No results found for: LABOPIA, COCAINSCRNUR, LABBENZ, AMPHETMU, THCU, LABBARB  Alcohol Level     Component Value Date/Time   [facility] <10 06/19/2021 1207     Impression   This is a 87 year old gentleman with a history of hypertension, hyperlipidemia, remote history of A. fib not on anticoagulation who was brought in by EMS as a stroke code for severe global aphasia and R sided weakness.  CTH NAICP w/ ASPECTS 10. CTA H&N showed distal L M2. CTP showed no core infarct w/ 90 cc perfusion deficit. He walks with a walker at baseline but per wife has no  baseline speech or cognitive deficits. mRS = 3. I discussed the case with Dr. Loreta Ave at [facility] and we agreed that bc patient's new deficits would be so severely disabling it would be reasonable to offer intervention. I discussed the benefits and risks (including 10% risk of intracranial hemorrhage) of thrombectomy with his wife June at [phone] who did consent to procedure.   Recommendations   - STAT transfer to [facility] for neuro-IR. D/w Dr. Loreta Ave and Dr. Derry Lory - Further mgmt per [facility] neurology and neurointerventional teams ______________________________________________________________________   Thank you for the opportunity to take part in the care of this patient. If you have any further questions, please contact the neurology consultation attending.  Signed,  Bing Neighbors, MD [facility] [phone]  If 7pm- 7am, please page neurology on call as listed in AMION.

## 2021-06-19 NOTE — ED Notes (Signed)
EMTALA reviewed by this Charge RN and all documentation is updated and pt is ready for transport. EMS at bedside

## 2021-06-19 NOTE — ED Notes (Signed)
Called code stroke to [facility]  per dr. Cyril Loosen [phone]

## 2021-06-19 NOTE — Anesthesia Procedure Notes (Signed)
Procedure Name: Intubation Date/Time: 06/19/2021 2:06 PM Performed by: Gaylene Brooks, CRNA Pre-anesthesia Checklist: Patient identified, Emergency Drugs available, Suction available and Patient being monitored Patient Re-evaluated:Patient Re-evaluated prior to induction Oxygen Delivery Method: Circle System Utilized Preoxygenation: Pre-oxygenation with 100% oxygen Induction Type: IV induction, Rapid sequence and Cricoid Pressure applied Ventilation: Unable to mask ventilate Laryngoscope Size: Mac and 4 Grade View: Grade I Tube type: Oral Tube size: 8.0 mm Number of attempts: 1 Airway Equipment and Method: Stylet Placement Confirmation: ETT inserted through vocal cords under direct vision, positive ETCO2 and breath sounds checked- equal and bilateral Secured at: 23 cm Tube secured with: Tape Dental Injury: Teeth and Oropharynx as per pre-operative assessment  Comments: Code stroke, RSI performed

## 2021-06-19 NOTE — Anesthesia Procedure Notes (Signed)
Arterial Line Insertion Start/End12/11/2020 2:07 PM, 06/19/2021 2:14 PM Performed by: Lanell Matar, CRNA, CRNA  Patient location: OR. Preanesthetic checklist: patient identified, IV checked, site marked and monitors and equipment checked Emergency situation Lidocaine 1% used for infiltration Left, radial was placed Catheter size: 20 G Hand hygiene performed , maximum sterile barriers used  and Seldinger technique used Allen's test indicative of satisfactory collateral circulation Attempts: 1 Procedure performed without using ultrasound guided technique. Following insertion, dressing applied and Biopatch. Post procedure assessment: normal and unchanged

## 2021-06-19 NOTE — Sedation Documentation (Signed)
SBAR given to [employer], Charity fundraiser in [employer]. All questions answered to satisfaction. Hearing aides left labeled with pt stick in envelope with pts paperwork from [facility] Bella Kennedy, RN aware.

## 2021-06-19 NOTE — Progress Notes (Signed)
Pt agitated, confused, fighting staff, cannot lie still. Dr Derry Lory notified. Orders received. Also reported that Lt pupil 1 mm larger than right, both brisk. Cont to monitor.

## 2021-06-19 NOTE — ED Notes (Addendum)
Pt departed via [facility] to [facility] for transfer

## 2021-06-19 NOTE — Progress Notes (Signed)
Called by RN that patient still agitated and restless and trying to get out of bed.  Of note, patient had left MCA thrombectomy today, postprocedure, he was extubated and admitted to ICU.  However, he became agitated, restless, trying to get up from bed.  Received Benadryl at 6:30 PM and 2 mg Haldol x2 at 6:41 PM and 6:56 PM.  However, patient continued to be agitated, restless and trying to get out of the bed.  On exam, patient restless, moving all extremities symmetrically, trying to get out of the bed.  Right green puncture site still soft no hematoma. Able to say "Nice to see you" but then intangible words, not answer orientation questions, easily distracted.  Pupil equal, reactive, no facial droop, still has left gaze preference, barely cross midline.  Blinking to visual threat to the left but inconsistent to the right.  Even agitation and right groin recent puncture, decided to give 3 mg IV Ativan for sedation.  About 5 to 10 minutes after Ativan patient resting.  However BP dropped to 80s, now tapering off Cleviprex, BP goal 1 20-1 40 by cuff pressure. A Line seems not working.  Also RN reported saturation hanging at 88 to 92%, on facemask.  Consulted CCM to be on board for close monitoring, intubation if needed.  MRI and MRA pending at this time.  We will follow  Marvel Plan, MD PhD Stroke Neurology 06/19/2021 9:07 PM  This patient is critically ill due to left MCA stroke status post thrombectomy, accreditation agitation and restless and at significant risk of neurological worsening, death form recurrent stroke, hemorrhagic conversion, groin hematoma. This patient's care requires constant monitoring of vital signs, hemodynamics, respiratory and cardiac monitoring, review of multiple databases, neurological assessment, discussion with family, other specialists and medical decision making of high complexity. I spent 30 minutes of neurocritical care time in the care of this patient.  I also discussed  with CCM.

## 2021-06-19 NOTE — Progress Notes (Signed)
Orthopedic Tech Progress Note Patient Details:  Devon Elliott 01-23-1927 [id number]  RN called requesting a KNEE IMMOBILIZER to patient   Ortho Devices Type of Ortho Device: Knee Immobilizer Ortho Device/Splint Location: RLE Ortho Device/Splint Interventions: Application   Post Interventions Patient Tolerated: Well Instructions Provided: Care of device  Donald Pore 06/19/2021, 6:38 PM

## 2021-06-19 NOTE — Progress Notes (Signed)
SLP Cancellation Note  Patient Details Name: Devon Elliott. MRN: [id number] DOB: 1926/12/07   Cancelled treatment:       Reason Eval/Treat Not Completed: Patient at procedure or test/unavailable. Pt OTF in IR. Per chart review, pt with possible transfer to [facility]. SLP to f/u, as appropriate.  Clyde Canterbury, M.S., CCC-SLP Speech-Language Pathologist [facility] [phone] Arnette Felts)  Woodroe Chen 06/19/2021, 2:04 PM

## 2021-06-19 NOTE — H&P (Signed)
NEUROLOGY CONSULTATION NOTE   Date of service: June 19, 2021 Patient Name: Devon Elliott. MRN:  [id number] DOB:  1927/06/13 _ _ _   _ __   _ __ _ _  __ __   _ __   __ _  History of Present Illness  Devon Lorentzen. is a 87 y.o. male with PMH significant for hypertension, atrial fibrillation on anticoagulation who initially presented to [facility] for aphasia.  Patient was evaluated by Dr. Lorrene Reid with neurology team at [facility]. Per wife, had speech difficulty yesterday afternoon and not feeling went. Woke up this AM and was fine. Dressed himself and sat on the recliner when he had acute onset and severe word finding difficulties with some right-sided weakness.  This happened around 0900 on 06/19/2021.  EMS was called and patient was brought into [facility] where CT head without contrast with aspects of 10.  CT angio head and neck with distal left MCA M2 occlusion. CTP with no core and a 86mls mismatch.  Case was discussed with patient's wife and with Neuro IR and given the disabling nature of his deficit, he was transferred to [facility] for emergent thrombectomy.   mRS: 3 tNKAse: outside window Thrombectomy: Yes. Case discussed with Dr. Earleen Newport with the Neuro IR team and transferred for emergent thrombectomy. NIHSS components Score: Comment  1a Level of Conscious 0[x]  1[]  2[]  3[]      1b LOC Questions 0[]  1[]  2[x]       1c LOC Commands 0[]  1[]  2[x]       2 Best Gaze 0[x]  1[]  2[]       3 Visual 0[]  1[]  2[x]  3[]      4 Facial Palsy 0[]  1[x]  2[]  3[]      5a Motor Arm - left 0[x]  1[]  2[]  3[]  4[]  UN[]    5b Motor Arm - Right 0[x]  1[]  2[]  3[]  4[]  UN[]    6a Motor Leg - Left 0[]  1[]  2[x]  3[]  4[]  UN[]    6b Motor Leg - Right 0[]  1[]  2[x]  3[]  4[]  UN[]    7 Limb Ataxia 0[x]  1[]  2[]  3[]  UN[]     8 Sensory 0[x]  1[]  2[]  UN[]      9 Best Language 0[]  1[]  2[]  3[x]      10 Dysarthria 0[x]  1[]  2[]  UN[]      11 Extinct. and Inattention 0[]  1[]  2[x]       TOTAL: 16     ROS    Unable to obtain due to aphasia.  Past History   Past Medical History:  Diagnosis Date   Complete tear of left rotator cuff 07/21/2014   Essential hypertension 04/19/2014   Impingement syndrome of left shoulder 07/21/2014   Left hip pain 02/08/2016   Left supraspinatus tenosynovitis 07/21/2014   Pure hypercholesterolemia 04/19/2014   Past Surgical History:  Procedure Laterality Date   ELBOW SURGERY  1995   ESOPHAGOGASTRODUODENOSCOPY N/A 05/01/2020   Procedure: ESOPHAGOGASTRODUODENOSCOPY (EGD);  Surgeon: Toledo, Benay Pike, MD;  Location: [facility] ENDOSCOPY;  Service: Gastroenterology;  Laterality: N/A;   ESOPHAGOGASTRODUODENOSCOPY (EGD) WITH PROPOFOL N/A 05/31/2016   Procedure: ESOPHAGOGASTRODUODENOSCOPY (EGD) WITH PROPOFOL;  Surgeon: Jonathon Bellows, MD;  Location: [facility] ENDOSCOPY;  Service: Endoscopy;  Laterality: N/A;   KYPHOPLASTY N/A 07/30/2019   Procedure: L1 KYPHOPLASTY;  Surgeon: Hessie Knows, MD;  Location: [facility] ORS;  Service: Orthopedics;  Laterality: N/A;   Family History  Problem Relation Age of Onset   Pancreatic cancer Mother    Diabetes Father    Social History   Socioeconomic History  Marital status: Married    Spouse name: Not on file   Number of children: Not on file   Years of education: Not on file   Highest education level: Not on file  Occupational History   Not on file  Tobacco Use   Smoking status: Former   Smokeless tobacco: Never  Substance and Sexual Activity   Alcohol use: Yes    Comment: social   Drug use: No   Sexual activity: Not on file  Other Topics Concern   Not on file  Social History Narrative   Not on file   Social Determinants of Health   Financial Resource Strain: Not on file  Food Insecurity: Not on file  Transportation Needs: Not on file  Physical Activity: Not on file  Stress: Not on file  Social Connections: Not on file   Allergies  Allergen Reactions   Ace Inhibitors     Other reaction(s): Kidney Disorder   Clarithromycin  Hives   Etodolac     Other reaction(s): Kidney Disorder   Levofloxacin Swelling    Medications  (Not in a hospital admission)    Vitals   Vitals:   06/19/21 1154 06/19/21 1155 06/19/21 1313  BP:  (!) 163/98 (!) 170/98  Pulse:  70 90  Resp:  18 18  Temp:  98.4 F (36.9 C) 98.3 F (36.8 C)  TempSrc:  Axillary   SpO2:  100% 94%  Weight: 67.2 kg       Body mass index is 21.88 kg/m.  Physical Exam   General: Laying comfortably in bed; in no acute distress.  HENT: Normal oropharynx and mucosa. Normal external appearance of ears and nose.  Neck: Supple, no pain or tenderness  CV: No JVD. No peripheral edema.  Pulmonary: Symmetric Chest rise. Normal respiratory effort.  Abdomen: Soft to touch, non-tender.  Ext: No cyanosis, edema, or deformity  Skin: No rash. Normal palpation of skin.   Musculoskeletal: Normal digits and nails by inspection. No clubbing.   Neurologic Examination  Mental status/Cognition: Alert, eyes open and makes eye contact on the right. Unable to answer any orientation questions. Speech/language: Fluent, perseverates, unable to comprehend, unable to repeat. Cranial nerves:   CN II Pupils equal and reactive to light, left hemianopsia vs visual neglect.   CN III,IV,VI EOM intact to dolls eyes.   CN V normal sensation in V1, V2, and V3 segments bilaterally   CN VII R facial droop, mild.   CN VIII normal hearing to speech   CN IX & X    CN XI 5/5 head turn and 5/5 shoulder shrug bilaterally   CN XII    Motor:  Muscle bulk: normal, tone normal.  Unable to do detailed strength testing due to acuity of the situation and aphasia making it difficult for him to comprehend or follow any commands.  He is able to keep bilateral upper extremities off the bed for more than 10 seconds without any obvious drift. Bilateral lower extremities with some antigravity movement but both fall to the bed within 3 seconds.  Sensory: Again difficult to assess given  aphasia. Concern for right sided neglect. Grimaces to pain in BL uppers and lower extremities.  Reflexes:  Right Left Comments  Pectoralis      Biceps (C5/6) 2 2   Brachioradialis (C5/6) 2 2    Triceps (C6/7) 2 2    Patellar (L3/4) 2 2    Achilles (S1)      Hoffman  Plantar     Jaw jerk    Coordination/Complex Motor:  Unable to obtain 2/2 neglect and aphasia. - Gait: unsafe to assess given acute left MCA stroke.  Labs   CBC:  Recent Labs  Lab 06/19/21 1207  WBC 6.7  NEUTROABS 5.1  HGB 12.7*  HCT 39.1  MCV 94.7  PLT [id number]    Basic Metabolic Panel:  Lab Results  Component Value Date   NA 140 06/19/2021   K 4.3 06/19/2021   CO2 28 06/19/2021   GLUCOSE 113 (H) 06/19/2021   BUN 19 06/19/2021   CREATININE 1.12 06/19/2021   CALCIUM 8.6 (L) 06/19/2021   GFRNONAA >60 06/19/2021   GFRAA >60 07/13/2019   Lipid Panel: No results found for: LDLCALC HgbA1c: No results found for: HGBA1C Urine Drug Screen:     Component Value Date/Time   LABOPIA NONE DETECTED 06/19/2021 1315   COCAINSCRNUR NONE DETECTED 06/19/2021 1315   LABBENZ POSITIVE (A) 06/19/2021 1315   AMPHETMU NONE DETECTED 06/19/2021 1315   [place] 06/19/2021 1315   LABBARB NONE DETECTED 06/19/2021 1315    Alcohol Level     Component Value Date/Time   ETH <10 06/19/2021 1207    CT Head without contrast(Personally reviewed): CTH was negative for a large hypodensity concerning for a large territory infarct or hyperdensity concerning for an ICH  CT angio Head and Neck with contrast(Personally reviewed): Left MCA distal M2 occlusion.  MRI Brain: pending  Impression   Devon Vose. is a 87 y.o. male with PMH significant for A. fib not on anticoagulation, hypertension who presents with aphasia, right-sided vision and sensory deficit/neglect.  He was outside window for [facility] at the time of presentation.  CT angio with a distal left MCA M2 occlusion with a large mismatch on CT  perfusion.  He is being taken for thrombectomy with plan for admission to ICU post thrombectomy.  Primary Diagnosis:  Cerebral infarction due to embolism of  left middle cerebral artery.   Secondary Diagnosis: Essential (primary) hypertension  Recommendations    Acute Left MCA stroke: - Frequent Neuro checks per stroke unit protocol - Brain imaging with MRI Brain without contrast - TTE - Lipid panel with LDL - will start statin if LDL > 70 - HbA1c - Antithrombotic - Per Neuro IR for the first 24 hours after thrombectomy. - Recommend DVT ppx - SBP goal - Per Neuro IR for the first 24 hours after thrombectomy. Hold home medications. - Recommend Telemetry monitoring for arrythmia - Recommend bedside swallow screen prior to PO intake. - Stroke education booklet - Recommend PT/OT/SLP consult - Recommend Urine Tox screen.   Hypertension: - Goal SBP per Neuro IR for first 24 hours. Will hold home medications.  GERD: - Hold home Protonix.  This patient is critically ill and at significant risk of neurological worsening, death and care requires constant monitoring of vital signs, hemodynamics,respiratory and cardiac monitoring, neurological assessment, discussion with family, other specialists and medical decision making of high complexity. I spent 35 minutes of neurocritical care time  in the care of  this patient. This was time spent independent of any time provided by nurse practitioner or PA.  Donnetta Simpers [facility] Pager Number [phone] 06/19/2021  2:23 PM  ______________________________________________________________________   Thank you for the opportunity to take part in the care of this patient. If you have any further questions, please contact the neurology consultation attending.  Signed,  [place] Pager Number [phone] _ _  _   _ __   _ __ _ _  __ __   _ __   __ _  

## 2021-06-19 NOTE — H&P (Deleted)
  The note originally documented on this encounter has been moved the the encounter in which it belongs.  

## 2021-06-19 NOTE — ED Notes (Signed)
Verbal consent given for transport to [name] via [facility] by wife.

## 2021-06-19 NOTE — Anesthesia Preprocedure Evaluation (Deleted)
Anesthesia Evaluation  Patient identified by MRN, date of birth, ID band  Reviewed: Allergy & Precautions, Patient's Chart, lab work & pertinent test results, reviewed documented beta blocker date and time Preop documentation limited or incomplete due to emergent nature of procedure.  Airway        Dental   Pulmonary former smoker,           Cardiovascular hypertension, Pt. on medications and Pt. on home beta blockers      Neuro/Psych CODE STROKE CVA negative psych ROS   GI/Hepatic Neg liver ROS, PUD, GERD  Medicated and Controlled,  Endo/Other  negative endocrine ROS  Renal/GU negative Renal ROS  negative genitourinary   Musculoskeletal  (+) Arthritis , Osteoarthritis,    Abdominal   Peds  Hematology negative hematology ROS (+) hct 39.1, plt 172   Anesthesia Other Findings   Reproductive/Obstetrics negative OB ROS                             Anesthesia Physical Anesthesia Plan  ASA: 3 and emergent  Anesthesia Plan: General   Post-op Pain Management:    Induction: Intravenous  PONV Risk Score and Plan: Treatment may vary due to age or medical condition  Airway Management Planned: Oral ETT  Additional Equipment: Arterial line  Intra-op Plan:   Post-operative Plan: Extubation in OR and Possible Post-op intubation/ventilation  Informed Consent: I have reviewed the patients History and Physical, chart, labs and discussed the procedure including the risks, benefits and alternatives for the proposed anesthesia with the patient or authorized representative who has indicated his/her understanding and acceptance.     Dental advisory given  Plan Discussed with: CRNA  Anesthesia Plan Comments:         Anesthesia Quick Evaluation

## 2021-06-19 NOTE — Progress Notes (Signed)
   06/19/21 1245  Clinical Encounter Type  Visited With Patient  Visit Type Initial;Spiritual support;Social support  Referral From Nurse  Consult/Referral To Chaplain   Chaplain responded to code stroke page. Checked in on staff and PT. PT was actively being assessed by medical team.  No family present at the moment. Chaplain not needed at the moment.   Posey Boyer, MDiv

## 2021-06-19 NOTE — Anesthesia Preprocedure Evaluation (Signed)
Anesthesia Evaluation  Patient identified by MRN, date of birth, ID band Patient confused    Reviewed: Unable to perform ROS - Chart review only  History of Anesthesia Complications Negative for: history of anesthetic complications  Airway Mallampati: III  TM Distance: >3 FB Neck ROM: Full    Dental  (+) Poor Dentition   Pulmonary former smoker,    breath sounds clear to auscultation       Cardiovascular hypertension, Pt. on medications + dysrhythmias Atrial Fibrillation  Rhythm:Irregular     Neuro/Psych CVA negative psych ROS   GI/Hepatic GERD  ,  Endo/Other  negative endocrine ROS  Renal/GU Lab Results      Component                Value               Date                      CREATININE               1.12                06/19/2021           Lab Results      Component                Value               Date                      K                        4.3                 06/19/2021                Musculoskeletal  (+) Arthritis ,   Abdominal   Peds  Hematology  (+) Blood dyscrasia, anemia , Lab Results      Component                Value               Date                      WBC                      6.7                 06/19/2021                HGB                      12.7 (L)            06/19/2021                HCT                      39.1                06/19/2021                MCV                      94.7  06/19/2021                PLT                      172                 06/19/2021              Anesthesia Other Findings   Reproductive/Obstetrics                            Anesthesia Physical Anesthesia Plan  ASA: 4 and emergent  Anesthesia Plan: General   Post-op Pain Management: Minimal or no pain anticipated   Induction: Intravenous, Rapid sequence and Cricoid pressure planned  PONV Risk Score and Plan: 2 and Treatment may vary due to age or  medical condition, Ondansetron and Dexamethasone  Airway Management Planned: Oral ETT  Additional Equipment: Arterial line  Intra-op Plan:   Post-operative Plan: Possible Post-op intubation/ventilation  Informed Consent:     History available from chart only and Only emergency history available  Plan Discussed with: CRNA, Anesthesiologist and Surgeon  Anesthesia Plan Comments:         Anesthesia Quick Evaluation

## 2021-06-19 NOTE — Consult Note (Signed)
NAME:  Devon Trumbull., MRN:  [id number], DOB:  December 28, 1926, LOS: 0 ADMISSION DATE:  06/19/2021, CONSULTATION DATE:  06/19/21 REFERRING MD:  Roda Shutters CHIEF COMPLAINT:  AMS   History of Present Illness:  Devon Elliott. is a 87 y.o. male who has a PMH as outlined below including but not limited to HTN, HLD, AF not on AC.  He presented to [facility] 12/5 with aphasia and right sided weakness. CTA showed distal left M2 occlusion.  He was brought to [facility] for NIR mechanical thrombectomy.  He did not receive tPA as he was deemed to be outside of the window.  Later that evening, he had agitation and restlessness. He received 25mg  Diphenhydramine at 1835 followed by 2mg  Haldol at 1841 and 1856, 3mg  Lorazepam at 1947.  He then became somnolent and later on had mild desaturations into the 80s on 3L [place].  His O2 was changed to NRB with improvement in sats to 100; however, mental status remained depressed and he was minimally responsive to noxious stimuli.  Per RN, prior to this he was awake and agitated, moving all extremities.  PCCM subsequently called and asked to see in consultation for concern of airway protection.  Per chart review, he carries no hx of seizures and he is not on any benzo's chronically.   Pertinent  Medical History:  has Complete tear of left rotator cuff; Essential hypertension; Impingement syndrome of left shoulder; Left hip pain; Left supraspinatus tenosynovitis; Pure hypercholesterolemia; Oral phase dysphagia; Esophageal dysphagia; Esophageal mass; AKI (acute kidney injury) (HCC); Hypernatremia; Cellulitis of left leg; Gastroesophageal reflux disease without esophagitis; Esophageal ulcer; Hypokalemia; AF (paroxysmal atrial fibrillation) (HCC); Cerebrovascular accident (CVA) (HCC); and Acute ischemic left MCA stroke (HCC) on their problem list.  Significant Hospital Events: Including procedures, antibiotic start and stop dates in addition to other pertinent events   12/5 > admit, to [facility]  for mechanical thrombectomy.  Interim History / Subjective:  Sedate, minimally responsive to noxious stimuli. Vitals stable.  Chart reviewed.  No hx seizures, not on benzos chronically.  Will therefore try Romazicon for benzo reversal.  Objective:  Blood pressure 118/78, pulse (!) 101, temperature 98.3 F (36.8 C), temperature source Oral, resp. rate 18, SpO2 93 %.        Intake/Output Summary (Last 24 hours) at 06/19/2021 2127 Last data filed at 06/19/2021 1900 Gross per 24 hour  Intake 855.04 ml  Output 420 ml  Net 435.04 ml   There were no vitals filed for this visit.  Examination: General: Elderly male, sedate, in NAD. Neuro: Sedated, minimally responsive to noxious stimuli. HEENT: [place]/AT. Sclerae anicteric. MM moist. Cardiovascular: IRIR, no M/R/G.  Lungs: Respirations even and unlabored.  CTA bilaterally, No W/R/R. Abdomen: BS x 4, soft, NT/ND.  Musculoskeletal: No gross deformities, no edema.  Skin: Intact, warm, no rashes.  Labs/imaging personally reviewed:  CTA head 12/5 > distal L M2 occlusion, 68ml penumbra.  Mod stenosis R M1 MCA, mod to marked stenosis distal L P2 PCA. MRI brain 12/6 >  Echo 12/6 >   Assessment & Plan:   L M2 occlusion - s/p NIR mechanical thrombectomy. - Per neuro.  AMS - presumed iatrogenic 2/2 polypharmacy in elderly pt (received 25mg  Diphenhydramine at 1835 followed by 2mg  Haldol at 1841 and 1856, 3mg  Lorazepam at 1947).  He has no hx of seizures and is not on benzos chronically. - Romazicon 0.2mg  now and consider repeat based on response. - Avoid sedating meds. - If requires control of  agitation, can consider Precedex.  Hx HTN, HLD, AF (not on AC). - Clevidipine PRN for goal SBP 120 - 140. - Hold home Toprol-XL.   Rest per primary team.  Best practice (evaluated daily):  Per Primary.  Labs   CBC: Recent Labs  Lab 06/19/21 1207 06/19/21 1710  WBC 6.7 9.3  NEUTROABS 5.1  --   HGB 12.7* 12.2*  HCT 39.1 37.0*  MCV 94.7  93.0  PLT 172 [id number]    Basic Metabolic Panel: Recent Labs  Lab 06/19/21 1207 06/19/21 1710  NA 140  --   K 4.3  --   CL 109  --   CO2 28  --   GLUCOSE 113*  --   BUN 19  --   CREATININE 1.12 1.22  CALCIUM 8.6*  --    GFR: CrCl cannot be calculated (Unknown ideal weight.). Recent Labs  Lab 06/19/21 1207 06/19/21 1710  WBC 6.7 9.3    Liver Function Tests: Recent Labs  Lab 06/19/21 1207  AST 21  ALT 10  ALKPHOS 56  BILITOT 0.6  PROT 6.3*  ALBUMIN 3.8   No results for input(s): LIPASE, AMYLASE in the last 168 hours. No results for input(s): AMMONIA in the last 168 hours.  ABG No results found for: PHART, PCO2ART, PO2ART, HCO3, TCO2, ACIDBASEDEF, O2SAT   Coagulation Profile: Recent Labs  Lab 06/19/21 1207  INR 1.0    Cardiac Enzymes: No results for input(s): CKTOTAL, CKMB, CKMBINDEX, TROPONINI in the last 168 hours.  HbA1C: No results found for: HGBA1C  CBG: No results for input(s): GLUCAP in the last 168 hours.  Review of Systems:   Unable to obtain as pt is encephalopathic.  Past Medical History:  He,  has a past medical history of Complete tear of left rotator cuff (07/21/2014), Essential hypertension (04/19/2014), Impingement syndrome of left shoulder (07/21/2014), Left hip pain (02/08/2016), Left supraspinatus tenosynovitis (07/21/2014), and Pure hypercholesterolemia (04/19/2014).   Surgical History:   Past Surgical History:  Procedure Laterality Date   ELBOW SURGERY  1995   ESOPHAGOGASTRODUODENOSCOPY N/A 05/01/2020   Procedure: ESOPHAGOGASTRODUODENOSCOPY (EGD);  Surgeon: Toledo, Benay Pike, MD;  Location: [facility] ENDOSCOPY;  Service: Gastroenterology;  Laterality: N/A;   ESOPHAGOGASTRODUODENOSCOPY (EGD) WITH PROPOFOL N/A 05/31/2016   Procedure: ESOPHAGOGASTRODUODENOSCOPY (EGD) WITH PROPOFOL;  Surgeon: Jonathon Bellows, MD;  Location: [facility] ENDOSCOPY;  Service: Endoscopy;  Laterality: N/A;   KYPHOPLASTY N/A 07/30/2019   Procedure: L1 KYPHOPLASTY;  Surgeon: Hessie Knows, MD;  Location: [facility] ORS;  Service: Orthopedics;  Laterality: N/A;     Social History:   reports that he has quit smoking. He has never used smokeless tobacco. He reports current alcohol use. He reports that he does not use drugs.   Family History:  His family history includes Diabetes in his father; Pancreatic cancer in his mother.   Allergies Allergies  Allergen Reactions   Ace Inhibitors     Other reaction(s): Kidney Disorder   Clarithromycin Hives   Etodolac     Other reaction(s): Kidney Disorder   Levofloxacin Swelling     Home Medications  Prior to Admission medications   Medication Sig Start Date End Date Taking? Authorizing Provider  acetaminophen (TYLENOL) 500 MG tablet Take 1,000 mg by mouth as needed.    [provider]  metoCLOPramide (REGLAN) 5 MG tablet Take 5 mg by mouth at bedtime. 04/29/20   [provider]  metoprolol succinate (TOPROL-XL) 25 MG 24 hr tablet Take 1 tablet (25 mg total) by mouth daily. 05/01/20  Loletha Grayer, MD  pantoprazole (PROTONIX) 40 MG tablet Take 1 tablet (40 mg total) by mouth daily. 05/01/20   Loletha Grayer, MD  tiZANidine (ZANAFLEX) 2 MG tablet Take 2 mg by mouth 2 (two) times daily. 03/30/20   [provider]  traMADol (ULTRAM) 50 MG tablet Take 50 mg by mouth 2 (two) times daily as needed. 04/06/20   [provider]     Critical care time: N/A.   Montey Hora, [facility] For pager details, please see AMION or use Epic chat  After 1900, please call [facility] for cross coverage needs 06/19/2021, 9:27 PM

## 2021-06-19 NOTE — ED Notes (Signed)
Called [facility] for emergency transport to IR lab in [place]  1308

## 2021-06-19 NOTE — Transfer of Care (Signed)
Immediate Anesthesia Transfer of Care Note  Patient: Devon Elliott.  Procedure(s) Performed: IR WITH ANESTHESIA  Patient Location: PACU  Anesthesia Type:General  Level of Consciousness: awake  Airway & Oxygen Therapy: Patient Spontanous Breathing and Patient connected to face mask oxygen  Post-op Assessment: Report given to RN, Post -op Vital signs reviewed and stable, Patient moving all extremities X 4 and Patient able to stick tongue midline  Post vital signs: Reviewed and stable  Last Vitals:  Vitals Value Taken Time  BP 138/69 06/19/21 1535  Temp    Pulse 70 06/19/21 1535  Resp 23 06/19/21 1535  SpO2 97 % 06/19/21 1535  Vitals shown include unvalidated device data.  Last Pain: There were no vitals filed for this visit.       Complications: No notable events documented.

## 2021-06-19 NOTE — ED Triage Notes (Addendum)
Pt arrived via EMS. Per EMS pt LKW was at 0915 today. Pt has been very combative towards EMS and received 4mg  of versed, BGL 88. Pt was restrained while in EMS care. Pt is resting on ED stretcher at this time. Pt has hx of dementia.

## 2021-06-19 NOTE — Code Documentation (Signed)
Stroke Response Nurse Documentation Code Documentation  Jonanthan Bolender. is a 87 y.o. male arriving to [facility] ED via  EMS on 06/19/2021 with past medical hx of htn, hyperlipidemia, and afib, not on anticoagulation. Code stroke was activated by ED MD.   Patient from home where he was LKW yesterday initially and then worsened around 0900 this am. Per pts wife, whom with which pt lives with, began having difficulty forming words yesterday and this am he had worsening of sx's.   Stroke team at the bedside on patient arrival to CT. Labs drawn and patient cleared for CT by Dr. Cyril Loosen. Patient to CT with team. CT head, CTA and CTP was completed. Patient is not a candidate for IV Thrombolytic due to LKW >4.5 hrs  Care/Plan: Pt noted to have left M2 occlusion and was emergently transferred to [facility] via [facility].   Bedside handoff with ED RN Melissa.    Beverly Milch Stroke Airline pilot

## 2021-06-19 NOTE — ED Provider Notes (Signed)
[facility] Emergency Department Provider Note   ____________________________________________    I have reviewed the triage vital signs and the nursing notes.   HISTORY  Chief Complaint Altered Mental Status  History limited   HPI Devon Elliott. is a 87 y.o. male who presents with altered mental status reports of combative behavior.  EMS apparently gave 1 mg of IM Versed.  Normal state of health at 915 this morning, seem to change somewhat rapidly, no further history available at this time.  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   AKI (acute kidney injury) (HCC)    Hypernatremia    Cellulitis of left leg    Gastroesophageal reflux disease without esophagitis    Oral phase dysphagia 05/22/2016   Esophageal dysphagia 05/22/2016   Left hip pain 02/08/2016   Complete tear of left rotator cuff 07/21/2014   Impingement syndrome of left shoulder 07/21/2014   Left supraspinatus tenosynovitis 07/21/2014   Essential hypertension 04/19/2014   Pure hypercholesterolemia 04/19/2014    Past Surgical History:  Procedure Laterality Date   ELBOW SURGERY  1995   ESOPHAGOGASTRODUODENOSCOPY N/A 05/01/2020   Procedure: ESOPHAGOGASTRODUODENOSCOPY (EGD);  Surgeon: Toledo, Boykin Nearing, MD;  Location: [facility] ENDOSCOPY;  Service: Gastroenterology;  Laterality: N/A;   ESOPHAGOGASTRODUODENOSCOPY (EGD) WITH PROPOFOL N/A 05/31/2016   Procedure: ESOPHAGOGASTRODUODENOSCOPY (EGD) WITH PROPOFOL;  Surgeon: Wyline Mood, MD;  Location:  [facility] ENDOSCOPY;  Service: Endoscopy;  Laterality: N/A;   KYPHOPLASTY N/A 07/30/2019   Procedure: L1 KYPHOPLASTY;  Surgeon: Kennedy Bucker, MD;  Location: [facility] ORS;  Service: Orthopedics;  Laterality: N/A;    Prior to Admission medications   Medication Sig Start Date End Date Taking? Authorizing Provider  acetaminophen (TYLENOL) 500 MG tablet Take 1,000 mg by mouth as needed.    [provider]  metoCLOPramide (REGLAN) 5 MG tablet Take 5 mg by mouth at bedtime. 04/29/20   [provider]  metoprolol succinate (TOPROL-XL) 25 MG 24 hr tablet Take 1 tablet (25 mg total) by mouth daily. 05/01/20   Alford Highland, MD  pantoprazole (PROTONIX) 40 MG tablet Take 1 tablet (40 mg total) by mouth daily. 05/01/20   Alford Highland, MD  tiZANidine (ZANAFLEX) 2 MG tablet Take 2 mg by mouth 2 (two) times daily. 03/30/20   [provider]  traMADol (ULTRAM) 50 MG tablet Take 50 mg by mouth 2 (two) times daily as needed. 04/06/20   [provider]     Allergies Ace inhibitors, Clarithromycin, Etodolac, and Levofloxacin  Family History  Problem Relation Age of Onset   Pancreatic cancer Mother    Diabetes Father     Social History Social History   Tobacco Use   Smoking status: Former   Smokeless tobacco: Never  Substance Use Topics   Alcohol use: Yes    Comment: social   Drug use: No    Unable to obtain due to altered mental status review of Systems     ____________________________________________   PHYSICAL EXAM:  VITAL SIGNS: ED Triage Vitals  Enc Vitals Group  BP 06/19/21 1155 (!) 163/98     Pulse Rate 06/19/21 1155 70     Resp 06/19/21 1155 18     Temp 06/19/21 1155 98.4 F (36.9 C)     Temp Source 06/19/21 1155 Axillary     SpO2 06/19/21 1155 100 %     Weight 06/19/21 1154 67.2 kg (148 lb 2.4 oz)     Height --      Head Circumference --      Peak Flow --      Pain Score 06/19/21 1154 0     Pain Loc --      Pain Edu? --      Excl.  in [place]? --     Constitutional: Alert but disoriented Eyes: Conjunctivae are normal.  Head: Atraumatic. Nose: No congestion/rhinnorhea. Mouth/Throat: Mucous membranes are moist.   Neck:  Painless ROM Cardiovascular: Normal rate, regular rhythm. Grossly normal heart sounds.  Good peripheral circulation. Respiratory: Normal respiratory effort.  No retractions.  Gastrointestinal: Soft and nontender. No distention.   Genitourinary: deferred Musculoskeletal: Skin tear to the right wrist Neurologic: Speech is nonsensical, appears to be moving all extremities, does not seem to cross midline towards the right Skin:  Skin is warm, dry, as above Psychiatric: Mood and affect are normal. Speech and behavior are normal.  ____________________________________________   LABS (all labs ordered are listed, but only abnormal results are displayed)  Labs Reviewed  CBC - Abnormal; Notable for the following components:      Result Value   RBC 4.13 (*)    Hemoglobin 12.7 (*)    All other components within normal limits  COMPREHENSIVE METABOLIC PANEL - Abnormal; Notable for the following components:   Glucose, Bld 113 (*)    Calcium 8.6 (*)    Total Protein 6.3 (*)    Anion gap 3 (*)    All other components within normal limits  RESP PANEL BY RT-PCR (FLU A&B, COVID) ARPGX2  ETHANOL  PROTIME-INR  APTT  DIFFERENTIAL  URINE DRUG SCREEN, QUALITATIVE ([facility] ONLY)  URINALYSIS, ROUTINE W REFLEX MICROSCOPIC   ____________________________________________  EKG  ED ECG REPORT I, Lavonia Drafts, the attending physician, personally viewed and interpreted this ECG.  Date: 06/19/2021  Rhythm: Atrial fibrillation QRS Axis: normal Intervals: Abnormal  ST/T Wave abnormalities: normal Narrative Interpretation: no evidence of acute ischemia  ____________________________________________  RADIOLOGY  CT head reviewed by me, no acute  abnormality ____________________________________________   PROCEDURES  Procedure(s) performed: No  Procedures   Critical Care performed: yes  CRITICAL CARE Performed by: Lavonia Drafts   Total critical care time: 30 minutes  Critical care time was exclusive of separately billable procedures and treating other patients.  Critical care was necessary to treat or prevent imminent or life-threatening deterioration.  Critical care was time spent personally by me on the following activities: development of treatment plan with patient and/or surrogate as well as nursing, discussions with consultants, evaluation of patient's response to treatment, examination of patient, obtaining history from patient or surrogate, ordering and performing treatments and interventions, ordering and review of laboratory studies, ordering and review of radiographic studies, pulse oximetry and re-evaluation of patient's condition.  ____________________________________________   INITIAL IMPRESSION / ASSESSMENT AND PLAN / ED COURSE  Pertinent labs & imaging results that were available during my care of the patient were reviewed by me and considered in my medical decision making (see chart for details).   Patient presents with reports of altered mental status, combative behavior.  Reportedly significant  change from his typical behavior/mental status vague reports that the patient is able to easily carry on conversations, he is not able do that currently, concern for stroke, code stroke activated given last known well at 9:15 AM.  Sent for CT head    ____________________________________________   FINAL CLINICAL IMPRESSION(S) / ED DIAGNOSES  Final diagnoses:  Altered mental status, unspecified altered mental status type  Cerebrovascular accident (CVA), unspecified mechanism (HCC)        Note:  This document was prepared using Dragon voice recognition software and may include unintentional dictation  errors.    Jene Every, MD 06/19/21 [phone]

## 2021-06-19 NOTE — ED Notes (Signed)
Report given verbally over the phone to [place], Stroke response RN at [facility].

## 2021-06-19 NOTE — Progress Notes (Signed)
NeuroInterventional Radiology  Pre-Procedure Note  History: 87 yo male presents with acute stroke symptoms to [facility].   Report of worsening from baseline yesterday (LKW yesterday) with deterioration at 9am today.  Primary symptom is aphasia.   He has a history of a-fib, not taking AC.   His wife June Prudencio Pair tells me that although he uses a walker at baseline, he is otherwise very functional and performs majority of his ADL's.  He is usually vocal and intelligible with speech, with the changes a definite new symptom.  He has "very mild" dementia according to his wife.   CT ASPECTS: 10 CTA:   Left M2 occlusion CTP:   90cc at risk territory, with no core   I have discussed the case with Dr. Selina Cooley of Stroke Neurology.  Given the patient's symptoms, imaging findings, baseline function, we agree that he stands to have a very debilitating symptom of aphasia if stroke goes to completion, and he stands to have significant gain to have revascularization.  Given his profile, he is reasonable candidate for attempt for mechanical thrombectomy.    The risks and benefits of the procedure were discussed with the patient's wife, with specific risks including: bleeding, infection, arterial injury/dissection, contrast reaction, kidney injury, need for further procedure/surgery, neurologic deficit, 10-15% risk of intracranial hemorrhage, cardiopulmonary collapse, death. All questions were answered.  The family would like to proceed with attempt at thrombectomy.     Plan for cerebral angiogram and attempt at mechanical thrombectomy.   Signed,   Yvone Neu. Loreta Ave, DO

## 2021-06-19 NOTE — ED Provider Notes (Signed)
-----------------------------------------   1:21 PM on 06/19/2021 ----------------------------------------- Care assumed from Dr. Cyril Loosen following activation of code stroke.  Patient was evaluated by neurology and found to have left M2 occlusion on CTA.  Wife had stated that patient initially developed abnormal speech pattern during the day yesterday and therefore he is not a candidate for TNK, however he is a candidate for neuro IR intervention.  Dr. Selina Cooley of neurology discussed case with neuro IR at [facility] and patient accepted for transfer.  He remains disoriented, but awake and alert, protecting airway.   Chesley Noon, MD 06/19/21 1323

## 2021-06-19 NOTE — Code Documentation (Signed)
Pt is a 87 yr old male with PMH of AF not on anticoagulation who is transferred to [facility]  via EMS from [facility] for NIR. Pt arrived [facility] at 1347. He is aphasic, neglects the rt, leftward gaze. Per chart, pt was LKW at 0900 this morning after which he became unable to communicate. CTA shows Lt M2 occlusion, ASPECTS 10, CTP shows 0 core and 90 penumbra. See flowsheet for full NIH and timeline. Consent has been obtained per Dr Loreta Ave. Pt taken to IR at 1354. Report given to [employer].

## 2021-06-19 NOTE — Procedures (Signed)
Neuro-Interventional Radiology  Post Cerebral Angiogram Procedure Note  Operator:    Dr. Loreta Ave Assistant:   None  History:   History: 87 yo male presents with acute stroke symptoms to [facility].    Report of worsening from baseline yesterday (LKW yesterday) with deterioration at 9am today.  Primary symptom is aphasia.    He has a history of a-fib, not taking AC.    His wife June Prudencio Pair tells me that although he uses a walker at baseline, he is otherwise very functional and performs majority of his ADL's.  He is usually vocal and intelligible with speech, with the changes a definite new symptom.  He has "very mild" dementia according to his wife  Site of occlusion:  Left M2   Procedure: US guided right CFA access Cervical & Cerebral Angiogram Mechanical Thrombectomy Deployment of Angioseal Flat Panel CT in NIR  First Pass Device:  Direct aspiration, Zoom 35  Result   TICI 0 --> TICI 3  Findings:   Mild athero of the left carotid system  Flat panel CT:   No ICH/SAH  Anesthesia:   GETA  EBL:    50cc     Complication:  None   Medication: IV tPA administered?: no IA Medication:  no  Recommendations: - right hip straight x 4 hrs - Goal SBP 120-140, post reperfusion.   - Frequent NV checks - Repeat CT or MRI imaging recommended within 36 hours, discretion of Neurology - NIR to follow  Signed,  Yvone Neu. Loreta Ave, DO

## 2021-06-20 ENCOUNTER — Inpatient Hospital Stay (HOSPITAL_COMMUNITY): Payer: Medicare Other

## 2021-06-20 ENCOUNTER — Other Ambulatory Visit (HOSPITAL_COMMUNITY): Payer: Medicare Other

## 2021-06-20 DIAGNOSIS — I63512 Cerebral infarction due to unspecified occlusion or stenosis of left middle cerebral artery: Secondary | ICD-10-CM | POA: Diagnosis not present

## 2021-06-20 DIAGNOSIS — I10 Essential (primary) hypertension: Secondary | ICD-10-CM

## 2021-06-20 LAB — HEMOGLOBIN A1C
Hgb A1c MFr Bld: 5.3 % (ref 4.8–5.6)
Mean Plasma Glucose: 105.41 mg/dL

## 2021-06-20 LAB — LIPID PANEL
Cholesterol: 198 mg/dL (ref 0–200)
HDL: 79 mg/dL (ref >40)
LDL Cholesterol: 110 mg/dL — ABNORMAL HIGH (ref 0–99)
Total CHOL/HDL Ratio: 2.5 RATIO
Triglycerides: 44 mg/dL (ref <150)
VLDL: 9 mg/dL (ref 0–40)

## 2021-06-20 LAB — ECHOCARDIOGRAM COMPLETE
AR max vel: 1.36 cm2
AV Area VTI: 1.24 cm2
AV Area mean vel: 1.23 cm2
AV Mean grad: 10 mmHg
AV Peak grad: 17.6 mmHg
Ao pk vel: 2.1 m/s
S' Lateral: 3.6 cm
Weight: 2370.39 oz

## 2021-06-20 MED ORDER — LABETALOL HCL 5 MG/ML IV SOLN
10.0000 mg | INTRAVENOUS | Status: DC | PRN
  Filled 2021-06-20: qty 4

## 2021-06-20 MED ORDER — ASPIRIN 81 MG PO CHEW
81.0000 mg | CHEWABLE_TABLET | Freq: Every day | ORAL | Status: DC
  Administered 2021-06-21 – 2021-06-24 (×4): 81 mg via ORAL
  Filled 2021-06-20 (×5): qty 1

## 2021-06-20 MED ORDER — QUETIAPINE FUMARATE 50 MG PO TABS
25.0000 mg | ORAL_TABLET | Freq: Four times a day (QID) | ORAL | Status: DC | PRN
  Administered 2021-06-21 – 2021-06-26 (×6): 25 mg via ORAL
  Filled 2021-06-20 (×8): qty 1

## 2021-06-20 MED ORDER — ATORVASTATIN CALCIUM 40 MG PO TABS
40.0000 mg | ORAL_TABLET | Freq: Every day | ORAL | Status: DC
  Administered 2021-06-21 – 2021-06-29 (×9): 40 mg via ORAL
  Filled 2021-06-20 (×10): qty 1

## 2021-06-20 MED ORDER — DEXMEDETOMIDINE HCL IN NACL 400 MCG/100ML IV SOLN
0.2000 ug/kg/h | INTRAVENOUS | Status: DC
  Administered 2021-06-20: 0.2 ug/kg/h via INTRAVENOUS
  Filled 2021-06-20: qty 100

## 2021-06-20 MED ORDER — CHLORHEXIDINE GLUCONATE CLOTH 2 % EX PADS
6.0000 | MEDICATED_PAD | Freq: Every day | CUTANEOUS | Status: DC
  Administered 2021-06-20 – 2021-06-29 (×9): 6 via TOPICAL

## 2021-06-20 NOTE — Progress Notes (Signed)
SLP Cancellation Note  Patient Details Name: Devon Elliott. MRN: [id number] DOB: 08-Feb-1927   Cancelled treatment:       Reason Eval/Treat Not Completed: Patient's level of consciousness   Delaney Perona, Riley Nearing 06/20/2021, 8:17 AM

## 2021-06-20 NOTE — Progress Notes (Signed)
PT Cancellation Note  Patient Details Name: Devon Elliott. MRN: [id number] DOB: 12/28/1926   Cancelled Treatment:    Reason Eval/Treat Not Completed: Medical issues which prohibited therapy (haldol/ cleviprex). Will check back at later time.  Lyanne Co, PT  Acute Rehab Services  Pager [phone] Office [phone]    Lawana Chambers Aamani Moose 06/20/2021, 8:13 AM

## 2021-06-20 NOTE — Progress Notes (Signed)
OT Cancellation Note  Patient Details Name: Devon Elliott. MRN: [id number] DOB: [birth year]   Cancelled Treatment:    Reason Eval/Treat Not Completed: Patient not medically ready (haldol / cleviprex)  Wynona Neat, OTR/L  Acute Rehabilitation Services Pager: [phone] Office: [phone] .  06/20/2021, 7:47 AM

## 2021-06-20 NOTE — Evaluation (Signed)
Clinical/Bedside Swallow Evaluation Patient Details  Name: Veasna Santibanez. MRN: [id number] Date of Birth: [birth year]  Today's Date: 06/20/2021 Time: SLP Start Time (ACUTE ONLY): 1030 SLP Stop Time (ACUTE ONLY): 1041 SLP Time Calculation (min) (ACUTE ONLY): 11 min  Past Medical History:  Past Medical History:  Diagnosis Date   Complete tear of left rotator cuff 07/21/2014   Essential hypertension 04/19/2014   Impingement syndrome of left shoulder 07/21/2014   Left hip pain 02/08/2016   Left supraspinatus tenosynovitis 07/21/2014   Pure hypercholesterolemia 04/19/2014   Past Surgical History:  Past Surgical History:  Procedure Laterality Date   ELBOW SURGERY  1995   ESOPHAGOGASTRODUODENOSCOPY N/A 05/01/2020   Procedure: ESOPHAGOGASTRODUODENOSCOPY (EGD);  Surgeon: Toledo, Boykin Nearing, MD;  Location: [facility] ENDOSCOPY;  Service: Gastroenterology;  Laterality: N/A;   ESOPHAGOGASTRODUODENOSCOPY (EGD) WITH PROPOFOL N/A 05/31/2016   Procedure: ESOPHAGOGASTRODUODENOSCOPY (EGD) WITH PROPOFOL;  Surgeon: Wyline Mood, MD;  Location: [facility] ENDOSCOPY;  Service: Endoscopy;  Laterality: N/A;   IR CT HEAD LTD  06/19/2021   IR PERCUTANEOUS ART THROMBECTOMY/INFUSION INTRACRANIAL INC DIAG ANGIO  06/19/2021   IR US GUIDE VASC ACCESS RIGHT  06/19/2021   KYPHOPLASTY N/A 07/30/2019   Procedure: L1 KYPHOPLASTY;  Surgeon: Kennedy Bucker, MD;  Location: [facility] ORS;  Service: Orthopedics;  Laterality: N/A;   RADIOLOGY WITH ANESTHESIA N/A 06/19/2021   Procedure: IR WITH ANESTHESIA;  Surgeon: Radiologist, Medication, MD;  Location: [facility] OR;  Service: Radiology;  Laterality: N/A;   HPI:  87 y/o M who has a PMH as outlined below including but not limited to HTN, HLD, AF not on AC.  He presented to [facility] 12/5 with aphasia and right sided weakness. CTA showed distal left M2 occlusion.  He was brought to [facility] for NIR mechanical thrombectomy.  He did not receive tPA as he was deemed to be outside of the window. Pt has PMHX with Complete tear  of left rotator cuff; Essential hypertension; Impingement syndrome of left shoulder; Left hip pain; Left supraspinatus tenosynovitis; Pure hypercholesterolemia; Oral phase dysphagia; Esophageal dysphagia; Esophageal mass; AKI (acute kidney injury) (HCC); Hypernatremia; Cellulitis of left leg; Gastroesophageal reflux disease without esophagitis; Esophageal ulcer; Hypokalemia; AF (paroxysmal atrial fibrillation) (HCC); Cerebrovascular accident (CVA) (HCC); and Acute ischemic left MCA stroke (HCC).  Pt had MBSS in Dec 2017 showing functional oropharyngal swallowing    Assessment / Plan / Recommendation  Clinical Impression  Pt presents with clinical indicators of pharyngeal dysphagia.  Pt required multiple swallows with thin liquid and there was wet vocal quality after swallow.  Delayed throat clear noted following additional trial.  With nectar thick liquid there was delayed throat clear, but with decreased frequency.  Pt tolerated puree and regular texture solid and exhibited good oral clearance of solids.    Recommend MBSS prior to initiation of PO diet, which is scheduled for later this date.  Pt may have priority oral meds crushed in puree.  SLP Visit Diagnosis: Dysphagia, oropharyngeal phase (R13.12)    Aspiration Risk  Mild aspiration risk    Diet Recommendation NPO   Medication Administration: Whole meds with puree    Other  Recommendations Oral Care Recommendations: Oral care BID    Recommendations for follow up therapy are one component of a multi-disciplinary discharge planning process, led by the attending physician.  Recommendations may be updated based on patient status, additional functional criteria and insurance authorization.  Follow up Recommendations  (Pending results of MBSS)      Assistance Recommended at Discharge  (Pending results of  MBSS)  Functional Status Assessment  (Pending results of MBSS)  Frequency and Duration  (Pending results of MBSS)          Prognosis  Prognosis for Safe Diet Advancement:  (Pending results of MBSS)      Swallow Study   General Date of Onset: 06/19/21 HPI: 87 y/o M who has a PMH as outlined below including but not limited to HTN, HLD, AF not on AC.  He presented to [facility] 12/5 with aphasia and right sided weakness. CTA showed distal left M2 occlusion.  He was brought to [facility] for NIR mechanical thrombectomy.  He did not receive tPA as he was deemed to be outside of the window. Pt has PMHX with Complete tear of left rotator cuff; Essential hypertension; Impingement syndrome of left shoulder; Left hip pain; Left supraspinatus tenosynovitis; Pure hypercholesterolemia; Oral phase dysphagia; Esophageal dysphagia; Esophageal mass; AKI (acute kidney injury) ([place]); Hypernatremia; Cellulitis of left leg; Gastroesophageal reflux disease without esophagitis; Esophageal ulcer; Hypokalemia; AF (paroxysmal atrial fibrillation) ([place]); Cerebrovascular accident (CVA) ([place]); and Acute ischemic left MCA stroke ([place]).  Pt had MBSS in Dec 2017 showing functional oropharyngal swallowing Type of Study: Bedside Swallow Evaluation Previous Swallow Assessment: MBS 2017 Diet Prior to this Study: NPO Temperature Spikes Noted: No Respiratory Status: Nasal cannula History of Recent Intubation: No Behavior/Cognition: Alert;Cooperative;Confused;Doesn't follow directions Oral Cavity Assessment: Within Functional Limits Oral Care Completed by SLP: No Oral Cavity - Dentition: Adequate natural dentition Self-Feeding Abilities: Total assist Patient Positioning: Upright in bed Baseline Vocal Quality: Normal Volitional Cough: Cognitively unable to elicit Volitional Swallow: Unable to elicit    Oral/Motor/Sensory Function Overall Oral Motor/Sensory Function: Mild impairment (Could not assess 2/2 language/cognition impairment) Facial Symmetry: Abnormal symmetry left   Ice Chips Ice chips: Not tested   Thin Liquid Thin Liquid: Impaired Presentation: Cup Pharyngeal   Phase Impairments: Multiple swallows;Throat Clearing - Delayed;Wet Vocal Quality    Nectar Thick Nectar Thick Liquid: Impaired Pharyngeal Phase Impairments: Throat Clearing - Delayed   Honey Thick Honey Thick Liquid: Not tested   Puree Puree: Within functional limits Presentation: Spoon   Solid     Solid: Within functional limits Presentation:  (SLP fed)      Celedonio Savage, [place], [place] Office: [phone]  06/20/2021,10:56 AM

## 2021-06-20 NOTE — Progress Notes (Signed)
Modified Barium Swallow Progress Note  Patient Details  Name: Devon Elliott. MRN: [id number] Date of Birth: 1926/10/07  Today's Date: 06/20/2021  Modified Barium Swallow completed.  Full report located under Chart Review in the Imaging Section.  Brief recommendations include the following:  Clinical Impression  Pt presents with functional oropharyngeal swallow.  There was no penetration or aspiration noted with any consistencies.  Pt noted to clear throat in absence of penetration/aspiration during this evaluation.  During pill simulation pt masticated tablet.  Recommend crushing medicines with puree.  There is a protrusion in the cervical esophagus anterior<posterior below the UES which appears to be soft tissue like.  See picture below.  This abnormality does not affect swallow function.  Bolus flow is not impeded and there was no retention of contrast.  Pt has hx of esophageal dysphagia, esophageal ulcer, esophageal mass, and GERD without esophagitis. Consider GI referral for evaluation of esophagus.  Pt has no further ST.  SLP to follow for speech-language evaluation.     Swallow Evaluation Recommendations   Recommended Consults: Consider GI evaluation   SLP Diet Recommendations: Regular solids;Thin liquid       Medication Administration: Crushed with puree   Supervision: Full assist for feeding   Compensations: Slow rate;Small sips/bites;Minimize environmental distractions   Postural Changes: Seated upright at 90 degrees   Oral Care Recommendations: Oral care BID        Kerrie Pleasure , MA, CCC-SLP Acute Rehabilitation Services Office: [phone]  06/20/2021,1:12 PM

## 2021-06-20 NOTE — Progress Notes (Signed)
Patient unavailable for EEG at this time. Per RN, pt is agitated and they are trying to give meds to calm pt down. RN states today is not a good day and to re-try tomorrow. Will check with nursing in the morning to reassess status.

## 2021-06-20 NOTE — Progress Notes (Signed)
  Echocardiogram 2D Echocardiogram has been performed.  Devon Elliott F 06/20/2021, 1:41 PM

## 2021-06-20 NOTE — Progress Notes (Signed)
   06/20/21 1645  Vitals  BP (!) 173/58  MAP (mmHg) 83   Cleviprex is at max dosage. Contacted Stroke team for further instruction.

## 2021-06-20 NOTE — Progress Notes (Addendum)
NAME:  Mithcell Ozbirn., MRN:  [id number], DOB:  04-24-1927, LOS: 1 ADMISSION DATE:  06/19/2021, CONSULTATION DATE:  06/19/21 REFERRING MD:  Erlinda Hong CHIEF COMPLAINT:  AMS   History of Present Illness:  87 y/o M who has a PMH as outlined below including but not limited to HTN, HLD, AF not on AC.  He presented to [facility] 12/5 with aphasia and right sided weakness. CTA showed distal left M2 occlusion.  He was brought to [facility] for NIR mechanical thrombectomy.  He did not receive tPA as he was deemed to be outside of the window.  Later that evening, he had agitation and restlessness. He received 25mg  Diphenhydramine at 1835 followed by 2mg  Haldol at 1841 and 1856, 3mg  Lorazepam at 1947.  He then became somnolent and later on had mild desaturations into the 80s on 3L [place].  His O2 was changed to NRB with improvement in sats to 100; however, mental status remained depressed and he was minimally responsive to noxious stimuli.  Per RN, prior to this he was awake and agitated, moving all extremities.  PCCM subsequently called and asked to see in consultation for concern of airway protection.  Per chart review, he carries no hx of seizures and he is not on any benzo's chronically.   Pertinent  Medical History:  has Complete tear of left rotator cuff; Essential hypertension; Impingement syndrome of left shoulder; Left hip pain; Left supraspinatus tenosynovitis; Pure hypercholesterolemia; Oral phase dysphagia; Esophageal dysphagia; Esophageal mass; AKI (acute kidney injury) ([place]); Hypernatremia; Cellulitis of left leg; Gastroesophageal reflux disease without esophagitis; Esophageal ulcer; Hypokalemia; AF (paroxysmal atrial fibrillation) ([place]); Cerebrovascular accident (CVA) ([place]); and Acute ischemic left MCA stroke ([place]) on their problem list.  Significant Hospital Events: Including procedures, antibiotic start and stop dates in addition to other pertinent events   12/5 Admit, to NIR for mechanical thrombectomy. Sedate  after benadryl, ativan, haldol.  Attempted benzo reversal with romazicon.   Interim History / Subjective:  Afebrile  On NRB, titrated to 6L [place] Pt denies pain, SOB  Objective:  Blood pressure (!) 140/91, pulse 97, temperature (!) 97.5 F (36.4 C), temperature source Axillary, resp. rate 18, SpO2 98 %.        Intake/Output Summary (Last 24 hours) at 06/20/2021 0819 Last data filed at 06/20/2021 0800 Gross per 24 hour  Intake 1851.33 ml  Output 1140 ml  Net 711.33 ml   There were no vitals filed for this visit.  Examination: General: elderly male lying in bed in NAD  HEENT: MM pink/moist, Carbon O2, anicteric Neuro: Awake, alert, oriented to self.  Reoriented to time, place, events. MAE to command. Occasional clear speech mixed with word salad.  CV: s1s2 RRR, no m/r/g PULM: non-labored at rest, lungs bilaterally clear  GI: soft, bsx4 active  Extremities: warm/dry, no edema, RLE in immobilization brace post thrombectomy.  R groin site without hematoma, dressing c/d/i Skin: no rashes or lesions  Labs/imaging personally reviewed:  CTA head 12/5 > distal L M2 occlusion, 65ml penumbra.  Mod stenosis R M1 MCA, mod to marked stenosis distal L P2 PCA. 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 -post procedure care per IR  -monitor site  -follow up MRI, ECHO  Acute Metabolic Encephalopathy  Iatrogenic 2/2 polypharmacy in elderly pt (received 25mg  Diphenhydramine at 1835 followed by 2mg  Haldol at 1841 and 1856, 3mg  Lorazepam at 1947).  He has no hx  of seizures and is not on benzos chronically. S/p romazicon for reversal.  -promote sleep / wake cycle  -avoid sedation medications -delirium prevention measures   Hx HTN, HLD, AF (not on AC). Tachycardia  -wean cleviprex for SBP goal 120-140 -hold home toprol XL, consider restart at half dose pm of 12/6 -tachy likely related to being off beta blocker  Rest per primary  team.  Best practice (evaluated daily):  Per Primary.  Critical care time: 31 minutes    Canary Brim, MSN, APRN, NP-C, AGACNP-BC  Pulmonary & Critical Care 06/20/2021, 8:20 AM   Please see [email or web address] for pager details.   From 7A-7P if no response, please call [phone] After hours, please call ELink [phone]

## 2021-06-20 NOTE — Progress Notes (Addendum)
STROKE TEAM PROGRESS NOTE   INTERVAL HISTORY Patient is seen in his room with no family at the bedside.  He was admitted yesterday with aphasia and right sided weakness and was found to have an acute left MCA M2 occlusion with a large mismatch on CT perfusion.  He was taken to IR for mechanical thrombectomy. TICI 3 flow was achieved. Overnight, he has required Cleviprex for BP control and had an episode of agitation requiring sedation with ativan. Patient is awake and interactive but appears quite delirious and confused.  And is in restraints.  Is able to move all 4 extremities without focal weakness.  Blood pressure adequately controlled Vitals:   06/20/21 1045 06/20/21 1100 06/20/21 1115 06/20/21 1200  BP: 119/90 (!) 149/74 (!) 150/88   Pulse: 100 97 100   Resp: 15 19 15    Temp:    98.6 F (37 C)  TempSrc:    Axillary  SpO2: 93% 94% 92%    CBC:  Recent Labs  Lab 06/19/21 1207 06/19/21 1710  WBC 6.7 9.3  NEUTROABS 5.1  --   HGB 12.7* 12.2*  HCT 39.1 37.0*  MCV 94.7 93.0  PLT 172 [id number]   Basic Metabolic Panel:  Recent Labs  Lab 06/19/21 1207 06/19/21 1710  NA 140  --   K 4.3  --   CL 109  --   CO2 28  --   GLUCOSE 113*  --   BUN 19  --   CREATININE 1.12 1.22  CALCIUM 8.6*  --    Lipid Panel:  Recent Labs  Lab 06/20/21 0359  CHOL 198  TRIG 44  HDL 79  CHOLHDL 2.5  VLDL 9  LDLCALC 110*   HgbA1c:  Recent Labs  Lab 06/20/21 0359  HGBA1C 5.3   Urine Drug Screen:  Recent Labs  Lab 06/19/21 1315  LABOPIA NONE DETECTED  COCAINSCRNUR NONE DETECTED  LABBENZ POSITIVE*  AMPHETMU NONE DETECTED  THCU NONE DETECTED  LABBARB NONE DETECTED    Alcohol Level  Recent Labs  Lab 06/19/21 1207  [place] <10    IMAGING past 24 hours IR [place]  Result Date: 06/20/2021 INDICATION: 87 year old male presents for cervical and cerebral angiogram secondary to left M2 occlusion and acute stroke. EXAM: ULTRASOUND-GUIDED ACCESS RIGHT COMMON FEMORAL ARTERY LEFT-SIDED  CERVICAL AND CEREBRAL ANGIOGRAM MECHANICAL THROMBECTOMY LEFT M2 ANGIO-SEAL FOR HEMOSTASIS. COMPARISON:  CT imaging same day MEDICATIONS: No intra arterial medications ANESTHESIA/SEDATION: The anesthesia team was present to provide general endotracheal tube anesthesia and for patient monitoring during the procedure. Intubation was performed in biplane/neurointerventional suite. Left radial arterial line was performed by the anesthesia team. Interventional neuro radiology nursing staff was also present. CONTRAST:  80 cc FLUOROSCOPY TIME:  Fluoroscopy Time: 9 minutes 30 seconds (675.8 mGy). COMPLICATIONS: None TECHNIQUE: Informed written consent was obtained from the patient's family after a thorough discussion of the procedural risks, benefits and alternatives. Specific risks discussed include: Bleeding, infection, contrast reaction, kidney injury/failure, need for further procedure/surgery, arterial injury or dissection, embolization to new territory, intracranial hemorrhage (10-15% risk), neurologic deterioration, cardiopulmonary collapse, death. All questions were addressed. Maximal Sterile Barrier Technique was utilized including during the procedure including caps, mask, sterile gowns, sterile gloves, sterile drape, hand hygiene and skin antiseptic. A timeout was performed prior to the initiation of the procedure. The anesthesia team was present to provide general endotracheal tube anesthesia and for patient monitoring during the procedure. Interventional neuro radiology nursing staff was also present. FINDINGS: Initial Findings: Left common  carotid artery: Common carotid artery with significant tortuosity after the origin from the aortic arch. Independent origin from the aortic arch. No significant atherosclerotic changes. Left external carotid artery: Patent with antegrade flow. Left internal carotid artery: Mild tortuosity of the cervical ICA segment. Moderate atherosclerosis at the origin of the ICA with less  than 50% stenosis at the origin and minimal irregularity. Vertical and petrous segment patent with normal course caliber contour. Cavernous segment patent. Clinoid segment patent. Antegrade flow of the ophthalmic artery. Ophthalmic segment patent. Terminus patent. Left MCA: M1 segment patent. Insular and opercular segments patent. The inferior division is occluded just beyond the origin, with TICI 0: No perfusion or antegrade flow beyond site of occlusion of the affected division. Fetal PCOM configuration on the left perfusing the posterior circulation. Left ACA: A1 segment is patent. Patent anterior communicating artery with some cross-filling into the right-sided territory. Completion Findings: Left MCA: TICI 3: Complete perfusion of the territory after restoring flow through the affected inferior M2 division Flat panel CT performed demonstrates no intracranial hemorrhage. PROCEDURE: The anesthesia team was present to provide general endotracheal tube anesthesia and for patient monitoring during the procedure. Intubation was performed in negative pressure [place] in neuro IR holding. Interventional neuro radiology nursing staff was also present. Ultrasound survey of the right inguinal region was performed with images stored and sent to PACs, confirming patency of the right common femoral artery. 11 blade scalpel was used to make a small incision. Blunt dissection was performed with US guidance. A micropuncture needle was used access the right common femoral artery under ultrasound. With excellent arterial blood flow returned, an .018 micro wire was passed through the needle, observed to enter the abdominal aorta under fluoroscopy. The needle was removed, and a micropuncture sheath was placed over the wire. The inner dilator and wire were removed, and an 035 wire was advanced under fluoroscopy into the abdominal aorta. Five [place] standard sheath was placed. Standard 5 French cavus catheter was advanced over the J  wire. Catheter was advanced to the proximal descending thoracic aorta and the wire was removed. Double flush was performed. Davis catheter was then used to select the left common carotid artery. A standard Glidewire was then advanced to the [place] catheter into the cervical ICA. Davis catheter was then advanced on the Glidewire into the distal cervical ICA. Glidewire was removed and contrast injected confirmed a luminal position. Exchange length roadrunner wire was then placed through the [place] catheter in the [place] catheter was removed. The 5 French sheath was removed and a 25cm 18F straight vascular sheath was placed. The dilator was removed and the sheath was flushed. Sheath was attached to pressurized and heparinized saline bag for constant forward flow. A coaxial system was then advanced over the roadrunner 035 wire. This included a 95cm 087 "[place]" balloon guide with coaxial 125cm Berenstein diagnostic catheter. This was advanced to the distal cervical ICA segment. Wire and catheter were then removed. Double flush of the catheter was performed. Angiogram was performed. Road map function was used once the occluded vessel was identified. Copious back flush was performed and the balloon catheter was attached to heparinized and pressurized saline bag for forward flow. We elected to use a direct first aspiration attempt for the first pass. A zoom 35 aspiration catheter was advanced with a coaxial synchro soft wire through the balloon guide. The aspiration catheter was advanced into the inferior division up to the site of occlusion. When the catheter was just proximal to  the site of occlusion, wire was removed and the aspiration catheter was attached to the proprietary aspiration vacuum system. This was turned on, aspiration of blood was confirmed, and then the catheter was advanced to the site of occlusion until resistance was encountered and cessation of blood flow was observed in the aspiration tubing. We observed  approximally 2 minutes of aspiration in this position. The balloon at the balloon guide catheter was then inflated under fluoroscopy for proximal flow arrest. Constant aspiration using the proprietary engine was then performed at the aspiration catheter, as the zoom 35 catheter was gently and slowly withdrawn with fluoroscopic observation. Once the catheter was removed from the balloon guide, free aspiration was confirmed at the hub of the balloon guide catheter, with free blood return confirmed. The balloon was then deflated, and a control angiogram was performed. Restoration of flow was confirmed. Angiogram of the cervical ICA was performed. Balloon guide was then completely removed. The skin at the puncture site was then cleaned with Chlorhexidine. The 8 French sheath was removed and an 31F angioseal was deployed. Flat panel CT was performed. Patient was extubated once the CT was reviewed. Patient tolerated the procedure well and remained hemodynamically stable throughout. No complications were encountered and no significant blood loss encountered. IMPRESSION: Status post ultrasound guided access of right common femoral artery for left-sided cervical/cerebral angiogram and mechanical thrombectomy of left M2 occlusion using direct aspiration technique with 1 pass and achieving TICI 3 flow. Angio-Seal for hemostasis. Signed, Dulcy Fanny. Dellia Nims, RPVI [facility] Radiology PLAN: ICU status Target systolic blood pressure of 120-140 Right hip straight time 4 hours Frequent neurovascular checks Repeat neurologic imaging with CT and/MRI at the discretion of neurology team Electronically Signed   By: Corrie Mckusick D.O.   On: 06/20/2021 08:19   IR US Guide Vasc Access Right  Result Date: 06/20/2021 INDICATION: 87 year old male presents for cervical and cerebral angiogram secondary to left M2 occlusion and acute stroke. EXAM: ULTRASOUND-GUIDED ACCESS RIGHT COMMON FEMORAL  ARTERY LEFT-SIDED CERVICAL AND CEREBRAL ANGIOGRAM MECHANICAL THROMBECTOMY LEFT M2 ANGIO-SEAL FOR HEMOSTASIS. COMPARISON:  CT imaging same day MEDICATIONS: No intra arterial medications ANESTHESIA/SEDATION: The anesthesia team was present to provide general endotracheal tube anesthesia and for patient monitoring during the procedure. Intubation was performed in biplane/neurointerventional suite. Left radial arterial line was performed by the anesthesia team. Interventional neuro radiology nursing staff was also present. CONTRAST:  80 cc FLUOROSCOPY TIME:  Fluoroscopy Time: 9 minutes 30 seconds (675.8 mGy). COMPLICATIONS: None TECHNIQUE: Informed written consent was obtained from the patient's family after a thorough discussion of the procedural risks, benefits and alternatives. Specific risks discussed include: Bleeding, infection, contrast reaction, kidney injury/failure, need for further procedure/surgery, arterial injury or dissection, embolization to new territory, intracranial hemorrhage (10-15% risk), neurologic deterioration, cardiopulmonary collapse, death. All questions were addressed. Maximal Sterile Barrier Technique was utilized including during the procedure including caps, mask, sterile gowns, sterile gloves, sterile drape, hand hygiene and skin antiseptic. A timeout was performed prior to the initiation of the procedure. The anesthesia team was present to provide general endotracheal tube anesthesia and for patient monitoring during the procedure. Interventional neuro radiology nursing staff was also present. FINDINGS: Initial Findings: Left common carotid artery: Common carotid artery with significant tortuosity after the origin from the aortic arch. Independent origin from the aortic arch. No significant atherosclerotic changes. Left external carotid artery: Patent with antegrade flow. Left internal carotid artery: Mild tortuosity of the cervical ICA segment. Moderate atherosclerosis  at the origin of  the ICA with less than 50% stenosis at the origin and minimal irregularity. Vertical and petrous segment patent with normal course caliber contour. Cavernous segment patent. Clinoid segment patent. Antegrade flow of the ophthalmic artery. Ophthalmic segment patent. Terminus patent. Left MCA: M1 segment patent. Insular and opercular segments patent. The inferior division is occluded just beyond the origin, with TICI 0: No perfusion or antegrade flow beyond site of occlusion of the affected division. Fetal PCOM configuration on the left perfusing the posterior circulation. Left ACA: A1 segment is patent. Patent anterior communicating artery with some cross-filling into the right-sided territory. Completion Findings: Left MCA: TICI 3: Complete perfusion of the territory after restoring flow through the affected inferior M2 division Flat panel CT performed demonstrates no intracranial hemorrhage. PROCEDURE: The anesthesia team was present to provide general endotracheal tube anesthesia and for patient monitoring during the procedure. Intubation was performed in negative pressure [place] in neuro IR holding. Interventional neuro radiology nursing staff was also present. Ultrasound survey of the right inguinal region was performed with images stored and sent to PACs, confirming patency of the right common femoral artery. 11 blade scalpel was used to make a small incision. Blunt dissection was performed with US guidance. A micropuncture needle was used access the right common femoral artery under ultrasound. With excellent arterial blood flow returned, an .018 micro wire was passed through the needle, observed to enter the abdominal aorta under fluoroscopy. The needle was removed, and a micropuncture sheath was placed over the wire. The inner dilator and wire were removed, and an 035 wire was advanced under fluoroscopy into the abdominal aorta. Five [place] standard sheath was placed. Standard 5 French cavus catheter was  advanced over the J wire. Catheter was advanced to the proximal descending thoracic aorta and the wire was removed. Double flush was performed. Davis catheter was then used to select the left common carotid artery. A standard Glidewire was then advanced to the [place] catheter into the cervical ICA. Davis catheter was then advanced on the Glidewire into the distal cervical ICA. Glidewire was removed and contrast injected confirmed a luminal position. Exchange length roadrunner wire was then placed through the [place] catheter in the [place] catheter was removed. The 5 French sheath was removed and a 25cm 57F straight vascular sheath was placed. The dilator was removed and the sheath was flushed. Sheath was attached to pressurized and heparinized saline bag for constant forward flow. A coaxial system was then advanced over the roadrunner 035 wire. This included a 95cm 087 "[place]" balloon guide with coaxial 125cm Berenstein diagnostic catheter. This was advanced to the distal cervical ICA segment. Wire and catheter were then removed. Double flush of the catheter was performed. Angiogram was performed. Road map function was used once the occluded vessel was identified. Copious back flush was performed and the balloon catheter was attached to heparinized and pressurized saline bag for forward flow. We elected to use a direct first aspiration attempt for the first pass. A zoom 35 aspiration catheter was advanced with a coaxial synchro soft wire through the balloon guide. The aspiration catheter was advanced into the inferior division up to the site of occlusion. When the catheter was just proximal to the site of occlusion, wire was removed and the aspiration catheter was attached to the proprietary aspiration vacuum system. This was turned on, aspiration of blood was confirmed, and then the catheter was advanced to the site of occlusion until resistance was encountered and cessation of  blood flow was observed in the aspiration  tubing. We observed approximally 2 minutes of aspiration in this position. The balloon at the balloon guide catheter was then inflated under fluoroscopy for proximal flow arrest. Constant aspiration using the proprietary engine was then performed at the aspiration catheter, as the zoom 35 catheter was gently and slowly withdrawn with fluoroscopic observation. Once the catheter was removed from the balloon guide, free aspiration was confirmed at the hub of the balloon guide catheter, with free blood return confirmed. The balloon was then deflated, and a control angiogram was performed. Restoration of flow was confirmed. Angiogram of the cervical ICA was performed. Balloon guide was then completely removed. The skin at the puncture site was then cleaned with Chlorhexidine. The 8 French sheath was removed and an 55F angioseal was deployed. Flat panel CT was performed. Patient was extubated once the CT was reviewed. Patient tolerated the procedure well and remained hemodynamically stable throughout. No complications were encountered and no significant blood loss encountered. IMPRESSION: Status post ultrasound guided access of right common femoral artery for left-sided cervical/cerebral angiogram and mechanical thrombectomy of left M2 occlusion using direct aspiration technique with 1 pass and achieving TICI 3 flow. Angio-Seal for hemostasis. Signed, Dulcy Fanny. Dellia Nims, RPVI [facility] Interventional Radiology Specialists [facility] Radiology PLAN: ICU status Target systolic blood pressure of 120-140 Right hip straight time 4 hours Frequent neurovascular checks Repeat neurologic imaging with CT and/MRI at the discretion of neurology team Electronically Signed   By: Corrie Mckusick D.O.   On: 06/20/2021 08:19   IR PERCUTANEOUS ART THROMBECTOMY/INFUSION INTRACRANIAL INC DIAG ANGIO  Result Date: 06/20/2021 INDICATION: 87 year old male presents for cervical and cerebral angiogram secondary to left M2 occlusion and acute  stroke. EXAM: ULTRASOUND-GUIDED ACCESS RIGHT COMMON FEMORAL ARTERY LEFT-SIDED CERVICAL AND CEREBRAL ANGIOGRAM MECHANICAL THROMBECTOMY LEFT M2 ANGIO-SEAL FOR HEMOSTASIS. COMPARISON:  CT imaging same day MEDICATIONS: No intra arterial medications ANESTHESIA/SEDATION: The anesthesia team was present to provide general endotracheal tube anesthesia and for patient monitoring during the procedure. Intubation was performed in biplane/neurointerventional suite. Left radial arterial line was performed by the anesthesia team. Interventional neuro radiology nursing staff was also present. CONTRAST:  80 cc FLUOROSCOPY TIME:  Fluoroscopy Time: 9 minutes 30 seconds (675.8 mGy). COMPLICATIONS: None TECHNIQUE: Informed written consent was obtained from the patient's family after a thorough discussion of the procedural risks, benefits and alternatives. Specific risks discussed include: Bleeding, infection, contrast reaction, kidney injury/failure, need for further procedure/surgery, arterial injury or dissection, embolization to new territory, intracranial hemorrhage (10-15% risk), neurologic deterioration, cardiopulmonary collapse, death. All questions were addressed. Maximal Sterile Barrier Technique was utilized including during the procedure including caps, mask, sterile gowns, sterile gloves, sterile drape, hand hygiene and skin antiseptic. A timeout was performed prior to the initiation of the procedure. The anesthesia team was present to provide general endotracheal tube anesthesia and for patient monitoring during the procedure. Interventional neuro radiology nursing staff was also present. FINDINGS: Initial Findings: Left common carotid artery: Common carotid artery with significant tortuosity after the origin from the aortic arch. Independent origin from the aortic arch. No significant atherosclerotic changes. Left external carotid artery: Patent with antegrade flow. Left internal carotid artery: Mild tortuosity of the  cervical ICA segment. Moderate atherosclerosis at the origin of the ICA with less than 50% stenosis at the origin and minimal irregularity. Vertical and petrous segment patent with normal course caliber contour. Cavernous segment patent. Clinoid segment patent. Antegrade flow of the ophthalmic artery. Ophthalmic segment patent. Terminus  patent. Left MCA: M1 segment patent. Insular and opercular segments patent. The inferior division is occluded just beyond the origin, with TICI 0: No perfusion or antegrade flow beyond site of occlusion of the affected division. Fetal PCOM configuration on the left perfusing the posterior circulation. Left ACA: A1 segment is patent. Patent anterior communicating artery with some cross-filling into the right-sided territory. Completion Findings: Left MCA: TICI 3: Complete perfusion of the territory after restoring flow through the affected inferior M2 division Flat panel CT performed demonstrates no intracranial hemorrhage. PROCEDURE: The anesthesia team was present to provide general endotracheal tube anesthesia and for patient monitoring during the procedure. Intubation was performed in negative pressure [place] in neuro IR holding. Interventional neuro radiology nursing staff was also present. Ultrasound survey of the right inguinal region was performed with images stored and sent to PACs, confirming patency of the right common femoral artery. 11 blade scalpel was used to make a small incision. Blunt dissection was performed with US guidance. A micropuncture needle was used access the right common femoral artery under ultrasound. With excellent arterial blood flow returned, an .018 micro wire was passed through the needle, observed to enter the abdominal aorta under fluoroscopy. The needle was removed, and a micropuncture sheath was placed over the wire. The inner dilator and wire were removed, and an 035 wire was advanced under fluoroscopy into the abdominal aorta. Five [place]  standard sheath was placed. Standard 5 French cavus catheter was advanced over the J wire. Catheter was advanced to the proximal descending thoracic aorta and the wire was removed. Double flush was performed. Davis catheter was then used to select the left common carotid artery. A standard Glidewire was then advanced to the [place] catheter into the cervical ICA. Davis catheter was then advanced on the Glidewire into the distal cervical ICA. Glidewire was removed and contrast injected confirmed a luminal position. Exchange length roadrunner wire was then placed through the [place] catheter in the [place] catheter was removed. The 5 French sheath was removed and a 25cm 63F straight vascular sheath was placed. The dilator was removed and the sheath was flushed. Sheath was attached to pressurized and heparinized saline bag for constant forward flow. A coaxial system was then advanced over the roadrunner 035 wire. This included a 95cm 087 "[place]" balloon guide with coaxial 125cm Berenstein diagnostic catheter. This was advanced to the distal cervical ICA segment. Wire and catheter were then removed. Double flush of the catheter was performed. Angiogram was performed. Road map function was used once the occluded vessel was identified. Copious back flush was performed and the balloon catheter was attached to heparinized and pressurized saline bag for forward flow. We elected to use a direct first aspiration attempt for the first pass. A zoom 35 aspiration catheter was advanced with a coaxial synchro soft wire through the balloon guide. The aspiration catheter was advanced into the inferior division up to the site of occlusion. When the catheter was just proximal to the site of occlusion, wire was removed and the aspiration catheter was attached to the proprietary aspiration vacuum system. This was turned on, aspiration of blood was confirmed, and then the catheter was advanced to the site of occlusion until resistance was  encountered and cessation of blood flow was observed in the aspiration tubing. We observed approximally 2 minutes of aspiration in this position. The balloon at the balloon guide catheter was then inflated under fluoroscopy for proximal flow arrest. Constant aspiration using the proprietary engine was then performed  at the aspiration catheter, as the zoom 35 catheter was gently and slowly withdrawn with fluoroscopic observation. Once the catheter was removed from the balloon guide, free aspiration was confirmed at the hub of the balloon guide catheter, with free blood return confirmed. The balloon was then deflated, and a control angiogram was performed. Restoration of flow was confirmed. Angiogram of the cervical ICA was performed. Balloon guide was then completely removed. The skin at the puncture site was then cleaned with Chlorhexidine. The 8 French sheath was removed and an 72F angioseal was deployed. Flat panel CT was performed. Patient was extubated once the CT was reviewed. Patient tolerated the procedure well and remained hemodynamically stable throughout. No complications were encountered and no significant blood loss encountered. IMPRESSION: Status post ultrasound guided access of right common femoral artery for left-sided cervical/cerebral angiogram and mechanical thrombectomy of left M2 occlusion using direct aspiration technique with 1 pass and achieving TICI 3 flow. Angio-Seal for hemostasis. Signed, Dulcy Fanny. Dellia Nims, RPVI [facility] Radiology PLAN: ICU status Target systolic blood pressure of 120-140 Right hip straight time 4 hours Frequent neurovascular checks Repeat neurologic imaging with CT and/MRI at the discretion of neurology team Electronically Signed   By: Corrie Mckusick D.O.   On: 06/20/2021 08:19   CT ANGIO HEAD NECK W WO CM W PERF (CODE STROKE)  Result Date: 06/19/2021 CLINICAL DATA:  Neuro deficit, acute, stroke suspected EXAM: CT  ANGIOGRAPHY HEAD AND NECK CT PERFUSION BRAIN TECHNIQUE: Multidetector CT imaging of the head and neck was performed using the standard protocol during bolus administration of intravenous contrast. Multiplanar CT image reconstructions and MIPs were obtained to evaluate the vascular anatomy. Carotid stenosis measurements (when applicable) are obtained utilizing NASCET criteria, using the distal internal carotid diameter as the denominator. Multiphase CT imaging of the brain was performed following IV bolus contrast injection. Subsequent parametric perfusion maps were calculated using RAPID software. CONTRAST:  119mL OMNIPAQUE IOHEXOL 350 MG/ML SOLN COMPARISON:  None. FINDINGS: CTA NECK Aortic arch: Mild mixed plaque. Great vessel origins are patent without high-grade stenosis. Right carotid system: Patent. Eccentric noncalcified plaque along the common carotid with minimal stenosis. Mixed but primarily noncalcified plaque at the proximal ICA causing less than 50% stenosis. Left carotid system: Patent. Noncalcified plaque along the common carotid with minimal stenosis. Mixed but primarily calcified plaque along the proximal internal carotid causing less than 50% stenosis. Vertebral arteries: Patent dominant left vertebral artery. Right vertebral artery is diminutive in caliber but patent to the skull base. Skeleton: Advanced degenerative changes of the cervical spine. Other neck: Unremarkable. Upper chest: Emphysema. Review of the MIP images confirms the above findings CTA HEAD Anterior circulation: Intracranial internal carotid arteries are patent with calcified plaque causing mild stenosis. Left M1 MCA is patent. There is occlusion of distal left M2 posterior division within the posterior left sylvian fissure. Right middle and both anterior cerebral arteries are patent. There is focal moderate stenosis of the right M1 MCA. Posterior circulation: Intracranial left vertebral artery is patent with mild calcified plaque.  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 Basilar artery is patent. Major cerebellar artery origins are patent. A left posterior communicating artery is present. Posterior cerebral arteries are patent. There is focal moderate to marked stenosis of the distal left P2 PCA. Venous sinuses: Patent as allowed by contrast bolus timing. Review of the MIP images confirms the above findings CT Brain Perfusion Findings: CBF (<30%) Volume: 52mL Perfusion (Tmax>6.0s) volume: 102mL  Mismatch Volume: 37mL Infarction Location: Left MCA territory. There is also some involvement of the right frontal periventricular white matter and temporal lobe which is likely artifactual. IMPRESSION: Distal left M2 MCA occlusion within the posterior left sylvian fissure. Perfusion imaging demonstrates no evidence of core infarction. 90 mL of penumbra in the left MCA territory is likely mildly overestimated given some artifactual contralateral contribution. Plaque at the ICA origins causes less than 50% stenosis. Diminutive but patent extracranial right vertebral artery probably on a congenital basis. Apparent occlusion intracranially is probably not acute. The dominant left vertebral artery is patent. Moderate stenosis right M1 MCA. Moderate to marked stenosis distal left P2 PCA. Preliminary results were communicated to Dr. Quinn Axe at 12:50 pm on 06/19/2021 by text page via the [facility] messaging system. Electronically Signed   By: Macy Mis M.D.   On: 06/19/2021 13:06    PHYSICAL EXAM General:  Patient is a confused, alert male in no acute distress. . Afebrile. Head is nontraumatic. Neck is supple without bruit.    Cardiac exam no murmur or gallop. Lungs are clear to auscultation. Distal pulses are well felt.   Neurological Exam :   Patient is oriented to name only.  He will respond to questions, but answers are not always appropriate.  He is confused and delirious.  Diminished attention, registration and recall.  Follows  simple midline and one-step commands only.  He is able to move all extremities purposefully in response to commands. EOMI, no facial droop.  No focal weakness.  ASSESSMENT/PLAN [name]. is a 87 y.o. male with history of HTN, atrial fibrillation not on anticoagulation and mild dementia presenting with aphasia and right sided weakness. He was found to have an acute left MCA M2 occlusion with a large mismatch on CT perfusion.  He was taken to IR for mechanical thrombectomy. TICI 3 flow was achieved. Overnight, he has required Cleviprex for BP control and had an episode of agitation requiring sedation with ativan.  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  Patient has delirium likely due to sundowning from mild cognitive impairment/dementia at baseline Code Stroke CT head No acute abnormality. Chronic microvascular disease. ASPECTS 10.    CTA head & neck Less than 50% stenosis of right and left carotid arteries, distal left M2 MCA occlusion CT perfusion 90 mL of penumbra in left MCA territory Cerebral angio s/p mechanical thrombectomy of left M2 MCA Post IR CT no ICH/SAH MRI  pending 2D Echo pending LDL 110 HgbA1c 5.3 VTE prophylaxis - lovenox    Diet   Diet NPO time specified   No antithrombotic prior to admission, now on aspirin 81 mg daily.  Therapy recommendations:  pending Disposition:  pending  Hypertension Home meds:  metoprolol 25 mg daily Unstable Requiring Cleviprex Kepp SBP 120-140 post thrombectomy Long-term BP goal normotensive  Hyperlipidemia Home meds:  none LDL 110, goal < 70 Add Atorvastatin 40 mg  High intensity statin initiated Continue statin at discharge  Atrial fibrillation Will likely need anticoagulation with a DOAC  Other Stroke Risk Factors Advanced Age >/= 29   Other Active  Problems Delirium/mild dementia Reorient PRN Delirium precautions  Hospital day # [place] , MSN, AGACNP-BC [facility] See Amion for schedule and pager information 06/20/2021 12:47 PM   STROKE MD NOTE : I have personally obtained history,examined  this patient, reviewed notes, independently viewed imaging studies, participated in medical decision making and plan of care.ROS completed by me personally and pertinent positives fully documented  I have made any additions or clarifications directly to the above note. Agree with note above.  Patient presented with aphasia right hemiparesis getting left M2 occlusion and was treated with successful mechanical thrombectomy was doing well but has developed delirium likely due to sundowning is mild dementia/cognitive impairment.  Recommend strict blood pressure control and close neurological monitoring as per post intervention protocol.  Speech therapy for swallow eval.  Resume home medications.  May start scheduled Seroquel for agitation and limit benzodiazepines.  Mobilize out of bed.  Check MRI if patient is cooperative otherwise change to CT scan of the head.  Start aspirin after his brain scan.  May need to consider switching to Eliquis later depending upon size of stroke.  No family available for discussion.  Discussed with Dr. Lynetta Mare critical care medicine.This patient is critically ill and at significant risk of neurological worsening, death and care requires constant monitoring of vital signs, hemodynamics,respiratory and cardiac monitoring, extensive review of multiple databases, frequent neurological assessment, discussion with family, other specialists and medical decision making of high complexity.I have made any additions or clarifications directly to the above note.This critical care time does not reflect procedure time, or teaching time or supervisory time of PA/NP/Med Resident etc but could involve care discussion  time.  I spent 30 minutes of neurocritical care time  in the care of  this patient.      Antony Contras, MD Medical Director [place] Pager: [phone] 06/20/2021 2:03 PM   To contact Stroke Continuity provider, please refer to [email or web address] After hours, contact General Neurology

## 2021-06-20 NOTE — Progress Notes (Signed)
S: Assessed at bedside for agitated delirium.    O: Blood pressure 104/67, pulse (!) 107, temperature 98.6 F (37 C), temperature source Axillary, resp. rate (!) 24, SpO2 (!) 86 %.   Patient confused, agitated. Face flushed.  Pulling at lines.  Spent approximately 40 minutes with patient.  Talking with him and trying to reorient at bedside.  Patient pulled IV's out.  Down to one IV with cleviprex infusing in site.    Attempted IV in R forearm but unable to thread.  Pressure held at site, dressing applied.  #24 g IV started in posterior left forearem x1 attempt. Flushes easily. Sterile technique utilized, dressing applied.  Patient calmed at end of encounter.    A: Agitated Delirium superimposed on L M2 CVA   P: -IV established as above  -begin low dose precedex infusion  -follow clinical exam    Additional CC Time: 30 minutes    Canary Brim, MSN, APRN, NP-C, AGACNP-BC [facility] 06/20/2021, 4:03 PM   Please see [email or web address] for pager details.   From 7A-7P if no response, please call [phone] After hours, please call ELink [phone]

## 2021-06-20 NOTE — Progress Notes (Addendum)
Around 1430, patient became agitated and combative. Contacted Stroke Team to ask for PRN seroquel that had been discussed during rounds as patient passed modified barium. While attempting to administer medication, patient became more combative kicking and swinging arms at nursing staff. Staff placed patient in ankle restraints and contacted Stroke team who came to assess patient. Stroke team requested assistance from CCM for precedex gtt to avoid benzos for sedation. Brandi NP for CCM assessed patient and was able to start a new IV for use in administration of precedex. At this time, patient is still unwilling to take PO meds.  During this event, EEG called to determine if patient was appropriate for EEG. This nurse recommended they wait until tomorrow to complete study due to patient agitation. Patient's blood pressure continued to increase as his agitation worsened. Cleviprex titrated throughout event. Will continue to monitor.

## 2021-06-21 ENCOUNTER — Inpatient Hospital Stay (HOSPITAL_COMMUNITY): Payer: Medicare Other

## 2021-06-21 DIAGNOSIS — I10 Essential (primary) hypertension: Secondary | ICD-10-CM | POA: Diagnosis not present

## 2021-06-21 DIAGNOSIS — I63512 Cerebral infarction due to unspecified occlusion or stenosis of left middle cerebral artery: Secondary | ICD-10-CM | POA: Diagnosis not present

## 2021-06-21 DIAGNOSIS — Z515 Encounter for palliative care: Secondary | ICD-10-CM | POA: Diagnosis not present

## 2021-06-21 DIAGNOSIS — J9601 Acute respiratory failure with hypoxia: Secondary | ICD-10-CM

## 2021-06-21 DIAGNOSIS — Z7189 Other specified counseling: Secondary | ICD-10-CM

## 2021-06-21 DIAGNOSIS — G9341 Metabolic encephalopathy: Secondary | ICD-10-CM

## 2021-06-21 LAB — BASIC METABOLIC PANEL
Anion gap: 9 (ref 5–15)
BUN: 20 mg/dL (ref 8–23)
CO2: 21 mmol/L — ABNORMAL LOW (ref 22–32)
Calcium: 7.7 mg/dL — ABNORMAL LOW (ref 8.9–10.3)
Chloride: 112 mmol/L — ABNORMAL HIGH (ref 98–111)
Creatinine, Ser: 1.2 mg/dL (ref 0.61–1.24)
GFR, Estimated: 57 mL/min — ABNORMAL LOW (ref >60)
Glucose, Bld: 106 mg/dL — ABNORMAL HIGH (ref 70–99)
Potassium: 4 mmol/L (ref 3.5–5.1)
Sodium: 142 mmol/L (ref 135–145)

## 2021-06-21 LAB — CBC
HCT: 35.3 % — ABNORMAL LOW (ref 39.0–52.0)
Hemoglobin: 11.2 g/dL — ABNORMAL LOW (ref 13.0–17.0)
MCH: 29.9 pg (ref 26.0–34.0)
MCHC: 31.7 g/dL (ref 30.0–36.0)
MCV: 94.4 fL (ref 80.0–100.0)
Platelets: 161 10*3/uL (ref 150–400)
RBC: 3.74 MIL/uL — ABNORMAL LOW (ref 4.22–5.81)
RDW: 15.6 % — ABNORMAL HIGH (ref 11.5–15.5)
WBC: 11.1 10*3/uL — ABNORMAL HIGH (ref 4.0–10.5)
nRBC: 0 % (ref 0.0–0.2)

## 2021-06-21 MED ORDER — QUETIAPINE FUMARATE 25 MG PO TABS
12.5000 mg | ORAL_TABLET | Freq: Every day | ORAL | Status: DC
  Administered 2021-06-21: 12.5 mg via ORAL
  Filled 2021-06-21: qty 1

## 2021-06-21 MED ORDER — FUROSEMIDE 10 MG/ML IJ SOLN
40.0000 mg | Freq: Once | INTRAMUSCULAR | Status: AC
  Administered 2021-06-21: 40 mg via INTRAVENOUS
  Filled 2021-06-21: qty 4

## 2021-06-21 MED ORDER — MAGNESIUM SULFATE 2 GM/50ML IV SOLN
2.0000 g | Freq: Once | INTRAVENOUS | Status: AC
  Administered 2021-06-21: 2 g via INTRAVENOUS
  Filled 2021-06-21: qty 50

## 2021-06-21 MED ORDER — HEPARIN (PORCINE) 25000 UT/250ML-% IV SOLN
1000.0000 [IU]/h | INTRAVENOUS | Status: DC
  Administered 2021-06-21: 850 [IU]/h via INTRAVENOUS
  Filled 2021-06-21: qty 250

## 2021-06-21 MED ORDER — METOPROLOL SUCCINATE ER 25 MG PO TB24
25.0000 mg | ORAL_TABLET | Freq: Every day | ORAL | Status: DC
  Administered 2021-06-21: 25 mg via ORAL
  Filled 2021-06-21: qty 1

## 2021-06-21 MED ORDER — QUETIAPINE FUMARATE 50 MG PO TABS
25.0000 mg | ORAL_TABLET | Freq: Every day | ORAL | Status: DC
  Administered 2021-06-22 – 2021-06-28 (×7): 25 mg via ORAL
  Filled 2021-06-21 (×8): qty 1

## 2021-06-21 MED ORDER — MEMANTINE HCL 5 MG PO TABS
5.0000 mg | ORAL_TABLET | Freq: Every morning | ORAL | Status: DC
  Administered 2021-06-23 – 2021-06-29 (×7): 5 mg via ORAL
  Filled 2021-06-21 (×8): qty 1

## 2021-06-21 MED ORDER — LABETALOL HCL 5 MG/ML IV SOLN
10.0000 mg | INTRAVENOUS | Status: DC | PRN
  Administered 2021-06-21 – 2021-06-22 (×5): 10 mg via INTRAVENOUS
  Filled 2021-06-21 (×5): qty 4

## 2021-06-21 MED ORDER — ALBUTEROL SULFATE (2.5 MG/3ML) 0.083% IN NEBU: 2.5000 mg | INHALATION_SOLUTION | RESPIRATORY_TRACT | Status: DC | PRN

## 2021-06-21 MED ORDER — LABETALOL HCL 5 MG/ML IV SOLN: 10.0000 mg | INTRAVENOUS | Status: DC | PRN

## 2021-06-21 MED ORDER — IOHEXOL 350 MG/ML SOLN
75.0000 mL | Freq: Once | INTRAVENOUS | Status: AC | PRN
  Administered 2021-06-21: 75 mL via INTRAVENOUS

## 2021-06-21 MED ORDER — CLEVIDIPINE BUTYRATE 0.5 MG/ML IV EMUL
0.0000 mg/h | INTRAVENOUS | Status: DC
  Administered 2021-06-21: 10 mg/h via INTRAVENOUS
  Administered 2021-06-21: 7 mg/h via INTRAVENOUS
  Administered 2021-06-22: 12 mg/h via INTRAVENOUS
  Administered 2021-06-22: 10 mg/h via INTRAVENOUS
  Administered 2021-06-22: 8 mg/h via INTRAVENOUS
  Administered 2021-06-22: 12 mg/h via INTRAVENOUS
  Administered 2021-06-22: 11 mg/h via INTRAVENOUS
  Filled 2021-06-21 (×8): qty 50

## 2021-06-21 NOTE — Progress Notes (Signed)
SLP Cancellation Note  Patient Details Name: Devon Elliott. MRN: [id number] DOB: 1927-04-20   Cancelled treatment:        Attempted to see pt for speech evaluation.  Pt is preparing for CT scan has been agitated this morning.  RN recommends holding evaluation at this time.  Per informal observation yesterday, pt with fluent aphasia lacking in information.  RN reports same continues. No difficulty with POs per RN observation.  SLP will reattempt as schedule permits when pt is appropriate for assessment.   Lyda Colcord E Mckynna Vanloan 06/21/2021, 10:04 AM

## 2021-06-21 NOTE — Consult Note (Signed)
Consultation Note Date: 06/21/2021   Patient Name: Devon Elliott.  DOB: 09-21-1926  MRN: [id number]  Age / Sex: 87 y.o., male  PCP: Rusty Aus, MD Referring Physician: Stroke, Md, MD  Reason for Consultation: Establishing goals of care  HPI/Patient Profile: 87 y.o. male  with past medical history of htn, hld, dysphagia, AKI, GERD, and a fib admitted on 06/19/2021 with CVA. Had mechanical thrombectomy. Since that time has had agitation and restlessness.  PMT consulted to discuss [place].   Clinical Assessment and Goals of Care: I have reviewed medical records including EPIC notes, labs and imaging, received report from RN, assessed the patient and then met with patient's son Shanon Brow  to discuss diagnosis prognosis, [place], EOL wishes, disposition and options.  I introduced Palliative Medicine as specialized medical care for people living with serious illness. It focuses on providing relief from the symptoms and stress of a serious illness. The goal is to improve quality of life for both the patient and the family.  As far as functional and nutritional status, Shanon Brow shares patient was able to care for himself independently. He used a walker at home. No concerns about appetite. Had mild dementia.    We discussed patient's current illness and what it means in the larger context of patient's on-going co-morbidities.  Natural disease trajectory and expectations at EOL were discussed. Shanon Brow feels that patient's mental status is improving - better today than yesterday.  I attempted to elicit values and goals of care important to the patient.    The difference between aggressive medical intervention and comfort care was considered in light of the patient's goals of care.   Shanon Brow feels that the patient would be interested in full code/full scope care for now. He does share the patient would not want long term ventilation but okay with short term.    Discussed with patient/family the importance of continued conversation with family and the medical providers regarding overall plan of care and treatment options, ensuring decisions are within the context of the patient's values and GOCs.    Questions and concerns were addressed. The family was encouraged to call with questions or concerns.   Primary Decision Maker NEXT OF KIN - spouse June    SUMMARY OF RECOMMENDATIONS   - continue full code/full scope care for now -- family hopeful for improvement, allow time for outcomes - patient would not want long tern mechanical ventilation - okay with short term per son  Code Status/Advance Care Planning: Full code  Discharge Planning: To Be Determined      Primary Diagnoses: Present on Admission:  Acute ischemic left MCA stroke ([place])   I have reviewed the medical record, interviewed the patient and family, and examined the patient. The following aspects are pertinent.  Past Medical History:  Diagnosis Date   Complete tear of left rotator cuff 07/21/2014   Essential hypertension 04/19/2014   Impingement syndrome of left shoulder 07/21/2014   Left hip pain 02/08/2016   Left supraspinatus tenosynovitis 07/21/2014   Pure hypercholesterolemia 04/19/2014   Social History   Socioeconomic History   Marital status: Married    Spouse name: Not on file   Number of children: Not on file   Years of education: Not on file   Highest education level: Not on file  Occupational History   Not on file  Tobacco Use   Smoking status: Former   Smokeless tobacco: Never  Substance and Sexual Activity   Alcohol use: Yes  Comment: social   Drug use: No   Sexual activity: Not on file  Other Topics Concern   Not on file  Social History Narrative   Not on file   Social Determinants of Health   Financial Resource Strain: Not on file  Food Insecurity: Not on file  Transportation Needs: Not on file  Physical Activity: Not on file  Stress: Not on  file  Social Connections: Not on file   Family History  Problem Relation Age of Onset   Pancreatic cancer Mother    Diabetes Father    Scheduled Meds:  aspirin  81 mg Oral Daily   atorvastatin  40 mg Oral Daily   Chlorhexidine Gluconate Cloth  6 each Topical Daily   enoxaparin (LOVENOX) injection  40 mg Subcutaneous Q24H   QUEtiapine  12.5 mg Oral Q0600   QUEtiapine  25 mg Oral QHS   Continuous Infusions:  sodium chloride 25 mL/hr (06/21/21 0951)   clevidipine 2 mg/hr (06/21/21 1055)   dexmedetomidine (PRECEDEX) IV infusion 0.6 mcg/kg/hr (06/21/21 0950)   PRN Meds:.acetaminophen **OR** acetaminophen (TYLENOL) oral liquid 160 mg/5 mL **OR** acetaminophen, albuterol, labetalol, QUEtiapine, senna-docusate Allergies  Allergen Reactions   Ace Inhibitors     Other reaction(s): Kidney Disorder   Clarithromycin Hives   Etodolac     Other reaction(s): Kidney Disorder   Levofloxacin Swelling   Review of Systems  Unable to perform ROS: Mental status change   Physical Exam Constitutional:      Comments: Agitated, in 5 point restraints, on precedex drip, nonrebreather in place  Skin:    General: Skin is warm and dry.  Neurological:     Mental Status: He is alert.    Vital Signs: BP (!) 108/55   Pulse (!) 117   Temp 97.9 F (36.6 C) (Axillary)   Resp (!) 25   SpO2 97%  Pain Scale: PAINAD   Pain Score: 0-No pain   SpO2: SpO2: 97 % O2 Device:SpO2: 97 % O2 Flow Rate: .O2 Flow Rate (L/min): 15 L/min  IO: Intake/output summary:  Intake/Output Summary (Last 24 hours) at 06/21/2021 1404 Last data filed at 06/21/2021 0600 Gross per 24 hour  Intake 1266.03 ml  Output 900 ml  Net 366.03 ml    LBM: Last BM Date:  (PTA) Baseline Weight:   Most recent weight:       Palliative Assessment/Data: PPS 30%    Time Total: 55 minutes Greater than 50%  of this time was spent counseling and coordinating care related to the above assessment and plan.  Juel Burrow, DNP,  AGNP-C Palliative Medicine Team [phone] Pager: [phone]

## 2021-06-21 NOTE — Progress Notes (Signed)
Referring Physician(s): Dr Royal Hawthorn  Supervising Physician: Corrie Mckusick  Patient Status:  [name] - In-pt  Chief Complaint:  CVA  Subjective:  L MCA revascularization  12/5 in IR- Dr Earleen Newport CTA Head just done Just back to room Less agitated today--- but still in restrains Son at bedside  Allergies: Ace inhibitors, Clarithromycin, Etodolac, and Levofloxacin  Medications: Prior to Admission medications   Medication Sig Start Date End Date Taking? Authorizing Provider  acetaminophen (TYLENOL) 500 MG tablet Take 1,000 mg by mouth every 8 (eight) hours as needed for mild pain.   Yes [provider]  aspirin 81 MG EC tablet Take 81 tablets by mouth 2 (two) times a week. 11/02/14  Yes [provider]  Cholecalciferol 25 MCG (1000 UT) capsule Take 1,000 Units by mouth daily.   Yes [provider]  memantine (NAMENDA) 5 MG tablet Take 5 mg by mouth every evening. 06/06/21  Yes [provider]  traMADol (ULTRAM) 50 MG tablet Take 50 mg by mouth 2 (two) times daily as needed for moderate pain. 04/06/20  Yes [provider]  metoprolol succinate (TOPROL-XL) 25 MG 24 hr tablet Take 1 tablet (25 mg total) by mouth daily. 05/01/20   Loletha Grayer, MD  pantoprazole (PROTONIX) 40 MG tablet Take 1 tablet (40 mg total) by mouth daily. Patient not taking: Reported on 06/20/2021 05/01/20   Loletha Grayer, MD     Vital Signs: BP (!) 108/55   Pulse 98   Temp 97.9 F (36.6 C) (Axillary)   Resp 17   SpO2 98%   Physical Exam Constitutional:      Comments: Speech is recognizable Forms words and does seem to be able to communicate to most questions Does not follow commands  Asking to "get these mittens off" "Wants out of here"   Musculoskeletal:     Comments: Moves all 4s Not to command In restraints Mittens on    Skin:    General: Skin is warm.     Comments: Rt groin NT no bleeding No hematoma Rt foot 1+ pulses     Imaging: CT ANGIO HEAD W OR WO CONTRAST  Result Date: 06/21/2021 CLINICAL DATA:  Stroke follow-up. Recent thrombectomy for left M2 occlusion. EXAM: CT ANGIOGRAPHY HEAD TECHNIQUE: Multidetector CT imaging of the head was performed using the standard protocol during bolus administration of intravenous contrast. Multiplanar CT image reconstructions and MIPs were obtained to evaluate the vascular anatomy. CONTRAST:  66mL OMNIPAQUE IOHEXOL 350 MG/ML SOLN COMPARISON:  Head CT and CTA 06/19/2021 FINDINGS: CT HEAD Brain: No definite acute cortically based infarct, intracranial hemorrhage, mass, midline shift, or extra-axial fluid collection is identified. Confluent hypodensities in the cerebral white matter bilaterally are unchanged and nonspecific but compatible with extensive chronic small vessel ischemic disease. There is mild cerebral atrophy. Vascular: Calcified atherosclerosis at the skull base. Skull: No fracture or suspicious osseous lesion. Sinuses: Paranasal sinuses and mastoid air cells are clear. Other: Bilateral cataract extraction. CTA HEAD Anterior circulation: The included internal carotid arteries are patent with unchanged mild stenosis of the distal left cervical ICA and no significant intracranial ICA stenosis. ACAs and MCAs are patent without evidence of a proximal branch occlusion. The revascularized left M2 vessel remains patent. There unchanged moderate right M1, moderate proximal left M2, and severe proximal left A1 stenoses. No aneurysm is identified. Posterior circulation: The intracranial left vertebral artery is widely patent and supplies the basilar. 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 Patent left PICA, bilateral AICA, and bilateral SCA origins are identified. The basilar artery is widely patent. There is a large left posterior communicating artery. Both PCAs are patent with unchanged mild right P1 narrowing and moderate to severe stenoses of  the distal left P2 segment and right P2 bifurcation. No aneurysm is identified. Venous sinuses: As permitted by contrast timing, patent. Anatomic variants: None. Review of the MIP images confirms the above findings. IMPRESSION: 1. No evidence of acute intracranial abnormality. No new intracranial large vessel occlusion. 2. Persistent patency of the revascularized left M2 vessel. 3. 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 4. Unchanged intracranial atherosclerosis with moderate to severe anterior and posterior circulation stenoses as detailed above. Electronically Signed   By: Sebastian Ache M.D.   On: 06/21/2021 12:24   IR CT Head Ltd  Result Date: 06/20/2021 INDICATION: 87 year old male presents for cervical and cerebral angiogram secondary to left M2 occlusion and acute stroke. EXAM: ULTRASOUND-GUIDED ACCESS RIGHT COMMON FEMORAL ARTERY LEFT-SIDED CERVICAL AND CEREBRAL ANGIOGRAM MECHANICAL THROMBECTOMY LEFT M2 ANGIO-SEAL FOR HEMOSTASIS. COMPARISON:  CT imaging same day MEDICATIONS: No intra arterial medications ANESTHESIA/SEDATION: The anesthesia team was present to provide general endotracheal tube anesthesia and for patient monitoring during the procedure. Intubation was performed in biplane/neurointerventional suite. Left radial arterial line was performed by the anesthesia team. Interventional neuro radiology nursing staff was also present. CONTRAST:  80 cc FLUOROSCOPY TIME:  Fluoroscopy Time: 9 minutes 30 seconds (675.8 mGy). COMPLICATIONS: None TECHNIQUE: Informed written consent was obtained from the patient's family after a thorough discussion of the procedural risks, benefits and alternatives. Specific risks discussed include: Bleeding, infection, contrast reaction, kidney injury/failure, need for further procedure/surgery, arterial injury or dissection, embolization to new territory, intracranial hemorrhage (10-15% risk), neurologic deterioration, cardiopulmonary collapse,  death. All questions were addressed. Maximal Sterile Barrier Technique was utilized including during the procedure including caps, mask, sterile gowns, sterile gloves, sterile drape, hand hygiene and skin antiseptic. A timeout was performed prior to the initiation of the procedure. The anesthesia team was present to provide general endotracheal tube anesthesia and for patient monitoring during the procedure. Interventional neuro radiology nursing staff was also present. FINDINGS: Initial Findings: Left common carotid artery: Common carotid artery with significant tortuosity after the origin from the aortic arch. Independent origin from the aortic arch. No significant atherosclerotic changes. Left external carotid artery: Patent with antegrade flow. Left internal carotid artery: Mild tortuosity of the cervical ICA segment. Moderate atherosclerosis at the origin of the ICA with less than 50% stenosis at the origin and minimal irregularity. Vertical and petrous segment patent with normal course caliber contour. Cavernous segment patent. Clinoid segment patent. Antegrade flow of the ophthalmic artery. Ophthalmic segment patent. Terminus patent. Left MCA: M1 segment patent. Insular and opercular segments patent. The inferior division is occluded just beyond the origin, with TICI 0: No perfusion or antegrade flow beyond site of occlusion of the affected division. Fetal PCOM configuration on the left perfusing the posterior circulation. Left ACA: A1 segment is patent. Patent anterior communicating artery with some cross-filling into the right-sided territory. Completion Findings: Left MCA: TICI 3: Complete perfusion of the territory after restoring flow through the affected inferior M2 division Flat panel CT performed demonstrates no intracranial hemorrhage. PROCEDURE: The anesthesia team was present to provide general endotracheal tube anesthesia and for patient monitoring during the procedure. Intubation was performed in  negative pressure [place] in neuro IR holding. Interventional neuro radiology nursing staff was also present. Ultrasound survey  of the right inguinal region was performed with images stored and sent to PACs, confirming patency of the right common femoral artery. 11 blade scalpel was used to make a small incision. Blunt dissection was performed with US guidance. A micropuncture needle was used access the right common femoral artery under ultrasound. With excellent arterial blood flow returned, an .018 micro wire was passed through the needle, observed to enter the abdominal aorta under fluoroscopy. The needle was removed, and a micropuncture sheath was placed over the wire. The inner dilator and wire were removed, and an 035 wire was advanced under fluoroscopy into the abdominal aorta. Five [place] standard sheath was placed. Standard 5 French cavus catheter was advanced over the J wire. Catheter was advanced to the proximal descending thoracic aorta and the wire was removed. Double flush was performed. Davis catheter was then used to select the left common carotid artery. A standard Glidewire was then advanced to the [place] catheter into the cervical ICA. Davis catheter was then advanced on the Glidewire into the distal cervical ICA. Glidewire was removed and contrast injected confirmed a luminal position. Exchange length roadrunner wire was then placed through the [place] catheter in the [place] catheter was removed. The 5 French sheath was removed and a 25cm 103F straight vascular sheath was placed. The dilator was removed and the sheath was flushed. Sheath was attached to pressurized and heparinized saline bag for constant forward flow. A coaxial system was then advanced over the roadrunner 035 wire. This included a 95cm 087 "Walrus" balloon guide with coaxial 125cm Berenstein diagnostic catheter. This was advanced to the distal cervical ICA segment. Wire and catheter were then removed. Double flush of the catheter was  performed. Angiogram was performed. Road map function was used once the occluded vessel was identified. Copious back flush was performed and the balloon catheter was attached to heparinized and pressurized saline bag for forward flow. We elected to use a direct first aspiration attempt for the first pass. A zoom 35 aspiration catheter was advanced with a coaxial synchro soft wire through the balloon guide. The aspiration catheter was advanced into the inferior division up to the site of occlusion. When the catheter was just proximal to the site of occlusion, wire was removed and the aspiration catheter was attached to the proprietary aspiration vacuum system. This was turned on, aspiration of blood was confirmed, and then the catheter was advanced to the site of occlusion until resistance was encountered and cessation of blood flow was observed in the aspiration tubing. We observed approximally 2 minutes of aspiration in this position. The balloon at the balloon guide catheter was then inflated under fluoroscopy for proximal flow arrest. Constant aspiration using the proprietary engine was then performed at the aspiration catheter, as the zoom 35 catheter was gently and slowly withdrawn with fluoroscopic observation. Once the catheter was removed from the balloon guide, free aspiration was confirmed at the hub of the balloon guide catheter, with free blood return confirmed. The balloon was then deflated, and a control angiogram was performed. Restoration of flow was confirmed. Angiogram of the cervical ICA was performed. Balloon guide was then completely removed. The skin at the puncture site was then cleaned with Chlorhexidine. The 8 French sheath was removed and an 103F angioseal was deployed. Flat panel CT was performed. Patient was extubated once the CT was reviewed. Patient tolerated the procedure well and remained hemodynamically stable throughout. No complications were encountered and no significant blood loss  encountered. IMPRESSION: Status post  ultrasound guided access of right common femoral artery for left-sided cervical/cerebral angiogram and mechanical thrombectomy of left M2 occlusion using direct aspiration technique with 1 pass and achieving TICI 3 flow. Angio-Seal for hemostasis. Signed, Dulcy Fanny. Dellia Nims, RPVI [facility] Radiology PLAN: ICU status Target systolic blood pressure of 120-140 Right hip straight time 4 hours Frequent neurovascular checks Repeat neurologic imaging with CT and/MRI at the discretion of neurology team Electronically Signed   By: Corrie Mckusick D.O.   On: 06/20/2021 08:19   IR US Guide Vasc Access Right  Result Date: 06/20/2021 INDICATION: 87 year old male presents for cervical and cerebral angiogram secondary to left M2 occlusion and acute stroke. EXAM: ULTRASOUND-GUIDED ACCESS RIGHT COMMON FEMORAL ARTERY LEFT-SIDED CERVICAL AND CEREBRAL ANGIOGRAM MECHANICAL THROMBECTOMY LEFT M2 ANGIO-SEAL FOR HEMOSTASIS. COMPARISON:  CT imaging same day MEDICATIONS: No intra arterial medications ANESTHESIA/SEDATION: The anesthesia team was present to provide general endotracheal tube anesthesia and for patient monitoring during the procedure. Intubation was performed in biplane/neurointerventional suite. Left radial arterial line was performed by the anesthesia team. Interventional neuro radiology nursing staff was also present. CONTRAST:  80 cc FLUOROSCOPY TIME:  Fluoroscopy Time: 9 minutes 30 seconds (675.8 mGy). COMPLICATIONS: None TECHNIQUE: Informed written consent was obtained from the patient's family after a thorough discussion of the procedural risks, benefits and alternatives. Specific risks discussed include: Bleeding, infection, contrast reaction, kidney injury/failure, need for further procedure/surgery, arterial injury or dissection, embolization to new territory, intracranial hemorrhage (10-15% risk), neurologic deterioration,  cardiopulmonary collapse, death. All questions were addressed. Maximal Sterile Barrier Technique was utilized including during the procedure including caps, mask, sterile gowns, sterile gloves, sterile drape, hand hygiene and skin antiseptic. A timeout was performed prior to the initiation of the procedure. The anesthesia team was present to provide general endotracheal tube anesthesia and for patient monitoring during the procedure. Interventional neuro radiology nursing staff was also present. FINDINGS: Initial Findings: Left common carotid artery: Common carotid artery with significant tortuosity after the origin from the aortic arch. Independent origin from the aortic arch. No significant atherosclerotic changes. Left external carotid artery: Patent with antegrade flow. Left internal carotid artery: Mild tortuosity of the cervical ICA segment. Moderate atherosclerosis at the origin of the ICA with less than 50% stenosis at the origin and minimal irregularity. Vertical and petrous segment patent with normal course caliber contour. Cavernous segment patent. Clinoid segment patent. Antegrade flow of the ophthalmic artery. Ophthalmic segment patent. Terminus patent. Left MCA: M1 segment patent. Insular and opercular segments patent. The inferior division is occluded just beyond the origin, with TICI 0: No perfusion or antegrade flow beyond site of occlusion of the affected division. Fetal PCOM configuration on the left perfusing the posterior circulation. Left ACA: A1 segment is patent. Patent anterior communicating artery with some cross-filling into the right-sided territory. Completion Findings: Left MCA: TICI 3: Complete perfusion of the territory after restoring flow through the affected inferior M2 division Flat panel CT performed demonstrates no intracranial hemorrhage. PROCEDURE: The anesthesia team was present to provide general endotracheal tube anesthesia and for patient monitoring during the procedure.  Intubation was performed in negative pressure [place] in neuro IR holding. Interventional neuro radiology nursing staff was also present. Ultrasound survey of the right inguinal region was performed with images stored and sent to PACs, confirming patency of the right common femoral artery. 11 blade scalpel was used to make a small incision. Blunt dissection was performed with US guidance. A micropuncture needle was used access the  right common femoral artery under ultrasound. With excellent arterial blood flow returned, an .018 micro wire was passed through the needle, observed to enter the abdominal aorta under fluoroscopy. The needle was removed, and a micropuncture sheath was placed over the wire. The inner dilator and wire were removed, and an 035 wire was advanced under fluoroscopy into the abdominal aorta. Five [place] standard sheath was placed. Standard 5 French cavus catheter was advanced over the J wire. Catheter was advanced to the proximal descending thoracic aorta and the wire was removed. Double flush was performed. Davis catheter was then used to select the left common carotid artery. A standard Glidewire was then advanced to the [place] catheter into the cervical ICA. Davis catheter was then advanced on the Glidewire into the distal cervical ICA. Glidewire was removed and contrast injected confirmed a luminal position. Exchange length roadrunner wire was then placed through the [place] catheter in the [place] catheter was removed. The 5 French sheath was removed and a 25cm 2F straight vascular sheath was placed. The dilator was removed and the sheath was flushed. Sheath was attached to pressurized and heparinized saline bag for constant forward flow. A coaxial system was then advanced over the roadrunner 035 wire. This included a 95cm 087 "[place]" balloon guide with coaxial 125cm Berenstein diagnostic catheter. This was advanced to the distal cervical ICA segment. Wire and catheter were then removed. Double  flush of the catheter was performed. Angiogram was performed. Road map function was used once the occluded vessel was identified. Copious back flush was performed and the balloon catheter was attached to heparinized and pressurized saline bag for forward flow. We elected to use a direct first aspiration attempt for the first pass. A zoom 35 aspiration catheter was advanced with a coaxial synchro soft wire through the balloon guide. The aspiration catheter was advanced into the inferior division up to the site of occlusion. When the catheter was just proximal to the site of occlusion, wire was removed and the aspiration catheter was attached to the proprietary aspiration vacuum system. This was turned on, aspiration of blood was confirmed, and then the catheter was advanced to the site of occlusion until resistance was encountered and cessation of blood flow was observed in the aspiration tubing. We observed approximally 2 minutes of aspiration in this position. The balloon at the balloon guide catheter was then inflated under fluoroscopy for proximal flow arrest. Constant aspiration using the proprietary engine was then performed at the aspiration catheter, as the zoom 35 catheter was gently and slowly withdrawn with fluoroscopic observation. Once the catheter was removed from the balloon guide, free aspiration was confirmed at the hub of the balloon guide catheter, with free blood return confirmed. The balloon was then deflated, and a control angiogram was performed. Restoration of flow was confirmed. Angiogram of the cervical ICA was performed. Balloon guide was then completely removed. The skin at the puncture site was then cleaned with Chlorhexidine. The 8 French sheath was removed and an 2F angioseal was deployed. Flat panel CT was performed. Patient was extubated once the CT was reviewed. Patient tolerated the procedure well and remained hemodynamically stable throughout. No complications were encountered and  no significant blood loss encountered. IMPRESSION: Status post ultrasound guided access of right common femoral artery for left-sided cervical/cerebral angiogram and mechanical thrombectomy of left M2 occlusion using direct aspiration technique with 1 pass and achieving TICI 3 flow. Angio-Seal for hemostasis. Signed, Dulcy Fanny. Dellia Nims, [place] [facility]  Radiology PLAN: ICU status Target systolic blood pressure of 120-140 Right hip straight time 4 hours Frequent neurovascular checks Repeat neurologic imaging with CT and/MRI at the discretion of neurology team Electronically Signed   By: Corrie Mckusick D.O.   On: 06/20/2021 08:19   DG Swallowing Func-Speech Pathology  Result Date: 06/20/2021 Table formatting from the original result was not included. Images from the original result were not included. Objective Swallowing Evaluation: Type of Study: MBS-Modified Barium Swallow Study  Patient Details Name: Devon Elliott. MRN: [id number] Date of Birth: 1927-05-24 Today's Date: 06/20/2021 Time: SLP Start Time (ACUTE ONLY): 1224 -SLP Stop Time (ACUTE ONLY): [id number] SLP Time Calculation (min) (ACUTE ONLY): 9 min Past Medical History: Past Medical History: Diagnosis Date  Complete tear of left rotator cuff 07/21/2014  Essential hypertension 04/19/2014  Impingement syndrome of left shoulder 07/21/2014  Left hip pain 02/08/2016  Left supraspinatus tenosynovitis 07/21/2014  Pure hypercholesterolemia 04/19/2014 Past Surgical History: Past Surgical History: Procedure Laterality Date  ELBOW SURGERY  1995  ESOPHAGOGASTRODUODENOSCOPY N/A 05/01/2020  Procedure: ESOPHAGOGASTRODUODENOSCOPY (EGD);  Surgeon: Toledo, Benay Pike, MD;  Location: [facility] ENDOSCOPY;  Service: Gastroenterology;  Laterality: N/A;  ESOPHAGOGASTRODUODENOSCOPY (EGD) WITH PROPOFOL N/A 05/31/2016  Procedure: ESOPHAGOGASTRODUODENOSCOPY (EGD) WITH PROPOFOL;  Surgeon: Jonathon Bellows, MD;  Location: [facility] ENDOSCOPY;  Service: Endoscopy;   Laterality: N/A;  IR CT HEAD LTD  06/19/2021  IR PERCUTANEOUS ART THROMBECTOMY/INFUSION INTRACRANIAL INC DIAG ANGIO  06/19/2021  IR US GUIDE VASC ACCESS RIGHT  06/19/2021  KYPHOPLASTY N/A 07/30/2019  Procedure: L1 KYPHOPLASTY;  Surgeon: Hessie Knows, MD;  Location: [facility] ORS;  Service: Orthopedics;  Laterality: N/A;  RADIOLOGY WITH ANESTHESIA N/A 06/19/2021  Procedure: IR WITH ANESTHESIA;  Surgeon: Radiologist, Medication, MD;  Location: [place];  Service: Radiology;  Laterality: N/A; HPI: 87 y/o M who has a PMH as outlined below including but not limited to HTN, HLD, AF not on AC.  He presented to [facility] 12/5 with aphasia and right sided weakness. CTA showed distal left M2 occlusion.  He was brought to [facility] for NIR mechanical thrombectomy.  He did not receive tPA as he was deemed to be outside of the window. Pt has PMHX with Complete tear of left rotator cuff; Essential hypertension; Impingement syndrome of left shoulder; Left hip pain; Left supraspinatus tenosynovitis; Pure hypercholesterolemia; Oral phase dysphagia; Esophageal dysphagia; Esophageal mass; AKI (acute kidney injury) ([place]); Hypernatremia; Cellulitis of left leg; Gastroesophageal reflux disease without esophagitis; Esophageal ulcer; Hypokalemia; AF (paroxysmal atrial fibrillation) ([place]); Cerebrovascular accident (CVA) ([place]); and Acute ischemic left MCA stroke ([place]).  Pt had MBSS in Dec 2017 showing functional oropharyngal swallowing  Subjective: Pt awake, alert, aphasic, confused  Recommendations for follow up therapy are one component of a multi-disciplinary discharge planning process, led by the attending physician.  Recommendations may be updated based on patient status, additional functional criteria and insurance authorization. Assessment / Plan / Recommendation Clinical Impressions 06/20/2021 Clinical Impression Pt presents with functional oropharyngeal swallow.  There was no penetration or aspiration noted with any consistencies.  Pt noted to clear throat  in absence of penetration/aspiration during this evaluation.  During pill simulation pt masticated tablet.  Recommend crushing medicines with puree.  There is a protrusion in the cervical esophagus below the UES which appears to be soft tissue like.  See picture below.  This abnormality does not affect swallow function.  Bolus flow is not impeded and there was no retention of contrast.  Pt has hx of esophageal dysphagia, esophageal ulcer, esophageal mass, and GERD  without esophagitis. Consider GI referral for evaluation of esophagus.  Pt has no further ST.  SLP to follow for speech-language evaluation.  SLP Visit Diagnosis Dysphagia, unspecified (R13.10) Attention and concentration deficit following -- Frontal lobe and executive function deficit following -- Impact on safety and function No limitations   Treatment Recommendations 06/20/2021 Treatment Recommendations No treatment recommended at this time   Prognosis 06/20/2021 Prognosis for Safe Diet Advancement (No Data) Barriers to Reach Goals -- Barriers/Prognosis Comment -- Diet Recommendations 06/20/2021 SLP Diet Recommendations Regular solids;Thin liquid Liquid Administration via -- Medication Administration Crushed with puree Compensations Slow rate;Small sips/bites;Minimize environmental distractions Postural Changes Seated upright at 90 degrees   Other Recommendations 06/20/2021 Recommended Consults Consider GI evaluation Oral Care Recommendations Oral care BID Other Recommendations -- Follow Up Recommendations No SLP follow up Assistance recommended at discharge Frequent or constant Supervision/Assistance Functional Status Assessment Patient has not had a recent decline in their functional status Frequency and Duration  06/20/2021 Speech Therapy Frequency (ACUTE ONLY) (No Data) Treatment Duration --   Oral Phase 06/20/2021 Oral Phase Impaired Oral - Pudding Teaspoon -- Oral - Pudding Cup -- Oral - Honey Teaspoon -- Oral - Honey Cup -- Oral - Nectar Teaspoon --  Oral - Nectar Cup -- Oral - Nectar Straw -- Oral - Thin Teaspoon -- Oral - Thin Cup WFL Oral - Thin Straw WFL Oral - Puree WFL Oral - Mech Soft -- Oral - Regular WFL Oral - Multi-Consistency -- Oral - Pill -- Oral Phase - Comment --  Pharyngeal Phase 06/20/2021 Pharyngeal Phase -- Pharyngeal- Pudding Teaspoon -- Pharyngeal -- Pharyngeal- Pudding Cup -- Pharyngeal -- Pharyngeal- Honey Teaspoon -- Pharyngeal -- Pharyngeal- Honey Cup -- Pharyngeal -- Pharyngeal- Nectar Teaspoon -- Pharyngeal -- Pharyngeal- Nectar Cup -- Pharyngeal -- Pharyngeal- Nectar Straw -- Pharyngeal -- Pharyngeal- Thin Teaspoon -- Pharyngeal -- Pharyngeal- Thin Cup -- Pharyngeal -- Pharyngeal- Thin Straw -- Pharyngeal -- Pharyngeal- Puree -- Pharyngeal -- Pharyngeal- Mechanical Soft -- Pharyngeal -- Pharyngeal- Regular Delayed swallow initiation-pyriform sinuses Pharyngeal -- Pharyngeal- Multi-consistency -- Pharyngeal -- Pharyngeal- Pill -- Pharyngeal -- Pharyngeal Comment --  Cervical Esophageal Phase  06/20/2021 Cervical Esophageal Phase Impaired Pudding Teaspoon -- Pudding Cup -- Honey Teaspoon -- Honey Cup -- Nectar Teaspoon -- Nectar Cup -- Nectar Straw -- Thin Teaspoon -- Thin Cup -- Thin Straw (No Data) Puree -- Mechanical Soft -- Regular -- Multi-consistency -- Pill -- Cervical Esophageal Comment -- Celedonio Savage , MA, CCC-SLP Acute Rehabilitation Services Office: [phone] 06/20/2021, 1:14 PM                     ECHOCARDIOGRAM COMPLETE  Result Date: 06/20/2021    ECHOCARDIOGRAM REPORT   Patient Name:   Devon Elliott. Date of Exam: 06/20/2021 Medical Rec #:  [id number]             Height:       69.0 in Accession #:    [id number]            Weight:       148.1 lb Date of Birth:  Jan 09, 1927              BSA:          1.819 m Patient Age:    30 years              BP:           152/85 mmHg Patient Gender: M  HR:           94 bpm. Exam Location:  Inpatient Procedure: 2D Echo, Cardiac Doppler and Color Doppler  Indications:    TIA  History:        Patient has no prior history of Echocardiogram examinations.  Sonographer:    Merrie Roof RDCS Referring Phys: [id number] [place]  1. Left ventricular ejection fraction, by estimation, is 45%. The left ventricle has mildly decreased function. The left ventricle demonstrates global hypokinesis. Left ventricular diastolic parameters are indeterminate.  2. Right ventricular systolic function is normal. The right ventricular size is mildly enlarged. There is mildly elevated pulmonary artery systolic pressure. The estimated right ventricular systolic pressure is [id number] mmHg.  3. Left atrial size was severely dilated.  4. Right atrial size was moderately dilated.  5. The mitral valve is grossly normal. No evidence of mitral valve regurgitation. No evidence of mitral stenosis.  6. The aortic valve was not well visualized. There is moderate calcification of the aortic valve. Aortic valve regurgitation is mild. Aortic valve sclerosis/calcification is present, without any evidence of aortic stenosis.  7. The inferior vena cava is normal in size with <50% respiratory variability, suggesting right atrial pressure of 8 mmHg. Comparison(s): No prior Echocardiogram. FINDINGS  Left Ventricle: Left ventricular ejection fraction, by estimation, is 45%. The left ventricle has mildly decreased function. The left ventricle demonstrates global hypokinesis. The left ventricular internal cavity size was normal in size. There is no left ventricular hypertrophy. Left ventricular diastolic parameters are indeterminate. Right Ventricle: The right ventricular size is mildly enlarged. No increase in right ventricular wall thickness. Right ventricular systolic function is normal. There is mildly elevated pulmonary artery systolic pressure. The tricuspid regurgitant velocity is 2.74 m/s, and with an assumed right atrial pressure of 8 mmHg, the estimated right ventricular systolic pressure is  99991111 mmHg. Left Atrium: Left atrial size was severely dilated. Right Atrium: Right atrial size was moderately dilated. Pericardium: There is no evidence of pericardial effusion. Mitral Valve: The mitral valve is grossly normal. Mild mitral annular calcification. No evidence of mitral valve regurgitation. No evidence of mitral valve stenosis. Tricuspid Valve: The tricuspid valve is normal in structure. Tricuspid valve regurgitation is mild. Aortic Valve: The aortic valve was not well visualized. There is moderate calcification of the aortic valve. Aortic valve regurgitation is mild. Aortic valve sclerosis/calcification is present, without any evidence of aortic stenosis. Aortic valve mean gradient measures 10.0 mmHg. Aortic valve peak gradient measures 17.6 mmHg. Aortic valve area, by VTI measures 1.24 cm. Pulmonic Valve: The pulmonic valve was normal in structure. Pulmonic valve regurgitation is not visualized. No evidence of pulmonic stenosis. Aorta: The aortic root and ascending aorta are structurally normal, with no evidence of dilitation. Venous: The inferior vena cava is normal in size with less than 50% respiratory variability, suggesting right atrial pressure of 8 mmHg. IAS/Shunts: The atrial septum is grossly normal.  LEFT VENTRICLE PLAX 2D LVIDd:         5.00 cm LVIDs:         3.60 cm LV PW:         0.90 cm LV IVS:        0.80 cm LVOT diam:     2.10 cm LV SV:         44 LV SV Index:   24 LVOT Area:     3.46 cm  RIGHT VENTRICLE          IVC RV Basal diam:  4.40 cm  IVC diam: 2.00 cm RV Mid diam:    4.80 cm LEFT ATRIUM              Index        RIGHT ATRIUM           Index LA diam:        4.20 cm  2.31 cm/m   RA Area:     24.40 cm LA Vol (A2C):   121.0 ml 66.53 ml/m  RA Volume:   83.80 ml  46.08 ml/m LA Vol (A4C):   91.5 ml  50.31 ml/m LA Biplane Vol: 110.0 ml 60.49 ml/m  AORTIC VALVE AV Area (Vmax):    1.36 cm AV Area (Vmean):   1.23 cm AV Area (VTI):     1.24 cm AV Vmax:           210.00 cm/s AV  Vmean:          147.000 cm/s AV VTI:            0.355 m AV Peak Grad:      17.6 mmHg AV Mean Grad:      10.0 mmHg LVOT Vmax:         82.70 cm/s LVOT Vmean:        52.300 cm/s LVOT VTI:          0.127 m LVOT/AV VTI ratio: 0.36  AORTA Ao Root diam: 3.40 cm Ao Asc diam:  3.40 cm TRICUSPID VALVE TR Peak grad:   30.0 mmHg TR Vmax:        274.00 cm/s  SHUNTS Systemic VTI:  0.13 m Systemic Diam: 2.10 cm Rudean Haskell MD Electronically signed by Rudean Haskell MD Signature Date/Time: 06/20/2021/5:02:23 PM    Final    IR PERCUTANEOUS ART THROMBECTOMY/INFUSION INTRACRANIAL INC DIAG ANGIO  Result Date: 06/20/2021 INDICATION: 87 year old male presents for cervical and cerebral angiogram secondary to left M2 occlusion and acute stroke. EXAM: ULTRASOUND-GUIDED ACCESS RIGHT COMMON FEMORAL ARTERY LEFT-SIDED CERVICAL AND CEREBRAL ANGIOGRAM MECHANICAL THROMBECTOMY LEFT M2 ANGIO-SEAL FOR HEMOSTASIS. COMPARISON:  CT imaging same day MEDICATIONS: No intra arterial medications ANESTHESIA/SEDATION: The anesthesia team was present to provide general endotracheal tube anesthesia and for patient monitoring during the procedure. Intubation was performed in biplane/neurointerventional suite. Left radial arterial line was performed by the anesthesia team. Interventional neuro radiology nursing staff was also present. CONTRAST:  80 cc FLUOROSCOPY TIME:  Fluoroscopy Time: 9 minutes 30 seconds (675.8 mGy). COMPLICATIONS: None TECHNIQUE: Informed written consent was obtained from the patient's family after a thorough discussion of the procedural risks, benefits and alternatives. Specific risks discussed include: Bleeding, infection, contrast reaction, kidney injury/failure, need for further procedure/surgery, arterial injury or dissection, embolization to new territory, intracranial hemorrhage (10-15% risk), neurologic deterioration, cardiopulmonary collapse, death. All questions were addressed. Maximal Sterile Barrier Technique was  utilized including during the procedure including caps, mask, sterile gowns, sterile gloves, sterile drape, hand hygiene and skin antiseptic. A timeout was performed prior to the initiation of the procedure. The anesthesia team was present to provide general endotracheal tube anesthesia and for patient monitoring during the procedure. Interventional neuro radiology nursing staff was also present. FINDINGS: Initial Findings: Left common carotid artery: Common carotid artery with significant tortuosity after the origin from the aortic arch. Independent origin from the aortic arch. No significant atherosclerotic changes. Left external carotid artery: Patent with antegrade flow. Left internal carotid artery: Mild tortuosity of the cervical ICA segment. Moderate atherosclerosis at the origin of the ICA with  less than 50% stenosis at the origin and minimal irregularity. Vertical and petrous segment patent with normal course caliber contour. Cavernous segment patent. Clinoid segment patent. Antegrade flow of the ophthalmic artery. Ophthalmic segment patent. Terminus patent. Left MCA: M1 segment patent. Insular and opercular segments patent. The inferior division is occluded just beyond the origin, with TICI 0: No perfusion or antegrade flow beyond site of occlusion of the affected division. Fetal PCOM configuration on the left perfusing the posterior circulation. Left ACA: A1 segment is patent. Patent anterior communicating artery with some cross-filling into the right-sided territory. Completion Findings: Left MCA: TICI 3: Complete perfusion of the territory after restoring flow through the affected inferior M2 division Flat panel CT performed demonstrates no intracranial hemorrhage. PROCEDURE: The anesthesia team was present to provide general endotracheal tube anesthesia and for patient monitoring during the procedure. Intubation was performed in negative pressure [place] in neuro IR holding. Interventional neuro radiology  nursing staff was also present. Ultrasound survey of the right inguinal region was performed with images stored and sent to PACs, confirming patency of the right common femoral artery. 11 blade scalpel was used to make a small incision. Blunt dissection was performed with US guidance. A micropuncture needle was used access the right common femoral artery under ultrasound. With excellent arterial blood flow returned, an .018 micro wire was passed through the needle, observed to enter the abdominal aorta under fluoroscopy. The needle was removed, and a micropuncture sheath was placed over the wire. The inner dilator and wire were removed, and an 035 wire was advanced under fluoroscopy into the abdominal aorta. Five [place] standard sheath was placed. Standard 5 French cavus catheter was advanced over the J wire. Catheter was advanced to the proximal descending thoracic aorta and the wire was removed. Double flush was performed. Davis catheter was then used to select the left common carotid artery. A standard Glidewire was then advanced to the [place] catheter into the cervical ICA. Davis catheter was then advanced on the Glidewire into the distal cervical ICA. Glidewire was removed and contrast injected confirmed a luminal position. Exchange length roadrunner wire was then placed through the [place] catheter in the [place] catheter was removed. The 5 French sheath was removed and a 25cm 73F straight vascular sheath was placed. The dilator was removed and the sheath was flushed. Sheath was attached to pressurized and heparinized saline bag for constant forward flow. A coaxial system was then advanced over the roadrunner 035 wire. This included a 95cm 087 "[place]" balloon guide with coaxial 125cm Berenstein diagnostic catheter. This was advanced to the distal cervical ICA segment. Wire and catheter were then removed. Double flush of the catheter was performed. Angiogram was performed. Road map function was used once the  occluded vessel was identified. Copious back flush was performed and the balloon catheter was attached to heparinized and pressurized saline bag for forward flow. We elected to use a direct first aspiration attempt for the first pass. A zoom 35 aspiration catheter was advanced with a coaxial synchro soft wire through the balloon guide. The aspiration catheter was advanced into the inferior division up to the site of occlusion. When the catheter was just proximal to the site of occlusion, wire was removed and the aspiration catheter was attached to the proprietary aspiration vacuum system. This was turned on, aspiration of blood was confirmed, and then the catheter was advanced to the site of occlusion until resistance was encountered and cessation of blood flow was observed in the aspiration  tubing. We observed approximally 2 minutes of aspiration in this position. The balloon at the balloon guide catheter was then inflated under fluoroscopy for proximal flow arrest. Constant aspiration using the proprietary engine was then performed at the aspiration catheter, as the zoom 35 catheter was gently and slowly withdrawn with fluoroscopic observation. Once the catheter was removed from the balloon guide, free aspiration was confirmed at the hub of the balloon guide catheter, with free blood return confirmed. The balloon was then deflated, and a control angiogram was performed. Restoration of flow was confirmed. Angiogram of the cervical ICA was performed. Balloon guide was then completely removed. The skin at the puncture site was then cleaned with Chlorhexidine. The 8 French sheath was removed and an 5F angioseal was deployed. Flat panel CT was performed. Patient was extubated once the CT was reviewed. Patient tolerated the procedure well and remained hemodynamically stable throughout. No complications were encountered and no significant blood loss encountered. IMPRESSION: Status post ultrasound guided access of right  common femoral artery for left-sided cervical/cerebral angiogram and mechanical thrombectomy of left M2 occlusion using direct aspiration technique with 1 pass and achieving TICI 3 flow. Angio-Seal for hemostasis. Signed, Dulcy Fanny. Dellia Nims, RPVI [facility] Radiology PLAN: ICU status Target systolic blood pressure of 120-140 Right hip straight time 4 hours Frequent neurovascular checks Repeat neurologic imaging with CT and/MRI at the discretion of neurology team Electronically Signed   By: Corrie Mckusick D.O.   On: 06/20/2021 08:19   CT HEAD CODE STROKE WO CONTRAST  Result Date: 06/19/2021 CLINICAL DATA:  Code stroke. EXAM: CT HEAD WITHOUT CONTRAST TECHNIQUE: Contiguous axial images were obtained from the base of the skull through the vertex without intravenous contrast. COMPARISON:  December 2020 FINDINGS: Brain: There is no acute intracranial hemorrhage, mass effect, or edema. No new loss of gray-white differentiation. Confluent areas of hypodensity in the supratentorial white matter are nonspecific but may reflect advanced chronic microvascular ischemic changes. Prominence of the ventricles and sulci reflects parenchymal volume loss. These changes have progressed since 2020. No extra-axial collection. Vascular: No hyperdense vessel. There is intracranial atherosclerotic calcification at the skull base. Skull: Unremarkable. Sinuses/Orbits: No acute finding. Other: Mastoid air cells are clear. ASPECTS ([place] Stroke Program Early CT Score) - Ganglionic level infarction (caudate, lentiform nuclei, internal capsule, insula, M1-M3 cortex): 7 - Supraganglionic infarction (M4-M6 cortex): 3 Total score (0-10 with 10 being normal): 10 IMPRESSION: There is no acute intracranial hemorrhage or evidence of acute infarction. ASPECT score is 10. Parenchymal volume loss and advanced chronic microvascular ischemic changes with progression since 2020. Preliminary results were  communicated to Dr. Quinn Axe at 12:24 pm on 06/19/2021 by text page via the [facility] messaging system. Electronically Signed   By: Macy Mis M.D.   On: 06/19/2021 12:29   CT ANGIO HEAD NECK W WO CM W PERF (CODE STROKE)  Result Date: 06/19/2021 CLINICAL DATA:  Neuro deficit, acute, stroke suspected EXAM: CT ANGIOGRAPHY HEAD AND NECK CT PERFUSION BRAIN TECHNIQUE: Multidetector CT imaging of the head and neck was performed using the standard protocol during bolus administration of intravenous contrast. Multiplanar CT image reconstructions and MIPs were obtained to evaluate the vascular anatomy. Carotid stenosis measurements (when applicable) are obtained utilizing NASCET criteria, using the distal internal carotid diameter as the denominator. Multiphase CT imaging of the brain was performed following IV bolus contrast injection. Subsequent parametric perfusion maps were calculated using RAPID software. CONTRAST:  111mL OMNIPAQUE IOHEXOL 350 MG/ML SOLN COMPARISON:  None. FINDINGS: CTA NECK Aortic arch: Mild mixed plaque. Great vessel origins are patent without high-grade stenosis. Right carotid system: Patent. Eccentric noncalcified plaque along the common carotid with minimal stenosis. Mixed but primarily noncalcified plaque at the proximal ICA causing less than 50% stenosis. Left carotid system: Patent. Noncalcified plaque along the common carotid with minimal stenosis. Mixed but primarily calcified plaque along the proximal internal carotid causing less than 50% stenosis. Vertebral arteries: Patent dominant left vertebral artery. Right vertebral artery is diminutive in caliber but patent to the skull base. Skeleton: Advanced degenerative changes of the cervical spine. Other neck: Unremarkable. Upper chest: Emphysema. Review of the MIP images confirms the above findings CTA HEAD Anterior circulation: Intracranial internal carotid arteries are patent with calcified plaque causing mild stenosis. Left M1 MCA is patent.  There is occlusion of distal left M2 posterior division within the posterior left sylvian fissure. Right middle and both anterior cerebral arteries are patent. There is focal moderate stenosis of the right M1 MCA. Posterior circulation: Intracranial left vertebral artery is patent with mild calcified plaque. 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 Basilar artery is patent. Major cerebellar artery origins are patent. A left posterior communicating artery is present. Posterior cerebral arteries are patent. There is focal moderate to marked stenosis of the distal left P2 PCA. Venous sinuses: Patent as allowed by contrast bolus timing. Review of the MIP images confirms the above findings CT Brain Perfusion Findings: CBF (<30%) Volume: 0mL Perfusion (Tmax>6.0s) volume: 90mL Mismatch Volume: 90mL Infarction Location: Left MCA territory. There is also some involvement of the right frontal periventricular white matter and temporal lobe which is likely artifactual. IMPRESSION: Distal left M2 MCA occlusion within the posterior left sylvian fissure. Perfusion imaging demonstrates no evidence of core infarction. 90 mL of penumbra in the left MCA territory is likely mildly overestimated given some artifactual contralateral contribution. Plaque at the ICA origins causes less than 50% stenosis. Diminutive but patent extracranial right vertebral artery probably on a congenital basis. Apparent occlusion intracranially is probably not acute. The dominant left vertebral artery is patent. Moderate stenosis right M1 MCA. Moderate to marked stenosis distal left P2 PCA. Preliminary results were communicated to Dr. Selina CooleyStack at 12:50 pm on 06/19/2021 by text page [facility]. Electronically Signed   By: Guadlupe SpanishPraneil  Patel M.D.   On: 06/19/2021 13:06    Labs:  CBC: Recent Labs    06/19/21 1207 06/19/21 1710 06/21/21 0323  WBC 6.7 9.3 11.1*  HGB 12.7* 12.2* 11.2*  HCT 39.1 37.0* 35.3*  PLT 172  167 161    COAGS: Recent Labs    06/19/21 1207  INR 1.0  APTT 30    BMP: Recent Labs    06/19/21 1207 06/19/21 1710 06/21/21 0323  NA 140  --  142  K 4.3  --  4.0  CL 109  --  112*  CO2 28  --  21*  GLUCOSE 113*  --  106*  BUN 19  --  20  CALCIUM 8.6*  --  7.7*  CREATININE 1.12 1.22 1.20  GFRNONAA >60 56* 57*    LIVER FUNCTION TESTS: Recent Labs    06/19/21 1207  BILITOT 0.6  AST 21  ALT 10  ALKPHOS 56  PROT 6.3*  ALBUMIN 3.8    Assessment and Plan:  CVA  LMCA revascularization in IR with Dr Loreta AveWagner 12/5 On Lovenox CTA to be read Will follow and report to Dr Loreta AveWagner  Electronically Signed: Rinaldo CloudPamela  A Nonie Hoyer, PA-C 06/21/2021, 12:55 PM   I spent a total of 15 Minutes at the the patient's bedside AND on the patient's hospital floor or unit, greater than 50% of which was counseling/coordinating care for L MCA revascularization

## 2021-06-21 NOTE — Progress Notes (Signed)
OT Cancellation Note  Patient Details Name: Devon Elliott. MRN: [id number] DOB: 05-06-1927   Cancelled Treatment:    Reason Eval/Treat Not Completed: Patient not medically ready  Mateo Flow 06/21/2021, 3:35 PM

## 2021-06-21 NOTE — Progress Notes (Addendum)
ANTICOAGULATION CONSULT NOTE - Initial Consult  Pharmacy Consult for IV Heparin Indication: atrial fibrillation  Allergies  Allergen Reactions   Ace Inhibitors     Other reaction(s): Kidney Disorder   Clarithromycin Hives   Etodolac     Other reaction(s): Kidney Disorder   Levofloxacin Swelling    Patient Measurements:  Weight 67.2 kg Height 5'9" Heparin Dosing Weight: 67.2 kg  Vital Signs: Temp: 97.9 F (36.6 C) (12/07 0800) Temp Source: Axillary (12/07 0800) BP: 171/126 (12/07 1444) Pulse Rate: 115 (12/07 1448)  Labs: Recent Labs    06/19/21 1207 06/19/21 1710 06/21/21 0323  HGB 12.7* 12.2* 11.2*  HCT 39.1 37.0* 35.3*  PLT 172 167 161  APTT 30  --   --   LABPROT 13.3  --   --   INR 1.0  --   --   CREATININE 1.12 1.22 1.20    CrCl cannot be calculated (Unknown ideal weight.).   Medical History: Past Medical History:  Diagnosis Date   Complete tear of left rotator cuff 07/21/2014   Essential hypertension 04/19/2014   Impingement syndrome of left shoulder 07/21/2014   Left hip pain 02/08/2016   Left supraspinatus tenosynovitis 07/21/2014   Pure hypercholesterolemia 04/19/2014    Medications:  Scheduled:   aspirin  81 mg Oral Daily   atorvastatin  40 mg Oral Daily   Chlorhexidine Gluconate Cloth  6 each Topical Daily   enoxaparin (LOVENOX) injection  40 mg Subcutaneous Q24H   [START ON 06/22/2021] memantine  5 mg Oral q morning   metoprolol succinate  25 mg Oral Daily   QUEtiapine  12.5 mg Oral Q0600   QUEtiapine  25 mg Oral QHS    Assessment: 87 years of age male who presented with 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 Pharmacy consulted to start IV heparin therapy.   Post-IR CT head - no bleed seen.  CBC stable. Platelets within normal limit.  Last dose of Lovenox 12/6 at 2200 PM.   Goal of Therapy:  Heparin level 0.3 to 0.5  units/ml Monitor platelets by anticoagulation protocol: Yes   Plan:  Start heparin infusion at 850 units/hr Check anti-Xa level in 8 hours and daily while on heparin Continue to monitor H&H and platelets  Link Snuffer, PharmD, BCPS, BCCCP Clinical Pharmacist Please refer to [facility] for [facility] Pharmacy numbers 06/21/2021,2:51 PM

## 2021-06-21 NOTE — Progress Notes (Signed)
eLink Physician-Brief Progress Note Patient Name: Devon Elliott. DOB: [birth year] MRN: [id number]   Date of Service  06/21/2021  HPI/Events of Note  Agitation - Nursing request to reorder bilateral ankle and wrist restraints.   eICU Interventions  Will reorder bilateral ankle, wrist and Posey belt restraints X 11 hours.      Intervention Category Major Interventions: Delirium, psychosis, severe agitation - evaluation and management  Devon Elliott 06/21/2021, 10:42 PM

## 2021-06-21 NOTE — Anesthesia Postprocedure Evaluation (Signed)
Anesthesia Post Note  Patient: Devon Elliott.  Procedure(s) Performed: IR WITH ANESTHESIA     Patient location during evaluation: ICU Anesthesia Type: General Level of consciousness: awake, responds to stimulation and patient cooperative Pain management: pain level controlled Vital Signs Assessment: post-procedure vital signs reviewed and stable Respiratory status: spontaneous breathing, nonlabored ventilation, respiratory function stable and patient connected to nasal cannula oxygen Cardiovascular status: blood pressure returned to baseline and stable Postop Assessment: no apparent nausea or vomiting Anesthetic complications: no   No notable events documented.  Last Vitals:  Vitals:   06/21/21 1030 06/21/21 1327  BP: (!) 108/55   Pulse: 98 (!) 117  Resp: 17 (!) 25  Temp:    SpO2: 98% 97%    Last Pain:  Vitals:   06/21/21 0800  TempSrc: Axillary  PainSc:                  [name]

## 2021-06-21 NOTE — Progress Notes (Signed)
STROKE TEAM PROGRESS NOTE   INTERVAL HISTORY Patient is seen in his room with his son at the bedside.  He remains delirious, confused and agitated requiring Precedex drip.  He is unlikely to be cooperative for MRI hence CT head and CT angiogram were obtained which showed no acute abnormality and patent left MCA and stable changes of intracranial atherosclerosis.  Patient continues to refuse cooperation and taking his medications.  His son admits patient has baseline dementia and cognitive issues Vitals:   06/21/21 0900 06/21/21 0930 06/21/21 1000 06/21/21 1030  BP: (!) 170/77 (!) 162/89 (!) 146/77 (!) 108/55  Pulse: (!) 117 (!) 111 (!) 110 98  Resp: (!) 26 (!) Temp:      TempSrc:      SpO2: 97% 96% 94% 98%   CBC:  Recent Labs  Lab 06/19/21 1207 06/19/21 1710 06/21/21 0323  WBC 6.7 9.3 11.1*  NEUTROABS 5.1  --   --   HGB 12.7* 12.2* 11.2*  HCT 39.1 37.0* 35.3*  MCV 94.7 93.0 94.4  PLT 172 167 161   Basic Metabolic Panel:  Recent Labs  Lab 06/19/21 1207 06/19/21 1710 06/21/21 0323  NA 140  --  142  K 4.3  --  4.0  CL 109  --  112*  CO2 28  --  21*  GLUCOSE 113*  --  106*  BUN 19  --  20  CREATININE 1.12 1.22 1.20  CALCIUM 8.6*  --  7.7*   Lipid Panel:  Recent Labs  Lab 06/20/21 0359  CHOL 198  TRIG 44  HDL 79  CHOLHDL 2.5  VLDL 9  LDLCALC 110*   HgbA1c:  Recent Labs  Lab 06/20/21 0359  HGBA1C 5.3   Urine Drug Screen:  Recent Labs  Lab 06/19/21 1315  LABOPIA NONE DETECTED  COCAINSCRNUR NONE DETECTED  LABBENZ POSITIVE*  AMPHETMU NONE DETECTED  THCU NONE DETECTED  LABBARB NONE DETECTED    Alcohol Level  Recent Labs  Lab 06/19/21 1207  ETH <10    IMAGING past 24 hours CT ANGIO HEAD W OR WO CONTRAST  Result Date: 06/21/2021 CLINICAL DATA:  Stroke follow-up. Recent thrombectomy for left M2 occlusion. EXAM: CT ANGIOGRAPHY HEAD TECHNIQUE: Multidetector CT imaging of the head was performed using the standard protocol during bolus  administration of intravenous contrast. Multiplanar CT image reconstructions and MIPs were obtained to evaluate the vascular anatomy. CONTRAST:  75mL OMNIPAQUE IOHEXOL 350 MG/ML SOLN COMPARISON:  Head CT and CTA 06/19/2021 FINDINGS: CT HEAD Brain: No definite acute cortically based infarct, intracranial hemorrhage, mass, midline shift, or extra-axial fluid collection is identified. Confluent hypodensities in the cerebral white matter bilaterally are unchanged and nonspecific but compatible with extensive chronic small vessel ischemic disease. There is mild cerebral atrophy. Vascular: Calcified atherosclerosis at the skull base. Skull: No fracture or suspicious osseous lesion. Sinuses: Paranasal sinuses and mastoid air cells are clear. Other: Bilateral cataract extraction. CTA HEAD Anterior circulation: The included internal carotid arteries are patent with unchanged mild stenosis of the distal left cervical ICA and no significant intracranial ICA stenosis. ACAs and MCAs are patent without evidence of a proximal branch occlusion. The revascularized left M2 vessel remains patent. There unchanged moderate right M1, moderate proximal left M2, and severe proximal left A1 stenoses. No aneurysm is identified. Posterior circulation: The intracranial left vertebral artery is widely patent and supplies the basilar. 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 Patent left PICA, bilateral AICA, and  bilateral SCA origins are identified. The basilar artery is widely patent. There is a large left posterior communicating artery. Both PCAs are patent with unchanged mild right P1 narrowing and moderate to severe stenoses of the distal left P2 segment and right P2 bifurcation. No aneurysm is identified. Venous sinuses: As permitted by contrast timing, patent. Anatomic variants: None. Review of the MIP images confirms the above findings. IMPRESSION: 1. No evidence of acute intracranial abnormality. No  new intracranial large vessel occlusion. 2. Persistent patency of the revascularized left M2 vessel. 3. 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 4. Unchanged intracranial atherosclerosis with moderate to severe anterior and posterior circulation stenoses as detailed above. Electronically Signed   By: Sebastian Ache M.D.   On: 06/21/2021 12:24   ECHOCARDIOGRAM COMPLETE  Result Date: 06/20/2021    ECHOCARDIOGRAM REPORT   Patient Name:   Zaiah Eckerson. Date of Exam: 06/20/2021 Medical Rec #:  [id number]             Height:       69.0 in Accession #:    [id number]            Weight:       148.1 lb Date of Birth:  [birth year]              BSA:          1.819 m Patient Age:    87 years              BP:           152/85 mmHg Patient Gender: M                     HR:           94 bpm. Exam Location:  Inpatient Procedure: 2D Echo, Cardiac Doppler and Color Doppler Indications:    TIA  History:        Patient has no prior history of Echocardiogram examinations.  Sonographer:    Roosvelt Maser RDCS Referring Phys: [id number] [facility] IMPRESSIONS  1. Left ventricular ejection fraction, by estimation, is 45%. The left ventricle has mildly decreased function. The left ventricle demonstrates global hypokinesis. Left ventricular diastolic parameters are indeterminate.  2. Right ventricular systolic function is normal. The right ventricular size is mildly enlarged. There is mildly elevated pulmonary artery systolic pressure. The estimated right ventricular systolic pressure is 38.0 mmHg.  3. Left atrial size was severely dilated.  4. Right atrial size was moderately dilated.  5. The mitral valve is grossly normal. No evidence of mitral valve regurgitation. No evidence of mitral stenosis.  6. The aortic valve was not well visualized. There is moderate calcification of the aortic valve. Aortic valve regurgitation is mild. Aortic valve sclerosis/calcification is present, without any evidence of  aortic stenosis.  7. The inferior vena cava is normal in size with <50% respiratory variability, suggesting right atrial pressure of 8 mmHg. Comparison(s): No prior Echocardiogram. FINDINGS  Left Ventricle: Left ventricular ejection fraction, by estimation, is 45%. The left ventricle has mildly decreased function. The left ventricle demonstrates global hypokinesis. The left ventricular internal cavity size was normal in size. There is no left ventricular hypertrophy. Left ventricular diastolic parameters are indeterminate. Right Ventricle: The right ventricular size is mildly enlarged. No increase in right ventricular wall thickness. Right ventricular systolic function is normal. There is mildly elevated pulmonary artery systolic pressure. The tricuspid regurgitant velocity is 2.74 m/s, and with an  assumed right atrial pressure of 8 mmHg, the estimated right ventricular systolic pressure is 38.0 mmHg. Left Atrium: Left atrial size was severely dilated. Right Atrium: Right atrial size was moderately dilated. Pericardium: There is no evidence of pericardial effusion. Mitral Valve: The mitral valve is grossly normal. Mild mitral annular calcification. No evidence of mitral valve regurgitation. No evidence of mitral valve stenosis. Tricuspid Valve: The tricuspid valve is normal in structure. Tricuspid valve regurgitation is mild. Aortic Valve: The aortic valve was not well visualized. There is moderate calcification of the aortic valve. Aortic valve regurgitation is mild. Aortic valve sclerosis/calcification is present, without any evidence of aortic stenosis. Aortic valve mean gradient measures 10.0 mmHg. Aortic valve peak gradient measures 17.6 mmHg. Aortic valve area, by VTI measures 1.24 cm. Pulmonic Valve: The pulmonic valve was normal in structure. Pulmonic valve regurgitation is not visualized. No evidence of pulmonic stenosis. Aorta: The aortic root and ascending aorta are structurally normal, with no evidence  of dilitation. Venous: The inferior vena cava is normal in size with less than 50% respiratory variability, suggesting right atrial pressure of 8 mmHg. IAS/Shunts: The atrial septum is grossly normal.  LEFT VENTRICLE PLAX 2D LVIDd:         5.00 cm LVIDs:         3.60 cm LV PW:         0.90 cm LV IVS:        0.80 cm LVOT diam:     2.10 cm LV SV:         44 LV SV Index:   24 LVOT Area:     3.46 cm  RIGHT VENTRICLE          IVC RV Basal diam:  4.40 cm  IVC diam: 2.00 cm RV Mid diam:    4.80 cm LEFT ATRIUM              Index        RIGHT ATRIUM           Index LA diam:        4.20 cm  2.31 cm/m   RA Area:     24.40 cm LA Vol (A2C):   121.0 ml 66.53 ml/m  RA Volume:   83.80 ml  46.08 ml/m LA Vol (A4C):   91.5 ml  50.31 ml/m LA Biplane Vol: 110.0 ml 60.49 ml/m  AORTIC VALVE AV Area (Vmax):    1.36 cm AV Area (Vmean):   1.23 cm AV Area (VTI):     1.24 cm AV Vmax:           210.00 cm/s AV Vmean:          147.000 cm/s AV VTI:            0.355 m AV Peak Grad:      17.6 mmHg AV Mean Grad:      10.0 mmHg LVOT Vmax:         82.70 cm/s LVOT Vmean:        52.300 cm/s LVOT VTI:          0.127 m LVOT/AV VTI ratio: 0.36  AORTA Ao Root diam: 3.40 cm Ao Asc diam:  3.40 cm TRICUSPID VALVE TR Peak grad:   30.0 mmHg TR Vmax:        274.00 cm/s  SHUNTS Systemic VTI:  0.13 m Systemic Diam: 2.10 cm Riley Lam MD Electronically signed by Riley Lam MD Signature Date/Time: 06/20/2021/5:02:23 PM    Final  PHYSICAL EXAM General:  Patient is a confused, alert male in no acute distress. . Afebrile. Head is nontraumatic. Neck is supple without bruit.    Cardiac exam no murmur or gallop. Lungs are clear to auscultation. Distal pulses are well felt.   Neurological Exam :   Patient is oriented to name only.  He will respond to questions, but answers are not always appropriate.  He is confused and delirious.  Diminished attention, registration and recall.  Follows simple midline and one-step commands only.  He is  able to move all extremities purposefully in response to commands. EOMI, no facial droop.  No focal weakness.  ASSESSMENT/PLAN [name]. is a 87 y.o. male with history of HTN, atrial fibrillation not on anticoagulation and mild dementia presenting with aphasia and right sided weakness. He was found to have an acute left MCA M2 occlusion with a large mismatch on CT perfusion.  He was taken to IR for mechanical thrombectomy. TICI 3 flow was achieved. Overnight, he has required Cleviprex for BP control and had an episode of agitation requiring sedation with ativan.  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  Patient has delirium likely due to sundowning from mild cognitive impairment/dementia at baseline Code Stroke CT head No acute abnormality. Chronic microvascular disease. ASPECTS 10.    CTA head & neck Less than 50% stenosis of right and left carotid arteries, distal left M2 MCA occlusion CT perfusion 90 mL of penumbra in left MCA territory Cerebral angio s/p mechanical thrombectomy of left M2 MCA Post IR CT no ICH/SAH MRI not done due to patient's agitation 2D Echo ejection fraction 45%.  Global hypokinesis.  Dilated left atrium.  No clot  LDL 110 HgbA1c 5.3 VTE prophylaxis - lovenox    Diet   Diet Heart Room service appropriate? Yes with Assist; Fluid consistency: Thin   No antithrombotic prior to admission, now on aspirin 81 mg daily.  Therapy recommendations:  pending Disposition:  pending  Hypertension Home meds:  metoprolol 25 mg daily Unstable Requiring Cleviprex Kepp SBP 120-140 post thrombectomy Long-term BP goal normotensive  Hyperlipidemia Home meds:  none LDL 110, goal < 70 Add Atorvastatin 40 mg  High intensity statin initiated Continue statin at discharge  Atrial fibrillation Will likely need  anticoagulation with a DOAC  Other Stroke Risk Factors Advanced Age >/= 47   Other Active Problems Delirium/mild dementia Reorient PRN Delirium precautions  Hospital day # 2  Patient continues to be delirious and confused requiring IV Precedex for sedation.  Patient encouraged to take p.o. diet and medications and start Seroquel 12.5 mg in the morning and 25 mg at night for agitation and to wean Precedex drip.  Wean restraints as tolerated.  Mobilize out of bed.  Therapy consults.  Start IV heparin drip for anticoagulation.  Long discussion with patient and son at the bedside and answered questions.  Discussed with Dr. Lynnell Catalan critical care medicine.   This patient is critically ill and at significant risk of neurological worsening, death and care requires constant monitoring of vital signs, hemodynamics,respiratory and cardiac monitoring, extensive review of multiple databases, frequent neurological assessment, discussion with family, other specialists and medical decision making of high complexity.I have made any additions or clarifications directly to the above note.This critical care time does not reflect procedure time, or teaching time or supervisory time of PA/NP/Med Resident  etc but could involve care discussion time.  I spent 30 minutes of neurocritical care time  in the care of  this patient.       Delia Heady, MD Medical Director [facility] Pager: [phone] 06/21/2021 1:26 PM   To contact Stroke Continuity provider, please refer to [email or web address]. After hours, contact General Neurology.

## 2021-06-21 NOTE — Progress Notes (Signed)
PT Cancellation Note  Patient Details Name: Devon Elliott. MRN: [id number] DOB: 10-23-1926   Cancelled Treatment:    Reason Eval/Treat Not Completed: Medical issues which prohibited therapy - pt with NRB donned, per RN pt received sedating medication for anxiety and plan for head CT. PT to check back as schedule allows.  Marye Round, PT DPT Acute Rehabilitation Services Pager [phone]  Office [phone]    Truddie Coco 06/21/2021, 10:49 AM

## 2021-06-21 NOTE — TOC CAGE-AID Note (Signed)
Transition of Care [facility]) - CAGE-AID Screening   Patient Details  Name: Devon Elliott. MRN: [id number] Date of Birth: [birth year]  Transition of Care [facility]) CM/SW Contact:    Taziyah Iannuzzi C Tarpley-Carter, LCSWA Phone Number: 06/21/2021, 8:50 AM   Clinical Narrative: Pt is unable to participate in Cage Aid.  Jacia Sickman Tarpley-Carter, MSW, LCSW-A Pronouns:  She/Her/Hers [facility] HealthTransitions of Care Clinical Social Worker Direct Number:  [phone] [email or web address]  CAGE-AID Screening:               Substance Abuse Education Offered: No

## 2021-06-21 NOTE — Progress Notes (Signed)
NAME:  Devon Fladger., MRN:  [id number], DOB:  03/25/1927, LOS: 2 ADMISSION DATE:  06/19/2021, CONSULTATION DATE:  06/19/21 REFERRING MD:  Erlinda Hong CHIEF COMPLAINT:  AMS   History of Present Illness:  87 y/o M who has a PMH as outlined below including but not limited to HTN, HLD, AF not on AC.  He presented to [facility] 12/5 with aphasia and right sided weakness. CTA showed distal left M2 occlusion.  He was brought to [facility] for NIR mechanical thrombectomy.  He did not receive tPA as he was deemed to be outside of the window.  Later that evening, he had agitation and restlessness. He received 25mg  Diphenhydramine at 1835 followed by 2mg  Haldol at 1841 and 1856, 3mg  Lorazepam at 1947.  He then became somnolent and later on had mild desaturations into the 80s on 3L [place].  His O2 was changed to NRB with improvement in sats to 100; however, mental status remained depressed and he was minimally responsive to noxious stimuli.  Per RN, prior to this he was awake and agitated, moving all extremities.  PCCM subsequently called and asked to see in consultation for concern of airway protection.  Per chart review, he carries no hx of seizures and he is not on any benzo's chronically.   Pertinent  Medical History:  has Complete tear of left rotator cuff; Essential hypertension; Impingement syndrome of left shoulder; Left hip pain; Left supraspinatus tenosynovitis; Pure hypercholesterolemia; Oral phase dysphagia; Esophageal dysphagia; Esophageal mass; AKI (acute kidney injury) ([place]); Hypernatremia; Cellulitis of left leg; Gastroesophageal reflux disease without esophagitis; Esophageal ulcer; Hypokalemia; AF (paroxysmal atrial fibrillation) ([place]); Cerebrovascular accident (CVA) ([place]); and Acute ischemic left MCA stroke ([place]) on their problem list.  Significant Hospital Events: Including procedures, antibiotic start and stop dates in addition to other pertinent events   12/5 Admit, to NIR for mechanical thrombectomy. Sedate  after benadryl, ativan, haldol.  Attempted benzo reversal with romazicon.  12/6 worse agitation -- started on precedex. Max'd on cleviprex.  12/7 off precedex. Decreasing restraint requirement   Interim History / Subjective:  Started on precedex yesterday for agitation Off precedex this morning   Objective:  Blood pressure (!) 161/68, pulse (!) 118, temperature 97.9 F (36.6 C), temperature source Axillary, resp. rate (!) 22, SpO2 95 %.        Intake/Output Summary (Last 24 hours) at 06/21/2021 0904 Last data filed at 06/21/2021 0600 Gross per 24 hour  Intake 1700.48 ml  Output 900 ml  Net 800.48 ml   There were no vitals filed for this visit.  Examination: General: Elderly frail M reclined in bed NAD  HEENT: NCAT. Anicteric sclera. Pink mm  Neuro: Awake alert. Oriented x 1. Interactive. Inconsistently following commands. Moving BUE BLE. Intermittently clear thought process.  CV: irir s1s2 cap refill < 3 sec  PULM: Slight expiratory wheeze. Symmetrical chest expansion, even unlabored  GI: soft ndnt + bowel sounds  GU: condom cath. Yellow urine  Extremities: c/d/w. BUE wrist restraints, mittens. BLE ankle restraints.  Skin: dry skin. Thin, friable appearing   Labs/imaging personally reviewed:  CTA head 12/5 > distal L M2 occlusion, 46ml penumbra.  Mod stenosis R M1 MCA, mod to marked stenosis distal L P2 PCA. Echo 12/6 > LVEF 45%, hypokinesis, mildly enlarged RV  mildly elevated RVSP (38) Moderately dilated RA severely dilated LA  CT / CTA 12/7>>   Assessment & Plan:    L M2 occlusion  S/p NIR mechanical thrombectomy P -post procedure care per  IR  -CT / CTA head instead of MRI   Acute metabolic encephalopathy superimposed on reported mild dementia Iatrogenic 2/2 polypharmacy in elderly pt (received 25mg  Diphenhydramine at 1835 followed by 2mg  Haldol at 1841 and 1856, 3mg  Lorazepam at 1947).  He has no hx of seizures and is not on benzos chronically. S/p romazicon for  reversal.  P -may transiently need precedex for neuroimaging -- if needed, stop when back in ICU (d/w primary team and RN)  -delirium precautions -decrease restraints as able -when taking POs, restart namenda   Hypoxia Probably multifactorial -- has been on IVF and confined to bed. Possible pulm edema as well as atelectasis. Passed swallow, aspiration less likely.  P -Supplemental O2 as needed -decrease mIVF and give lasix  -cont PT/OT efforts -Flutter valve w assistance may be helpful (I think pt may struggle with IS directions) -PRN albuterol neb   HTN Afib (not on AC) HLD  P -wean cleviprex for SBP goal 120-140 per NIR -PRN labetalol  -wean IVF   Best practice (evaluated daily):  Code Status: Full DVT ppx: lovenox GI ppx: n/a   CRITICAL CARE Performed by:   Total critical care time: 38 minutes  Critical care time was exclusive of separately billable procedures and treating other patients. Critical care was necessary to treat or prevent imminent or life-threatening deterioration.  Critical care was time spent personally by me on the following activities: development of treatment plan with patient and/or surrogate as well as nursing, discussions with consultants, evaluation of patient's response to treatment, examination of patient, obtaining history from patient or surrogate, ordering and performing treatments and interventions, ordering and review of laboratory studies, ordering and review of radiographic studies, pulse oximetry and re-evaluation of patient's condition.  MSN, AGACNP-BC [facility] Pulmonary/Critical Care Medicine Amion for pager  06/21/2021, 9:04 AM

## 2021-06-22 ENCOUNTER — Inpatient Hospital Stay (HOSPITAL_COMMUNITY): Payer: Medicare Other

## 2021-06-22 DIAGNOSIS — R4182 Altered mental status, unspecified: Secondary | ICD-10-CM

## 2021-06-22 DIAGNOSIS — I63512 Cerebral infarction due to unspecified occlusion or stenosis of left middle cerebral artery: Secondary | ICD-10-CM | POA: Diagnosis not present

## 2021-06-22 LAB — BASIC METABOLIC PANEL
Anion gap: 11 (ref 5–15)
BUN: 25 mg/dL — ABNORMAL HIGH (ref 8–23)
CO2: 19 mmol/L — ABNORMAL LOW (ref 22–32)
Calcium: 7.9 mg/dL — ABNORMAL LOW (ref 8.9–10.3)
Chloride: 109 mmol/L (ref 98–111)
Creatinine, Ser: 1.52 mg/dL — ABNORMAL HIGH (ref 0.61–1.24)
GFR, Estimated: 43 mL/min — ABNORMAL LOW (ref >60)
Glucose, Bld: 153 mg/dL — ABNORMAL HIGH (ref 70–99)
Potassium: 3.5 mmol/L (ref 3.5–5.1)
Sodium: 139 mmol/L (ref 135–145)

## 2021-06-22 LAB — CBC
HCT: 36.7 % — ABNORMAL LOW (ref 39.0–52.0)
Hemoglobin: 11.8 g/dL — ABNORMAL LOW (ref 13.0–17.0)
MCH: 29.8 pg (ref 26.0–34.0)
MCHC: 32.2 g/dL (ref 30.0–36.0)
MCV: 92.7 fL (ref 80.0–100.0)
Platelets: 159 10*3/uL (ref 150–400)
RBC: 3.96 MIL/uL — ABNORMAL LOW (ref 4.22–5.81)
RDW: 15.5 % (ref 11.5–15.5)
WBC: 12.2 10*3/uL — ABNORMAL HIGH (ref 4.0–10.5)
nRBC: 0 % (ref 0.0–0.2)

## 2021-06-22 LAB — SURGICAL PCR SCREEN
MRSA, PCR: NEGATIVE
Staphylococcus aureus: NEGATIVE

## 2021-06-22 LAB — HEPARIN LEVEL (UNFRACTIONATED)
Heparin Unfractionated: <0.1 IU/mL — ABNORMAL LOW (ref 0.30–0.70)
Heparin Unfractionated: 0.21 IU/mL — ABNORMAL LOW (ref 0.30–0.70)

## 2021-06-22 MED ORDER — METOPROLOL SUCCINATE ER 50 MG PO TB24
50.0000 mg | ORAL_TABLET | Freq: Every day | ORAL | Status: DC
  Administered 2021-06-22: 50 mg via ORAL
  Filled 2021-06-22 (×2): qty 1

## 2021-06-22 MED ORDER — POTASSIUM CHLORIDE 10 MEQ/100ML IV SOLN
10.0000 meq | INTRAVENOUS | Status: AC
  Administered 2021-06-22 (×4): 10 meq via INTRAVENOUS
  Filled 2021-06-22 (×4): qty 100

## 2021-06-22 MED ORDER — SODIUM CHLORIDE 0.9 % IV BOLUS
500.0000 mL | Freq: Once | INTRAVENOUS | Status: AC
Start: 2021-06-22 — End: 2021-06-22
  Administered 2021-06-22: 500 mL via INTRAVENOUS

## 2021-06-22 MED ORDER — HALOPERIDOL LACTATE 5 MG/ML IJ SOLN
2.5000 mg | Freq: Four times a day (QID) | INTRAMUSCULAR | Status: DC | PRN
  Administered 2021-06-23 – 2021-06-29 (×3): 2.5 mg via INTRAVENOUS
  Filled 2021-06-22 (×3): qty 1

## 2021-06-22 MED ORDER — MUPIROCIN 2 % EX OINT
1.0000 "application " | TOPICAL_OINTMENT | Freq: Two times a day (BID) | CUTANEOUS | Status: AC
  Administered 2021-06-22 – 2021-06-26 (×7): 1 via NASAL
  Filled 2021-06-22 (×2): qty 22

## 2021-06-22 MED ORDER — LORAZEPAM 2 MG/ML IJ SOLN
1.0000 mg | Freq: Once | INTRAMUSCULAR | Status: AC
  Administered 2021-06-22: 1 mg via INTRAVENOUS
  Filled 2021-06-22: qty 1

## 2021-06-22 MED ORDER — QUETIAPINE FUMARATE 50 MG PO TABS
25.0000 mg | ORAL_TABLET | Freq: Every day | ORAL | Status: DC
  Administered 2021-06-23 – 2021-06-29 (×7): 25 mg via ORAL
  Filled 2021-06-22 (×7): qty 1

## 2021-06-22 MED ORDER — SODIUM CHLORIDE 0.9 % IV SOLN: INTRAVENOUS | Status: AC

## 2021-06-22 MED ORDER — ENOXAPARIN SODIUM 80 MG/0.8ML IJ SOSY
65.0000 mg | PREFILLED_SYRINGE | INTRAMUSCULAR | Status: DC
  Administered 2021-06-22 – 2021-06-23 (×2): 65 mg via SUBCUTANEOUS
  Filled 2021-06-22 (×2): qty 0.8

## 2021-06-22 NOTE — Evaluation (Signed)
Physical Therapy Evaluation Patient Details Name: Devon Elliott. MRN: [id number] DOB: [birth year] Today's Date: 06/22/2021  History of Present Illness  87 yo male admitted 12/5 with Lt MCA CVA s/p thrombectomy. Pt with agitation/restlessness post op. PMhx: dementia, HLD, HTN, dysphagia, AKI, Afib  Clinical Impression  Pt on 15L via partial NRB throughout session with sats 98% with frequent cues to maintain mask on. Pt restless, fidgeting, internally distracted and HOH without hearing aids present. Pt with decreased cognition, balance, transfers, gait and safety who will benefit from acute therapy to maximize mobility, safety and independence to decrease burden of care. Pt lives with wife and has an aide 3days/week who per son can start coming daily. Spoke to son on phone who agreed to be able to provide assist at D/C as wife is unable to physically assist.   BP 160/82 HR 78       Recommendations for follow up therapy are one component of a multi-disciplinary discharge planning process, led by the attending physician.  Recommendations may be updated based on patient status, additional functional criteria and insurance authorization.  Follow Up Recommendations Acute inpatient rehab (3hours/day)    Assistance Recommended at Discharge Frequent or constant Supervision/Assistance  Functional Status Assessment Patient has had a recent decline in their functional status and demonstrates the ability to make significant improvements in function in a reasonable and predictable amount of time.  Equipment Recommendations  BSC/3in1    Recommendations for Other Services       Precautions / Restrictions Precautions Precautions: Fall;Other (comment) Precaution Comments: SBP<200      Mobility  Bed Mobility Overal bed mobility: Needs Assistance Bed Mobility: Supine to Sit     Supine to sit: Mod assist;+2 for safety/equipment     General bed mobility comments: physical assist to pivot to  left side of bed and elevate trunk, increased time and cues to attend to task due to fidgeting    Transfers Overall transfer level: Needs assistance   Transfers: Sit to/from Stand;Bed to chair/wheelchair/BSC Sit to Stand: Min assist;+2 safety/equipment;+2 physical assistance   Step pivot transfers: Min assist;+2 safety/equipment       General transfer comment: min +2 assist to rise from surface and pivot to chair with short shuffling steps. Initially with ataxic movement RLE however on repeat trial of 2'forward and back from chair with bil HHA pt with shuffling gait and posterior lean without clearly ataxic movement    Ambulation/Gait                  Stairs            Wheelchair Mobility    Modified Rankin (Stroke Patients Only)       Balance Overall balance assessment: Needs assistance   Sitting balance-Leahy Scale: Poor Sitting balance - Comments: min assist for sitting balance with posterior lean     Standing balance-Leahy Scale: Poor Standing balance comment: bil UE support with min assist                             Pertinent Vitals/Pain Pain Assessment: PAINAD Breathing: normal Negative Vocalization: none Facial Expression: smiling or inexpressive Body Language: relaxed Consolability: no need to console PAINAD Score: 0 Facial Expression: Relaxed, neutral Body Movements: Absence of movements Muscle Tension: Relaxed Compliance with ventilator (intubated pts.): N/A Vocalization (extubated pts.): Talking in normal tone or no sound CPOT Total: 0    Home Living Family/patient expects to be  discharged to:: Private residence Living Arrangements: Spouse/significant other Available Help at Discharge: Available PRN/intermittently;Personal care attendant Type of Home: House Home Access: Ramped entrance       Home Layout: One level Home Equipment: Agricultural consultant (2 wheels);Other (comment);Cane - single point;Shower seat (lift  chair) Additional Comments: aide 3 days, 10-5 really as supervision and to take him out in the community. Pt has hearing aids and per son has always been very energetic and on the go. wife uses a RW and will not be able to physically assist.    Prior Function Prior Level of Function : Independent/Modified Independent             Mobility Comments: walks with RW       Hand Dominance        Extremity/Trunk Assessment   Upper Extremity Assessment Upper Extremity Assessment: Defer to OT evaluation    Lower Extremity Assessment Lower Extremity Assessment: Generalized weakness;Difficult to assess due to impaired cognition (pt able to perform bil LAQ but unable to formally assess)    Cervical / Trunk Assessment Cervical / Trunk Assessment: Normal  Communication   Communication: HOH  Cognition Arousal/Alertness: Awake/alert Behavior During Therapy: Restless Overall Cognitive Status: Impaired/Different from baseline Area of Impairment: Orientation;Following commands;Safety/judgement;Attention;Memory                 Orientation Level: Disoriented to;Time;Situation;Place Current Attention Level: Focused Memory: Decreased short-term memory Following Commands: Follows one step commands inconsistently Safety/Judgement: Decreased awareness of safety;Decreased awareness of deficits     General Comments: pt restless, internally distracted and very fidgety with all linens and environment. pt oriented to self and very HOH without hearing aids so difficult to discern cognition with [facility]        General Comments      Exercises     Assessment/Plan    PT Assessment    PT Problem List         PT Treatment Interventions      PT Goals (Current goals can be found in the Care Plan section)  Acute Rehab PT Goals Patient Stated Goal: return home PT Goal Formulation: With patient/family Time For Goal Achievement: 07/06/21 Potential to Achieve Goals: Fair    Frequency      Barriers to discharge        Co-evaluation PT/OT/SLP Co-Evaluation/Treatment: Yes Reason for Co-Treatment: Complexity of the patient's impairments (multi-system involvement);For patient/therapist safety PT goals addressed during session: Mobility/safety with mobility;Balance         AM-PAC PT "6 Clicks" Mobility  Outcome Measure Help needed turning from your back to your side while in a flat bed without using bedrails?: A Little Help needed moving from lying on your back to sitting on the side of a flat bed without using bedrails?: A Lot Help needed moving to and from a bed to a chair (including a wheelchair)?: A Lot Help needed standing up from a chair using your arms (e.g., wheelchair or bedside chair)?: A Lot Help needed to walk in hospital room?: Total Help needed climbing 3-5 steps with a railing? : Total 6 Click Score: 11    End of Session Equipment Utilized During Treatment: Gait belt Activity Tolerance: Patient tolerated treatment well Patient left: in chair;with call bell/phone within reach;with chair alarm set Nurse Communication: Mobility status;Precautions (+2 stand pivot) PT Visit Diagnosis: Unsteadiness on feet (R26.81);Other abnormalities of gait and mobility (R26.89);Difficulty in walking, not elsewhere classified (R26.2);Other symptoms and signs involving the nervous system (R29.898)    Time:  [id number] PT Time Calculation (min) (ACUTE ONLY): 24 min   Charges:   PT Evaluation $PT Eval Moderate Complexity: 1 Mod          Marlaine Arey P, PT Acute Rehabilitation Services Pager: [phone] Office: [phone]   Sandy Salaam Shanae Luo 06/22/2021, 12:30 PM

## 2021-06-22 NOTE — Progress Notes (Addendum)
SLP Cancellation Note  Patient Details Name: Devon Elliott. MRN: [id number] DOB: [birth year]   Cancelled treatment:       Reason Eval/Treat Not Completed: Patient's level of consciousness  Attempted to see pt to complete cognitive linguistic evaluation. 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 RN reports pt had Ativan earlier. He is currently insufficiently arousable for evaluation. Will continue efforts.   Lakai Moree B. Murvin Natal, [facility], CCC-SLP Speech Language Pathologist Office: [phone]  Leigh Aurora 06/22/2021, 1:24 PM

## 2021-06-22 NOTE — Progress Notes (Signed)
STROKE TEAM PROGRESS NOTE   INTERVAL HISTORY Patient is seen in his room with his RN at the bedside.  He remains delirious, confused and now is off the Precedex drip.  He is not very cooperative for taking his medications consistently needs a lot of cueing.  CT scan of the head done yesterday did not show any acute abnormality.  Patient was noncooperative from MRI.  EEG shows cortical dysfunction of the right hemisphere with mild diffuse encephalopathy.  No definite seizures.  Therapist recommend inpatient rehab.  Critical care team consulted palliative care yesterday spoke to the son who wanted to continue full scope of care for now.  However daughter-in-law when spoken to today by critical care nurse practitioner said that she would discuss possible comfort care with her husband..  No family at the bedside.  Vital signs stable blood pressure remains high Vitals:   06/22/21 1405 06/22/21 1406 06/22/21 1408 06/22/21 1500  BP: (!) 240/210 (!) 264/218 (!) 155/99 (!) 128/115  Pulse:    99  Resp:    (!) 25  Temp:      TempSrc:      SpO2:    99%  Height:       CBC:  Recent Labs  Lab 06/19/21 1207 06/19/21 1710 06/21/21 0323 06/22/21 0326  WBC 6.7   < > 11.1* 12.2*  NEUTROABS 5.1  --   --   --   HGB 12.7*   < > 11.2* 11.8*  HCT 39.1   < > 35.3* 36.7*  MCV 94.7   < > 94.4 92.7  PLT 172   < > 161 159   < > = values in this interval not displayed.   Basic Metabolic Panel:  Recent Labs  Lab 06/21/21 0323 06/22/21 0326  NA 142 139  K 4.0 3.5  CL 112* 109  CO2 21* 19*  GLUCOSE 106* 153*  BUN 20 25*  CREATININE 1.20 1.52*  CALCIUM 7.7* 7.9*   Lipid Panel:  Recent Labs  Lab 06/20/21 0359  CHOL 198  TRIG 44  HDL 79  CHOLHDL 2.5  VLDL 9  LDLCALC 110*   HgbA1c:  Recent Labs  Lab 06/20/21 0359  HGBA1C 5.3   Urine Drug Screen:  Recent Labs  Lab 06/19/21 1315  LABOPIA NONE DETECTED  COCAINSCRNUR NONE DETECTED  LABBENZ POSITIVE*  AMPHETMU NONE DETECTED  THCU NONE  DETECTED  LABBARB NONE DETECTED    Alcohol Level  Recent Labs  Lab 06/19/21 1207  Reading <10    IMAGING past 24 hours EEG adult  Result Date: 06/22/2021 Lora Havens, MD     06/22/2021 12:50 PM Patient Name: Devon Elliott. MRN: [id number] Epilepsy Attending: Lora Havens Referring Physician/Provider: Katy Apo, NP Date: 06/22/2021 Duration: 23.08 mins Patient history: 87 year old male with left M2 occlusion status post thrombectomy now with altered mental status.  EEG to evaluate for seizure. Level of alertness: Awake AEDs during EEG study: None Technical aspects: This EEG study was done with scalp electrodes positioned according to the 10-20 International system of electrode placement. Electrical activity was acquired at a sampling rate of 500Hz  and reviewed with a high frequency filter of 70Hz  and a low frequency filter of 1Hz . EEG data were recorded continuously and digitally stored. Description: The posterior dominant rhythm consists of 8 Hz activity of moderate voltage (25-35 uV) seen predominantly in posterior head regions, symmetric and reactive to eye opening and eye closing.  EEG showed intermittent generalized  and lateralized right hemisphere 3 to 6 Hz theta-delta slowing. Hyperventilation and photic stimulation were not performed.   ABNORMALITY - Intermittent slow, generalized and lateralized right hemisphere IMPRESSION: This study is suggestive of cortical dysfunction in right hemisphere as well as mild diffuse encephalopathy, nonspecific etiology.  No seizures or epileptiform discharges were seen throughout the recording. [place]    PHYSICAL EXAM General:  Patient is a confused, alert male in no acute distress. . Afebrile. Head is nontraumatic. Neck is supple without bruit.    Cardiac exam no murmur or gallop. Lungs are clear to auscultation. Distal pulses are well felt.   Neurological Exam :   Patient is oriented to name only.  He will respond to  questions, but answers are not always appropriate.  He is confused and delirious.  Diminished attention, registration and recall.  Follows simple midline and one-step commands only.  He is able to move all extremities purposefully in response to commands. EOMI, no facial droop.  No focal weakness.  ASSESSMENT/PLAN [name]. is a 87 y.o. male with history of HTN, atrial fibrillation not on anticoagulation and mild dementia presenting with aphasia and right sided weakness. He was found to have an acute left MCA M2 occlusion with a large mismatch on CT perfusion.  He was taken to IR for mechanical thrombectomy. TICI 3 flow was achieved. Overnight, he has required Cleviprex for BP control and had an episode of agitation requiring sedation with ativan.  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  Patient has delirium likely due to sundowning from mild cognitive impairment/dementia at baseline Code Stroke CT head No acute abnormality. Chronic microvascular disease. ASPECTS 10.    CTA head & neck Less than 50% stenosis of right and left carotid arteries, distal left M2 MCA occlusion CT perfusion 90 mL of penumbra in left MCA territory Cerebral angio s/p mechanical thrombectomy of left M2 MCA Post IR CT no ICH/SAH MRI not done due to patient's agitation 2D Echo ejection fraction 45%.  Global hypokinesis.  Dilated left atrium.  No clot  LDL 110 HgbA1c 5.3 VTE prophylaxis - lovenox    Diet   Diet regular Room service appropriate? Yes with Assist; Fluid consistency: Thin   No antithrombotic prior to admission, now on aspirin 81 mg daily.  Therapy recommendations:  pending Disposition:  pending  Hypertension Home meds:  metoprolol 25 mg daily Unstable Requiring Cleviprex Kepp SBP 120-140 post thrombectomy Long-term BP goal  normotensive  Hyperlipidemia Home meds:  none LDL 110, goal < 70 Add Atorvastatin 40 mg  High intensity statin initiated Continue statin at discharge  Atrial fibrillation Will likely need anticoagulation with a DOAC  Other Stroke Risk Factors Advanced Age >/= 25   Other Active Problems Delirium/mild dementia Reorient PRN Delirium precautions  Hospital day # 3  Patient continues to be delirious and confused now off Precedex but still requiring restraints and not consistently taking his medicines.  Patient encouraged to take p.o. diet and medications and increase Seroquel dose to 25 mg twice daily for for agitation .  Wean restraints as tolerated.  Mobilize out of bed.  Therapy consults.  Continue IV heparin drip for anticoagulation.  For his A. fib.  Transferred to neurology floor bed and will need to consult medical hospitalist team for pickup when on the floor long discussion with patient and son at  the bedside and answered questions.  Discussed with Dr. Lynnell Catalan critical care medicine.   This patient is critically ill and at significant risk of neurological worsening, death and care requires constant monitoring of vital signs, hemodynamics,respiratory and cardiac monitoring, extensive review of multiple databases, frequent neurological assessment, discussion with family, other specialists and medical decision making of high complexity.I have made any additions or clarifications directly to the above note.This critical care time does not reflect procedure time, or teaching time or supervisory time of PA/NP/Med Resident etc but could involve care discussion time.  I spent 30 minutes of neurocritical care time  in the care of  this patient.       Delia Heady, MD Medical Director [facility] Pager: [phone] 06/22/2021 3:19 PM   To contact Stroke Continuity provider, please refer to [email or web address]. After hours, contact General Neurology.

## 2021-06-22 NOTE — Evaluation (Signed)
Occupational Therapy Evaluation Patient Details Name: Devon Elliott. MRN: [id number] DOB: 03/06/1927 Today's Date: 06/22/2021   History of Present Illness 87 yo male admitted 12/5 with Lt MCA CVA s/p thrombectomy. Pt with agitation/restlessness post op. PMhx: dementia, HLD, HTN, dysphagia, AKI, Afib   Clinical Impression   Patient admitted for the diagnosis above.  PTA, per conversation with spouse via PT, the patient lives with his spouse, has a PCA 3x/wk, and has a son that can assist as needed.  He walked with a RW, and needed generalized supervision for showers.  Deficits impacting independence are listed below.  Currently he is needing up to total assist for lower body ADL, and up to +2 for basic mobility.  OT to follow in the acute setting, but AIR has been recommended post acute.  Given who active he was at home, the patient would benefit from a more aggressive multi disciplined approach to post acute rehab prior to returning home.        Recommendations for follow up therapy are one component of a multi-disciplinary discharge planning process, led by the attending physician.  Recommendations may be updated based on patient status, additional functional criteria and insurance authorization.   Follow Up Recommendations  Acute inpatient rehab (3hours/day)    Assistance Recommended at Discharge Frequent or constant Supervision/Assistance  Functional Status Assessment  Patient has had a recent decline in their functional status and demonstrates the ability to make significant improvements in function in a reasonable and predictable amount of time.  Equipment Recommendations  BSC/3in1;Tub/shower seat    Recommendations for Other Services       Precautions / Restrictions Precautions Precautions: Fall;Other (comment) Precaution Comments: SBP<200 Restrictions Weight Bearing Restrictions: No  Waist restraint B mittens     Mobility Bed Mobility Overal bed mobility: Needs  Assistance Bed Mobility: Supine to Sit     Supine to sit: Mod assist;+2 for safety/equipment     General bed mobility comments: physical assist to pivot to left side of bed and elevate trunk, increased time and cues to attend to task due to fidgeting    Transfers Overall transfer level: Needs assistance   Transfers: Sit to/from Stand;Bed to chair/wheelchair/BSC Sit to Stand: Min assist;+2 safety/equipment;+2 physical assistance     Step pivot transfers: Min assist;+2 safety/equipment        Balance Overall balance assessment: Needs assistance Sitting-balance support: Feet supported Sitting balance-Leahy Scale: Poor Sitting balance - Comments: min assist for sitting balance with posterior lean Postural control: Posterior lean;Left lateral lean Standing balance support: Bilateral upper extremity supported Standing balance-Leahy Scale: Poor Standing balance comment: bil UE support with min assist                           ADL either performed or assessed with clinical judgement   ADL Overall ADL's : Needs assistance/impaired     Grooming: Wash/dry hands;Wash/dry face;Minimal assistance;Sitting   Upper Body Bathing: Moderate assistance;Sitting   Lower Body Bathing: Maximal assistance;Sitting/lateral leans   Upper Body Dressing : Moderate assistance;Sitting   Lower Body Dressing: Total assistance;Sitting/lateral leans   Toilet Transfer: Moderate assistance;+2 for physical assistance   Toileting- Clothing Manipulation and Hygiene: Maximal assistance;Sitting/lateral lean               Vision Patient Visual Report: No change from baseline       Perception Perception Perception: Not tested   Praxis Praxis Praxis: Not tested    Pertinent Vitals/Pain  Hand Dominance Left   Extremity/Trunk Assessment Upper Extremity Assessment Upper Extremity Assessment: Generalized weakness   Lower Extremity Assessment Lower Extremity Assessment: Defer  to PT evaluation   Cervical / Trunk Assessment Cervical / Trunk Assessment: Normal   Communication Communication Communication: HOH   Cognition Arousal/Alertness: Awake/alert Behavior During Therapy: Restless Overall Cognitive Status: Impaired/Different from baseline Area of Impairment: Orientation;Following commands;Safety/judgement;Attention;Memory                 Orientation Level: Disoriented to;Time;Situation;Place Current Attention Level: Focused Memory: Decreased short-term memory Following Commands: Follows one step commands inconsistently Safety/Judgement: Decreased awareness of safety;Decreased awareness of deficits          General Comments   Easily distracted    Exercises     Shoulder Instructions      Home Living Family/patient expects to be discharged to:: Private residence Living Arrangements: Spouse/significant other Available Help at Discharge: Available PRN/intermittently;Personal care attendant Type of Home: House Home Access: Ramped entrance     Home Layout: One level     Bathroom Shower/Tub: Producer, television/film/video: Standard Bathroom Accessibility: Yes How Accessible: Accessible via walker Home Equipment: Rolling Walker (2 wheels);Other (comment);Cane - single point;Shower seat   Additional Comments: Per PT called spouse:  aide 3 days, 10-5 really as supervision and to take him out in the community. Pt has hearing aids and per son has always been very energetic and on the go. wife uses a RW and will not be able to physically assist.      Prior Functioning/Environment Prior Level of Function : Independent/Modified Independent             Mobility Comments: walks with RW ADLs Comments: Supervision for ADL/IADL        OT Problem List: Decreased strength;Decreased activity tolerance;Impaired balance (sitting and/or standing);Decreased knowledge of use of DME or AE;Decreased safety awareness;Decreased cognition       OT Treatment/Interventions: Self-care/ADL training;Therapeutic exercise;Therapeutic activities;Cognitive remediation/compensation;DME and/or AE instruction;Patient/family education;Balance training    OT Goals(Current goals can be found in the care plan section) Acute Rehab OT Goals OT Goal Formulation: Patient unable to participate in goal setting Time For Goal Achievement: 07/06/21 Potential to Achieve Goals: Good ADL Goals Pt Will Perform Grooming: with supervision;sitting Pt Will Perform Upper Body Bathing: with supervision;sitting Pt Will Perform Upper Body Dressing: with supervision;sitting Pt Will Transfer to Toilet: with min assist;ambulating;regular height toilet;bedside commode Pt Will Perform Toileting - Clothing Manipulation and hygiene: with min guard assist;sitting/lateral leans Pt/caregiver will Perform Home Exercise Program: Increased strength;Both right and left upper extremity;With theraband;With minimal assist;With written HEP provided  OT Frequency: Min 2X/week   Barriers to D/C: Decreased caregiver support          Co-evaluation PT/OT/SLP Co-Evaluation/Treatment: Yes Reason for Co-Treatment: Complexity of the patient's impairments (multi-system involvement);For patient/therapist safety PT goals addressed during session: Mobility/safety with mobility;Balance OT goals addressed during session: ADL's and self-care      AM-PAC OT "6 Clicks" Daily Activity     Outcome Measure Help from another person eating meals?: A Lot Help from another person taking care of personal grooming?: A Lot Help from another person toileting, which includes using toliet, bedpan, or urinal?: A Lot Help from another person bathing (including washing, rinsing, drying)?: A Lot Help from another person to put on and taking off regular upper body clothing?: A Lot Help from another person to put on and taking off regular lower body clothing?: Total 6 Click Score: 11  End of Session  Equipment Utilized During Treatment: Gait belt;Oxygen  Activity Tolerance: Patient limited by fatigue Patient left: in chair;with call bell/phone within reach;with chair alarm set  OT Visit Diagnosis: Unsteadiness on feet (R26.81);Other abnormalities of gait and mobility (R26.89);Muscle weakness (generalized) (M62.81);Other symptoms and signs involving cognitive function                Time: 1007-1030 OT Time Calculation (min): 23 min Charges:  OT General Charges $OT Visit: 1 Visit OT Evaluation $OT Eval Moderate Complexity: 1 Mod  06/22/2021  RP, OTR/L  Acute Rehabilitation Services  Office:  [phone]   Suzanna Obey 06/22/2021, 1:39 PM

## 2021-06-22 NOTE — Progress Notes (Signed)
  Inpatient Rehab Admissions Coordinator :  Per therapy recommendations patient was screened for CIR candidacy by Sarkis Rhines RN MSN. Patient is not yet at a level to tolerate the intensity required to pursue a CIR . Patient may have the potential to progress to become a candidate. The CIR admissions team will follow and monitor for progress and place a Rehab Consult order if felt to be appropriate. Please contact me with any questions.  Darcelle Herrada RN MSN Admissions Coordinator [phone]  

## 2021-06-22 NOTE — Progress Notes (Signed)
EEG complete - results pending 

## 2021-06-22 NOTE — Progress Notes (Signed)
Patient transferred to 4NP05 with all belongings. Contacted son, Onalee Hua, to inform of room change.

## 2021-06-22 NOTE — Progress Notes (Signed)
NAME:  Devon Elliott., MRN:  [id number], DOB:  April 19, 1927, LOS: 3 ADMISSION DATE:  06/19/2021, CONSULTATION DATE:  06/19/21 REFERRING MD:  Roda Shutters CHIEF COMPLAINT:  AMS   History of Present Illness:  87 y/o M who has a PMH as outlined below including but not limited to HTN, HLD, AF not on AC.  He presented to [facility] 12/5 with aphasia and right sided weakness. CTA showed distal left M2 occlusion.  He was brought to [facility] for NIR mechanical thrombectomy.  He did not receive tPA as he was deemed to be outside of the window.  Later that evening, he had agitation and restlessness. He received 25mg  Diphenhydramine at 1835 followed by 2mg  Haldol at 1841 and 1856, 3mg  Lorazepam at 1947.  He then became somnolent and later on had mild desaturations into the 80s on 3L [place].  His O2 was changed to NRB with improvement in sats to 100; however, mental status remained depressed and he was minimally responsive to noxious stimuli.  Per RN, prior to this he was awake and agitated, moving all extremities.  PCCM subsequently called and asked to see in consultation for concern of airway protection.  Per chart review, he carries no hx of seizures and he is not on any benzo's chronically.   Pertinent  Medical History:  has Complete tear of left rotator cuff; Essential hypertension; Impingement syndrome of left shoulder; Left hip pain; Left supraspinatus tenosynovitis; Pure hypercholesterolemia; Oral phase dysphagia; Esophageal dysphagia; Esophageal mass; AKI (acute kidney injury) (HCC); Hypernatremia; Cellulitis of left leg; Gastroesophageal reflux disease without esophagitis; Esophageal ulcer; Hypokalemia; AF (paroxysmal atrial fibrillation) (HCC); Cerebrovascular accident (CVA) (HCC); and Acute ischemic left MCA stroke (HCC) on their problem list.  Significant Hospital Events: Including procedures, antibiotic start and stop dates in addition to other pertinent events   12/5 Admit, to NIR for mechanical thrombectomy. Sedate  after benadryl, ativan, haldol.  Attempted benzo reversal with romazicon.  12/6 worse agitation -- started on precedex. Max'd on cleviprex.  12/7 off precedex. Decreasing restraint requirement   Interim History / Subjective:  Remained on cleviprex overnight Cr bump this morning   Feels like he might have a BM this morning  Objective:  Blood pressure (!) 160/82, pulse 89, temperature 99.1 F (37.3 C), temperature source Axillary, resp. rate 18, height 5\' 9"  (1.753 m), SpO2 100 %.    FiO2 (%):  [100 %] 100 %   Intake/Output Summary (Last 24 hours) at 06/22/2021 1122 Last data filed at 06/22/2021 1000 Gross per 24 hour  Intake 1187.94 ml  Output 1525 ml  Net -337.06 ml   There were no vitals filed for this visit.  Examination: General: elderly frail appearing M, reclined in bed NAD  HEENT: NCAT pink mm anicteric sclera  Neuro: AAOx2 following commands. Hard of hearing. Confused  CV: irir s1s2 no rgm cap refill brisk  PULM: CTA. No accessory use.  GI: soft ndnt + bowel sounds  GU: condom cath  Extremities: c/d/w. BUE wrist restraints, mittens. BLE ankle restraints.  Skin: dry skin. Thin, friable appearing   Labs/imaging personally reviewed:  CTA head 12/5 > distal L M2 occlusion, 65ml penumbra.  Mod stenosis R M1 MCA, mod to marked stenosis distal L P2 PCA. Echo 12/6 > LVEF 45%, hypokinesis, mildly enlarged RV  mildly elevated RVSP (38) Moderately dilated RA severely dilated LA  CT / CTA 12/7> patent revascularized L M2. No bleed.   Assessment & Plan:   L M2 occlusion s/p mechanical thrombectomy  S/p NIR mechanical thrombectomy Follow up CTA 12/7 with no bleed, no new occlusion, patent revascularized L M2  P - per CVA service   Acute metabolic encephalopathy with hyperactive delirium superimposed on reportedly mild dementia  Iatrogenic 2/2 polypharmacy in elderly pt (received 25mg  Diphenhydramine at 1835 followed by 2mg  Haldol at 1841 and 1856, 3mg  Lorazepam at 1947).  He  has no hx of seizures and is not on benzos chronically. S/p romazicon for reversal.  P -Delirium precautions. Lights on, TV on, mobility during the day -Sleep hygiene at night, cluster care -decrease restraints  -restarting namenda this morning -QHS seroquel + PRN. Room to increase QHS dose depending on qtc   Acute hypoxic respiratory failure  Probably multifactorial -- has been on IVF and confined to bed. Possible pulm edema as well as atelectasis. Passed swallow, aspiration less likely.  P -cont NRB ( hasn't tolerated [place]-- pulls it off, mouth breather) -PT/OT, mobility  -PRN neb  -IS, flutter  HTN Afib HLD  HFrEF P -SBP goal further liberalized per neuro -increasing metop to 50mg  from 25 -please wean cleiviprex. PRN labetalol is ordered -atorvastatin -has been started on a heparin gtt per stroke service for Afib. Big picture I do worry about starting chronic anticoagulation. He uses a walker at baseline and is likely a higher fall risk. Rec careful consideration of possible AC depending on how his mentation progresses, how supportive physical therapies progress.   AKI  -Cr from 1.2 to 1.52. Possible Cr bump related to diuresis  P -trend renal indices UOP  -hold further diuresis for now -hold on incr IVF -- pt starting to take more POs which is preferable.   Best practice (evaluated daily):  Code Status: Full DVT ppx: heparin gtt GI ppx: n/a  Dispo: Primary service has asked that PCCM request [facility] to take over as primary starting 12/9 if able to come off of cleviprex, transfer out of ICU   CRITICAL CARE Performed by: Cristal Generous   Total critical care time: 36 minutes  Critical care time was exclusive of separately billable procedures and treating other patients. Critical care was necessary to treat or prevent imminent or life-threatening deterioration.  Critical care was time spent personally by me on the following activities: development of treatment plan with  patient and/or surrogate as well as nursing, discussions with consultants, evaluation of patient's response to treatment, examination of patient, obtaining history from patient or surrogate, ordering and performing treatments and interventions, ordering and review of laboratory studies, ordering and review of radiographic studies, pulse oximetry and re-evaluation of patient's condition.  Eliseo Gum MSN, AGACNP-BC [place] for pager  06/22/2021, 11:22 AM

## 2021-06-22 NOTE — Progress Notes (Signed)
K+ 3.5  Replaced per protocol  

## 2021-06-22 NOTE — Progress Notes (Signed)
ANTICOAGULATION CONSULT NOTE - Follow Up Consult  Pharmacy Consult for heparin Indication:  Afib in setting of acute CVA  Labs: Recent Labs    06/19/21 1207 06/19/21 1710 06/21/21 0323 06/22/21 0326  HGB 12.7* 12.2* 11.2* 11.8*  HCT 39.1 37.0* 35.3* 36.7*  PLT 172 167 161 159  APTT 30  --   --   --   LABPROT 13.3  --   --   --   INR 1.0  --   --   --   HEPARINUNFRC  --   --   --  <0.10*  CREATININE 1.12 1.22 1.20  --     Assessment: 87yo male subtherapeutic on heparin with initial dosing for Afib; no infusion issues or signs of bleeding per RN.  Goal of Therapy:  Heparin level 0.3-0.5 units/ml   Plan:  Will increase heparin infusion conservatively by 2-3 units/kg/hr to 1000 units/hr and check level in 8 hours.    Vernard Gambles, PharmD, BCPS  06/22/2021,4:09 AM

## 2021-06-22 NOTE — Progress Notes (Signed)
Pt without spontaneous void throughout shift. Completed straight cath with return of 500cc of bloody urine with clots. Catheter required manipulation to maintain good flow. Upon removal of straight cath, tip contained many clots.  Alerted Stroke team of concerns. Recommended to I&O overnight as needed and re-evaluate in AM.

## 2021-06-22 NOTE — Progress Notes (Signed)
Per Dr. Pearlean Brownie, SBP <200 OK.

## 2021-06-22 NOTE — Progress Notes (Signed)
ANTICOAGULATION CONSULT NOTE   Pharmacy Consult for IV Heparin>> Lovenox per CCM Indication: atrial fibrillation  Allergies  Allergen Reactions   Ace Inhibitors     Other reaction(s): Kidney Disorder   Clarithromycin Hives   Etodolac     Other reaction(s): Kidney Disorder   Levofloxacin Swelling    Patient Measurements: Height: 5\' 9"  (175.3 cm) (From 04/2020) IBW/kg (Calculated) : 70.7 Heparin Dosing Weight: 67.2 kg (actual)  Vital Signs: Temp: 97.6 F (36.4 C) (12/08 1200) Temp Source: Axillary (12/08 1200) BP: 220/167 (12/08 1200) Pulse Rate: 86 (12/08 1200)  Labs: Recent Labs    06/19/21 1710 06/21/21 0323 06/22/21 0326 06/22/21 1140  HGB 12.2* 11.2* 11.8*  --   HCT 37.0* 35.3* 36.7*  --   PLT 167 161 159  --   HEPARINUNFRC  --   --  <0.10* 0.21*  CREATININE 1.22 1.20 1.52*  --     Estimated Creatinine Clearance: 29.5 mL/min (A) (by C-G formula based on SCr of 1.52 mg/dL (H)).   Medical History: Past Medical History:  Diagnosis Date   Complete tear of left rotator cuff 07/21/2014   Essential hypertension 04/19/2014   Impingement syndrome of left shoulder 07/21/2014   Left hip pain 02/08/2016   Left supraspinatus tenosynovitis 07/21/2014   Pure hypercholesterolemia 04/19/2014    Medications:  Scheduled:   aspirin  81 mg Oral Daily   atorvastatin  40 mg Oral Daily   Chlorhexidine Gluconate Cloth  6 each Topical Daily   memantine  5 mg Oral q morning   metoprolol succinate  50 mg Oral Daily   QUEtiapine  25 mg Oral QHS   [START ON 06/23/2021] QUEtiapine  25 mg Oral Q0600    Assessment: 87 years of age male who presented with 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 Pharmacy consulted to transition IV Heparin to Lovenox due to difficulty with obtaining labs.   Post-IR CT head - no bleed seen.  CBC stable. Platelets within normal limit.   Bruising noted on arms but no overt bleeding.  SCR bumped today likely due to diuresis -- urine output maintaining.  Goal of Therapy:  Anti-Xa level 0.6-1 units/ml 4hrs after LMWH dose given as appropriate Monitor platelets by anticoagulation protocol: Yes   Plan:  Discontinue Heparin.  In 1 hour after Heparin is discontinued, start Lovenox 65mg  SQ every 24 hours (CrCl <30) Monitor renal function and adjust as needed.  Monitor CBC and for signs and symptoms of bleeding.   99, PharmD, BCPS, BCCCP Clinical Pharmacist Please refer to [facility] for [facility] Pharmacy numbers 06/22/2021,1:07 PM

## 2021-06-22 NOTE — Procedures (Signed)
Patient Name: Devon Elliott.  MRN: [id number]  Epilepsy Attending: Charlsie Quest  Referring Physician/Provider: Marjorie Smolder, NP Date: 06/22/2021 Duration: 23.08 mins  Patient history: 87 year old male with left M2 occlusion status post thrombectomy now with altered mental status.  EEG to evaluate for seizure.  Level of alertness: Awake   AEDs during EEG study: None  Technical aspects: This EEG study was done with scalp electrodes positioned according to the 10-20 International system of electrode placement. Electrical activity was acquired at a sampling rate of 500Hz  and reviewed with a high frequency filter of 70Hz  and a low frequency filter of 1Hz . EEG data were recorded continuously and digitally stored.   Description: The posterior dominant rhythm consists of 8 Hz activity of moderate voltage (25-35 uV) seen predominantly in posterior head regions, symmetric and reactive to eye opening and eye closing.  EEG showed intermittent generalized and lateralized right hemisphere 3 to 6 Hz theta-delta slowing. Hyperventilation and photic stimulation were not performed.     ABNORMALITY - Intermittent slow, generalized and lateralized right hemisphere  IMPRESSION: This study is suggestive of cortical dysfunction in right hemisphere as well as mild diffuse encephalopathy, nonspecific etiology.  No seizures or epileptiform discharges were seen throughout the recording.  Jakob Kimberlin 

## 2021-06-22 NOTE — Progress Notes (Signed)
Pt moved into 4NP-05 from 4NICU. Pt with one bag of belongings. Pt responds to voice. Disoriented x 4. Full assessment and vital signs documented.

## 2021-06-23 LAB — CBC
HCT: 31.4 % — ABNORMAL LOW (ref 39.0–52.0)
Hemoglobin: 10.2 g/dL — ABNORMAL LOW (ref 13.0–17.0)
MCH: 29.9 pg (ref 26.0–34.0)
MCHC: 32.5 g/dL (ref 30.0–36.0)
MCV: 92.1 fL (ref 80.0–100.0)
Platelets: 126 10*3/uL — ABNORMAL LOW (ref 150–400)
RBC: 3.41 MIL/uL — ABNORMAL LOW (ref 4.22–5.81)
RDW: 15.7 % — ABNORMAL HIGH (ref 11.5–15.5)
WBC: 7.4 10*3/uL (ref 4.0–10.5)
nRBC: 0 % (ref 0.0–0.2)

## 2021-06-23 LAB — BASIC METABOLIC PANEL
Anion gap: 7 (ref 5–15)
BUN: 33 mg/dL — ABNORMAL HIGH (ref 8–23)
CO2: 22 mmol/L (ref 22–32)
Calcium: 7.5 mg/dL — ABNORMAL LOW (ref 8.9–10.3)
Chloride: 114 mmol/L — ABNORMAL HIGH (ref 98–111)
Creatinine, Ser: 1.52 mg/dL — ABNORMAL HIGH (ref 0.61–1.24)
GFR, Estimated: 43 mL/min — ABNORMAL LOW (ref >60)
Glucose, Bld: 99 mg/dL (ref 70–99)
Potassium: 3.6 mmol/L (ref 3.5–5.1)
Sodium: 143 mmol/L (ref 135–145)

## 2021-06-23 MED ORDER — METOPROLOL TARTRATE 25 MG PO TABS
25.0000 mg | ORAL_TABLET | Freq: Two times a day (BID) | ORAL | Status: DC
  Administered 2021-06-23 – 2021-06-29 (×13): 25 mg via ORAL
  Filled 2021-06-23 (×13): qty 1

## 2021-06-23 MED ORDER — DIVALPROEX SODIUM 125 MG PO CSDR
125.0000 mg | DELAYED_RELEASE_CAPSULE | Freq: Three times a day (TID) | ORAL | Status: DC
  Administered 2021-06-23 – 2021-06-29 (×18): 125 mg via ORAL
  Filled 2021-06-23 (×18): qty 1

## 2021-06-23 NOTE — Progress Notes (Signed)
Speech Pathology: RN noted difficulty masticating tougher consistencies on regular tray. Downgraded to mechanical soft/dysphagia 3, thin liquids.  Devon Bouchillon L. [name], MA CCC/SLP Acute Rehabilitation Services Office number [phone] Pager [phone]

## 2021-06-23 NOTE — Progress Notes (Signed)
SLP Cancellation Note  Patient Details Name: Devon Elliott. MRN: [id number] DOB: 01/21/1927   Cancelled treatment:       Reason Eval Not Completed: Fatigue/lethargy limiting ability to participate. Will continue efforts.  [name] [name], MA CCC/SLP Acute Rehabilitation Services Office number [phone] Pager [phone]    [name] 06/23/2021, 9:30 AM

## 2021-06-23 NOTE — Evaluation (Signed)
Speech Language Pathology Evaluation Patient Details Name: Devon Elliott. MRN: [id number] DOB: 09-05-1926 Today's Date: 06/23/2021 Time: 6073-7106 SLP Time Calculation (min) (ACUTE ONLY): 15 min  Problem List:  Patient Active Problem List   Diagnosis Date Noted   Acute ischemic left MCA stroke (HCC) 06/19/2021   Cerebrovascular accident (CVA) (HCC)    Esophageal ulcer 05/01/2020   Hypokalemia    AF (paroxysmal atrial fibrillation) (HCC)    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   AKI (acute kidney injury) (HCC)    Hypernatremia    Cellulitis of left leg    Gastroesophageal reflux disease without esophagitis    Oral phase dysphagia 05/22/2016   Esophageal dysphagia 05/22/2016   Left hip pain 02/08/2016   Complete tear of left rotator cuff 07/21/2014   Impingement syndrome of left shoulder 07/21/2014   Left supraspinatus tenosynovitis 07/21/2014   Essential hypertension 04/19/2014   Pure hypercholesterolemia 04/19/2014   Past Medical History:  Past Medical History:  Diagnosis Date   Complete tear of left rotator cuff 07/21/2014   Essential hypertension 04/19/2014   Impingement syndrome of left shoulder 07/21/2014   Left hip pain 02/08/2016   Left supraspinatus tenosynovitis 07/21/2014   Pure hypercholesterolemia 04/19/2014   Past Surgical History:  Past Surgical History:  Procedure Laterality Date   ELBOW SURGERY  1995   ESOPHAGOGASTRODUODENOSCOPY N/A 05/01/2020   Procedure: ESOPHAGOGASTRODUODENOSCOPY (EGD);  Surgeon: Toledo, Boykin Nearing, MD;  Location: [facility] ENDOSCOPY;  Service: Gastroenterology;  Laterality: N/A;   ESOPHAGOGASTRODUODENOSCOPY (EGD) WITH PROPOFOL N/A 05/31/2016   Procedure: ESOPHAGOGASTRODUODENOSCOPY (EGD) WITH PROPOFOL;  Surgeon: Wyline Mood, MD;  Location: [facility] ENDOSCOPY;  Service: Endoscopy;  Laterality: N/A;   IR CT HEAD LTD  06/19/2021   IR PERCUTANEOUS ART THROMBECTOMY/INFUSION INTRACRANIAL INC DIAG ANGIO  06/19/2021   IR US GUIDE VASC ACCESS RIGHT  06/19/2021    KYPHOPLASTY N/A 07/30/2019   Procedure: L1 KYPHOPLASTY;  Surgeon: Kennedy Bucker, MD;  Location: [facility] ORS;  Service: Orthopedics;  Laterality: N/A;   RADIOLOGY WITH ANESTHESIA N/A 06/19/2021   Procedure: IR WITH ANESTHESIA;  Surgeon: Radiologist, Medication, MD;  Location: [facility] OR;  Service: Radiology;  Laterality: N/A;   HPI:  87 yo male admitted 06/19/21 at [facility] with aphasia and right side weakness. PMH: mild dementia, HTN, HLD, AF not on AC, complete tear of left rotator cuff, Essential HTN, Impingement syndrome of left shoulder; Left hip pain; Left supraspinatus tenosynovitis; Pure hypercholesterolemia; Oral phase dysphagia; Esophageal dysphagia; Esophageal mass; AKI, Hypernatremia; Cellulitis of left leg; Gastroesophageal reflux disease without esophagitis; Esophageal ulcer; Hypokalemia; PAFib, CVA; and Acute ischemic left MCA stroke (HCC).  Pt had MBSS in Dec 2017 showing functional oropharyngal swallowing.   Assessment / Plan / Recommendation Clinical Impression  [name] participated in speech/language assessment - chart states baseline mild cognitive impairment.  He required min cues for attention. Followed one-step commands intermittently; yes/no reliability for biographical information was poor.  He had difficulty following instructions for testing and tended to repeat clinician instructions rather than executing action.  There was notable verbal perseveration with difficulty shifting target word (e.g., naming a spoon, then perseverating on that term for subsequent objects during naming task). Performance fluctuated during session - he was highly distracted by bilateral soft mitts and was difficult to redirect. However, he smiled and engaged in social communication.  Speech volume was low and often difficult to understand.  Recommend SLP f/u to address basic communication. D/W RN.    SLP Assessment  SLP Recommendation/Assessment: Patient needs continued Speech [facility] Pathology Services  SLP  Visit Diagnosis: Cognitive communication deficit (R41.841)    Recommendations for follow up therapy are one component of a multi-disciplinary discharge planning process, led by the attending physician.  Recommendations may be updated based on patient status, additional functional criteria and insurance authorization.    Follow Up Recommendations  Acute inpatient rehab (3hours/day)    Assistance Recommended at Discharge  Frequent or constant Supervision/Assistance  Functional Status Assessment Patient has had a recent decline in their functional status and demonstrates the ability to make significant improvements in function in a reasonable and predictable amount of time.  Frequency and Duration min 2x/week  2 weeks      SLP Evaluation Cognition  Overall Cognitive Status: Impaired/Different from baseline Arousal/Alertness: Awake/alert Orientation Level: Oriented to person;Disoriented to place;Disoriented to time;Disoriented to situation Attention: Sustained Sustained Attention: Impaired Sustained Attention Impairment: Verbal basic Awareness: Impaired Awareness Impairment: Intellectual impairment       Comprehension  Auditory Comprehension Overall Auditory Comprehension: Impaired Yes/No Questions: Impaired Basic Biographical Questions: 26-50% accurate Commands: Impaired One Step Basic Commands: 25-49% accurate Visual Recognition/Discrimination Discrimination: Exceptions to [facility] Common Objects: Unable to indentify Reading Comprehension Reading Status: Not tested    Expression Expression Primary Mode of Expression: Verbal Verbal Expression Overall Verbal Expression: Impaired Automatic Speech: Name (unable to follow instructions for DOW, counting) Level of Generative/Spontaneous Verbalization: Sentence Repetition: Impaired Naming: Impairment Confrontation: Impaired Common Objects: Unable to indentify Convergent: 50-74% accurate Verbal Errors: Perseveration Written  Expression Written Expression: Not tested   Oral / Motor  Oral Motor/Sensory Function Overall Oral Motor/Sensory Function: Mild impairment Facial Symmetry: Abnormal symmetry left Motor Speech Overall Motor Speech: Appears within functional limits for tasks assessed   GO                   Lyrika Souders L. Tivis Ringer, [place] CCC/SLP Acute Rehabilitation Services Office number [phone] Pager [phone]  Juan Quam Laurice 06/23/2021, 2:02 PM

## 2021-06-23 NOTE — Progress Notes (Signed)
OT Cancellation Note  Patient Details Name: Devon Elliott. MRN: [id number] DOB: 15-Sep-1926   Cancelled Treatment:    Reason Eval/Treat Not Completed: Patient with nursing, increased restless noted, patient pulled IV out, and RN not wanting out of bed performed.  OT to hold for today, and continue efforts as appropriate.    Kyoko Elsea D Gudrun Axe 06/23/2021, 5:26 PM

## 2021-06-23 NOTE — Plan of Care (Signed)
  Problem: Education: Goal: Knowledge of disease or condition will improve Outcome: Progressing Goal: Knowledge of secondary prevention will improve (SELECT ALL) Outcome: Progressing Goal: Knowledge of patient specific risk factors will improve (INDIVIDUALIZE FOR PATIENT) Outcome: Progressing   Problem: Health Behavior/Discharge Planning: Goal: Ability to manage health-related needs will improve Outcome: Progressing   Problem: Nutrition: Goal: Risk of aspiration will decrease Outcome: Progressing Goal: Dietary intake will improve Outcome: Progressing   Problem: Ischemic Stroke/TIA Tissue Perfusion: Goal: Complications of ischemic stroke/TIA will be minimized Outcome: Progressing   Problem: Education: Goal: Knowledge of General Education information will improve Description: Including pain rating scale, medication(s)/side effects and non-pharmacologic comfort measures Outcome: Progressing   Problem: Health Behavior/Discharge Planning: Goal: Ability to manage health-related needs will improve Outcome: Progressing   Problem: Clinical Measurements: Goal: Ability to maintain clinical measurements within normal limits will improve Outcome: Progressing Goal: Will remain free from infection Outcome: Progressing Goal: Diagnostic test results will improve Outcome: Progressing Goal: Respiratory complications will improve Outcome: Progressing Goal: Cardiovascular complication will be avoided Outcome: Progressing   Problem: Activity: Goal: Risk for activity intolerance will decrease Outcome: Progressing   Problem: Nutrition: Goal: Adequate nutrition will be maintained Outcome: Progressing   Problem: Coping: Goal: Level of anxiety will decrease Outcome: Progressing   Problem: Elimination: Goal: Will not experience complications related to bowel motility Outcome: Progressing Goal: Will not experience complications related to urinary retention Outcome: Progressing    Problem: Pain Managment: Goal: General experience of comfort will improve Outcome: Progressing   Problem: Safety: Goal: Ability to remain free from injury will improve Outcome: Progressing   Problem: Skin Integrity: Goal: Risk for impaired skin integrity will decrease Outcome: Progressing

## 2021-06-23 NOTE — Progress Notes (Signed)
Physical Therapy Treatment Patient Details Name: Devon Elliott. MRN: [id number] DOB: 1927-05-15 Today's Date: 06/23/2021   History of Present Illness Pt is 87 yo male admitted 12/5 with Lt MCA CVA s/p thrombectomy. Pt with agitation/restlessness post op. PMhx: dementia, HLD, HTN, dysphagia, AKI, Afib    PT Comments    Pt making good progress today.  Pt with varied levels of confusion (restless/pulling lines) throughout the day, but therapy was able to see pt at time when he was able to follow commands and participate but not too lethargic (had received Seroquel about 30 mins prior to PT).  Pt requiring max multimodal cues but was able to ambulate 25' with min A of 2, and min A of 2 for transfers for safety.  Also,noted pt on 5 L O2 at arrival but tolerated therapy on RA with stable sats (notified RN).  Continue to advance as able.  Did replace mitts end of session.    Recommendations for follow up therapy are one component of a multi-disciplinary discharge planning process, led by the attending physician.  Recommendations may be updated based on patient status, additional functional criteria and insurance authorization.  Follow Up Recommendations  Acute inpatient rehab (3hours/day)     Assistance Recommended at Discharge Frequent or constant Supervision/Assistance  Equipment Recommendations  BSC/3in1    Recommendations for Other Services       Precautions / Restrictions Precautions Precautions: Fall;Other (comment) Precaution Comments: SBP<200     Mobility  Bed Mobility Overal bed mobility: Needs Assistance Bed Mobility: Supine to Sit;Sit to Supine     Supine to sit: Min assist;+2 for safety/equipment Sit to supine: Min assist;+2 for safety/equipment   General bed mobility comments: Cues/assist for safety; impulsive    Transfers Overall transfer level: Needs assistance Equipment used: 2 person hand held assist Transfers: Sit to/from Stand Sit to Stand: Min  assist;+2 safety/equipment;+2 physical assistance           General transfer comment: Pt too confused for RW; did well with HHA of 2 to steady    Ambulation/Gait Ambulation/Gait assistance: Min assist;+2 physical assistance Gait Distance (Feet): 25 Feet Assistive device: 2 person hand held assist Gait Pattern/deviations: Step-to pattern;Decreased stride length;Shuffle;Wide base of support Gait velocity: decreased     General Gait Details: HHA of 2 to guide and min A to steady; pt with shuffle gait and wide BOS   Stairs             Wheelchair Mobility    Modified Rankin (Stroke Patients Only) Modified Rankin (Stroke Patients Only) Pre-Morbid Rankin Score: No significant disability Modified Rankin: Moderately severe disability     Balance Overall balance assessment: Needs assistance Sitting-balance support: Feet supported;No upper extremity supported Sitting balance-Leahy Scale: Fair Sitting balance - Comments: requiring supervion   Standing balance support: Bilateral upper extremity supported Standing balance-Leahy Scale: Poor Standing balance comment: bil UE support with min assist                            Cognition Arousal/Alertness: Awake/alert Behavior During Therapy: WFL for tasks assessed/performed Overall Cognitive Status: Impaired/Different from baseline Area of Impairment: Problem solving                 Orientation Level: Disoriented to;Time;Situation;Place Current Attention Level: Focused Memory: Decreased short-term memory Following Commands: Follows one step commands inconsistently Safety/Judgement: Decreased awareness of safety;Decreased awareness of deficits   Problem Solving: Requires verbal cues;Requires tactile cues General  Comments: Pt has periods of restlessness/pulling lines vs lethargy depending on medication level.  Pt had received Seroquel about 30 mins prior to therapy - this seemed to work well.  He was not  lethargic yet and was able to follow some commands and participate.  Pt still confused and requiring max cues        Exercises      General Comments General comments (skin integrity, edema, etc.): Pt returned to bed and placed in chair position (did not leave in recliner due to periods of confusion/restless).  O2 sats varied throughout session but with poor pleth.  When good pleth sats 96% or greater on RA.  Pt was on 5 L at arrival. Did place back on 5 L post session due to received Seroquel and on 5 L at arrival, but notified RN sats stable during therapy on RA.      Pertinent Vitals/Pain Pain Assessment: No/denies pain    Home Living                          Prior Function            PT Goals (current goals can now be found in the care plan section) Progress towards PT goals: Progressing toward goals    Frequency    Min 4X/week      PT Plan Current plan remains appropriate    Co-evaluation              AM-PAC PT "6 Clicks" Mobility   Outcome Measure  Help needed turning from your back to your side while in a flat bed without using bedrails?: A Little Help needed moving from lying on your back to sitting on the side of a flat bed without using bedrails?: A Little Help needed moving to and from a bed to a chair (including a wheelchair)?: A Lot Help needed standing up from a chair using your arms (e.g., wheelchair or bedside chair)?: A Lot Help needed to walk in hospital room?: Total Help needed climbing 3-5 steps with a railing? : Total 6 Click Score: 12    End of Session Equipment Utilized During Treatment: Gait belt Activity Tolerance: Patient tolerated treatment well Patient left: in bed;with call bell/phone within reach;with bed alarm set Nurse Communication: Mobility status PT Visit Diagnosis: Unsteadiness on feet (R26.81);Other abnormalities of gait and mobility (R26.89);Difficulty in walking, not elsewhere classified (R26.2);Other symptoms  and signs involving the nervous system (R29.898)     Time: 1696-7893 PT Time Calculation (min) (ACUTE ONLY): 20 min  Charges:  $Gait Training: 8-22 mins                     Anise Salvo, PT Acute Rehab Services Pager [phone] [name] [phone]    Rayetta Humphrey 06/23/2021, 5:27 PM

## 2021-06-23 NOTE — Care Management Important Message (Signed)
Important Message  Patient Details  Name: Devon Elliott. MRN: [id number] Date of Birth: 1927/01/14   Medicare Important Message Given:  Yes     Sherilyn Banker 06/23/2021, 12:42 PM

## 2021-06-23 NOTE — Progress Notes (Signed)
[facility]  PROGRESS NOTE  Devon Elliott. [id number] DOB: 11-29-1926 DOA: 06/19/2021 PCP: Rusty Aus, MD   Brief HPI:   87 year old male with history of hypertension, hyperlipidemia, atrial fibrillation not on anticoagulation presented to [facility] on 12/5 with aphasia and right-sided weakness.  CTA showed distal left M2 occlusion.  Patient was brought to [facility] for neuro intervention radiology mechanical thrombectomy.  Patient did not receive tPA as he was deemed to be outside of the window. Patient underwent mechanical thrombectomy per IR, after the procedure he became agitated and restless.  He received diphenhydramine and Haldol with Ativan.  Patient became somnolent with mild desaturation into 80s on 3 L of oxygen.  Mental status was depressed, he was minimally responsive to noxious stimuli.  PCCM was subsequently consulted for airway protection. Patient received Romazicon for reversal He was transferred out of ICU [facility]  assumed care on 06/23/2021   Subjective   Patient seen and examined, pleasantly confused.  Started on Depakote per neuro today.   Assessment/Plan:    Left middle cerebral artery infarct -Secondary to embolism from atrial fibrillation -S/p mechanical thrombectomy of left MCA M2 occlusion -Excellent revascularization achieved -Patient started on heparin GTT due to atrial fibrillation -Continue aspirin  Delirium -Developed after thrombectomy -Was in the ICU, required Precedex, he has been weaned off Precedex -Patient did receive Romazicon for reversal of benzodiazepines -Started on Depakote 125 mg p.o. every 8 hours; continue Seroquel 25 mg p.o. nightly -Continue delirium precautions  Acute kidney injury -Creatinine is 1.52 today -Started on IV normal saline at 100 mL/h -Follow BMP in a.m.  Acute hypoxemic respiratory failure - multifactorial; pulmonary edema, atelectasis -Resolved -Continue I-S, flutter valve  Atrial  fibrillation -Heart rate controlled, continue metoprolol 25 mg p.o. twice daily -Started on heparin GTT  HFrEF -Currently euvolemic -Started on IV normal saline for dehydration and poor p.o. intake        Medications     aspirin  81 mg Oral Daily   atorvastatin  40 mg Oral Daily   Chlorhexidine Gluconate Cloth  6 each Topical Daily   divalproex  125 mg Oral Q8H   enoxaparin (LOVENOX) injection  65 mg Subcutaneous Q24H   memantine  5 mg Oral q morning   metoprolol tartrate  25 mg Oral BID   mupirocin ointment  1 application Nasal BID   QUEtiapine  25 mg Oral QHS   QUEtiapine  25 mg Oral Q0600     Data Reviewed:   CBG:  No results for input(s): GLUCAP in the last 168 hours.  SpO2: 97 % O2 Flow Rate (L/min): 5 L/min FiO2 (%): 100 %    Vitals:   06/22/21 2341 06/23/21 0321 06/23/21 0741 06/23/21 1146  BP: 138/83 127/84 119/66 (!) 162/95  Pulse: (!) 101 71 90 (!) 105  Resp: 20 20 18 20   Temp: 97.9 F (36.6 C) 98.4 F (36.9 C) 99.1 F (37.3 C) 98.3 F (36.8 C)  TempSrc: Axillary Axillary Axillary Axillary  SpO2: 100% 94% 100% 97%  Height:         Intake/Output Summary (Last 24 hours) at 06/23/2021 1359 Last data filed at 06/23/2021 0136 Gross per 24 hour  Intake 69.7 ml  Output 1400 ml  Net -1330.3 ml    12/07 1901 - 12/09 0700 In: 1473.4 [I.V.:573.3] Out: 1525 [Urine:1525]  There were no vitals filed for this visit.  Data Reviewed: Basic Metabolic Panel: Recent Labs  Lab 06/19/21 1207 06/19/21 1710 06/21/21 0323  06/22/21 0326 06/23/21 0941  NA 140  --  142 139 143  K 4.3  --  4.0 3.5 3.6  CL 109  --  112* 109 114*  CO2 28  --  21* 19* 22  GLUCOSE 113*  --  106* 153* 99  BUN 19  --  20 25* 33*  CREATININE 1.12 1.22 1.20 1.52* 1.52*  CALCIUM 8.6*  --  7.7* 7.9* 7.5*   Liver Function Tests: Recent Labs  Lab 06/19/21 1207  AST 21  ALT 10  ALKPHOS 56  BILITOT 0.6  PROT 6.3*  ALBUMIN 3.8   No results for input(s): LIPASE, AMYLASE  in the last 168 hours. No results for input(s): AMMONIA in the last 168 hours. CBC: Recent Labs  Lab 06/19/21 1207 06/19/21 1710 06/21/21 0323 06/22/21 0326 06/23/21 0941  WBC 6.7 9.3 11.1* 12.2* 7.4  NEUTROABS 5.1  --   --   --   --   HGB 12.7* 12.2* 11.2* 11.8* 10.2*  HCT 39.1 37.0* 35.3* 36.7* 31.4*  MCV 94.7 93.0 94.4 92.7 92.1  PLT 172 167 161 159 126*   Cardiac Enzymes: No results for input(s): CKTOTAL, CKMB, CKMBINDEX, TROPONINI in the last 168 hours. BNP (last 3 results) No results for input(s): BNP in the last 8760 hours.  ProBNP (last 3 results) No results for input(s): PROBNP in the last 8760 hours.  CBG: No results for input(s): GLUCAP in the last 168 hours.     Radiology Reports  EEG adult  Result Date: 06/22/2021 Lora Havens, MD     06/22/2021 12:50 PM Patient Name: Devon Elliott. MRN: [id number] Epilepsy Attending: Lora Havens Referring Physician/Provider: Katy Apo, NP Date: 06/22/2021 Duration: 23.08 mins Patient history: 87 year old male with left M2 occlusion status post thrombectomy now with altered mental status.  EEG to evaluate for seizure. Level of alertness: Awake AEDs during EEG study: None Technical aspects: This EEG study was done with scalp electrodes positioned according to the 10-20 International system of electrode placement. Electrical activity was acquired at a sampling rate of 500Hz  and reviewed with a high frequency filter of 70Hz  and a low frequency filter of 1Hz . EEG data were recorded continuously and digitally stored. Description: The posterior dominant rhythm consists of 8 Hz activity of moderate voltage (25-35 uV) seen predominantly in posterior head regions, symmetric and reactive to eye opening and eye closing.  EEG showed intermittent generalized and lateralized right hemisphere 3 to 6 Hz theta-delta slowing. Hyperventilation and photic stimulation were not performed.   ABNORMALITY - Intermittent slow,  generalized and lateralized right hemisphere IMPRESSION: This study is suggestive of cortical dysfunction in right hemisphere as well as mild diffuse encephalopathy, nonspecific etiology.  No seizures or epileptiform discharges were seen throughout the recording. Priyanka Barbra Sarks       Antibiotics: Anti-infectives (From admission, onward)    None         DVT prophylaxis: Lovenox  Code Status: Full code  Family Communication: No family at bedside   Consultants: Neurology PCCM  Procedures:     Objective    Physical Examination:  General-appears in no acute distress Heart-S1-S2, regular, no murmur auscultated Lungs-clear to auscultation bilaterally, no wheezing or crackles auscultated Abdomen-soft, nontender, no organomegaly Extremities-no edema in the lower extremities Neuro-alert, oriented to self only, following commands  Status is: Inpatient  Dispo: The patient is from: Home              Anticipated d/c  is to: Skilled nursing facility              Anticipated d/c date is: 06/27/2021              Patient currently not stable for discharge  Barrier to discharge-delirium  COVID-19 Labs  No results for input(s): DDIMER, FERRITIN, LDH, CRP in the last 72 hours.  Lab Results  Component Value Date   SARSCOV2NAA NEGATIVE 06/19/2021   Krebs NEGATIVE 04/30/2020   [employer] NEGATIVE 07/29/2019            Recent Results (from the past 240 hour(s))  Resp Panel by RT-PCR (Flu A&B, Covid) Nasopharyngeal Swab     Status: None   Collection Time: 06/19/21 12:07 PM   Specimen: Nasopharyngeal Swab; Nasopharyngeal(NP) swabs in vial transport medium  Result Value Ref Range Status   SARS Coronavirus 2 by RT PCR NEGATIVE NEGATIVE Final    Comment: (NOTE) SARS-CoV-2 target nucleic acids are NOT DETECTED.  The SARS-CoV-2 RNA is generally detectable in upper respiratory specimens during the acute phase of infection. The lowest concentration of SARS-CoV-2  viral copies this assay can detect is 138 copies/mL. A negative result does not preclude SARS-Cov-2 infection and should not be used as the sole basis for treatment or other patient management decisions. A negative result may occur with  improper specimen collection/handling, submission of specimen other than nasopharyngeal swab, presence of viral mutation(s) within the areas targeted by this assay, and inadequate number of viral copies(<138 copies/mL). A negative result must be combined with clinical observations, patient history, and epidemiological information. The expected result is Negative.  Fact Sheet for Patients:  EntrepreneurPulse.com.au  Fact Sheet for Healthcare Providers:  IncredibleEmployment.be  This test is no t yet approved or cleared by the [place] FDA and  has been authorized for detection and/or diagnosis of SARS-CoV-2 by FDA under an Emergency Use Authorization (EUA). This EUA will remain  in effect (meaning this test can be used) for the duration of the COVID-19 declaration under Section 564(b)(1) of the Act, 21 U.S.C.section 360bbb-3(b)(1), unless the authorization is terminated  or revoked sooner.       Influenza A by PCR NEGATIVE NEGATIVE Final   Influenza B by PCR NEGATIVE NEGATIVE Final    Comment: (NOTE) The Xpert Xpress SARS-CoV-2/FLU/RSV plus assay is intended as an aid in the diagnosis of influenza from Nasopharyngeal swab specimens and should not be used as a sole basis for treatment. Nasal washings and aspirates are unacceptable for Xpert Xpress SARS-CoV-2/FLU/RSV testing.  Fact Sheet for Patients: EntrepreneurPulse.com.au  Fact Sheet for Healthcare Providers: IncredibleEmployment.be  This test is not yet approved or cleared by the [place] FDA and has been authorized for detection and/or diagnosis of SARS-CoV-2 by FDA under an Emergency Use Authorization  (EUA). This EUA will remain in effect (meaning this test can be used) for the duration of the COVID-19 declaration under Section 564(b)(1) of the Act, 21 U.S.C. section 360bbb-3(b)(1), unless the authorization is terminated or revoked.  Performed at [facility], [address], [place], [place] [address]   Surgical PCR screen     Status: None   Collection Time: 06/22/21  6:56 PM   Specimen: Nasal Mucosa; Nasal Swab  Result Value Ref Range Status   MRSA, PCR NEGATIVE NEGATIVE Final   Staphylococcus aureus NEGATIVE NEGATIVE Final    Comment: (NOTE) The Xpert SA Assay (FDA approved for NASAL specimens in patients 52 years of age and older), is one component of a  comprehensive surveillance program. It is not intended to diagnose infection nor to guide or monitor treatment. Performed at [facility], [address] [place], [place] [address]     Meredeth Ide   [facility] If 7PM-7AM, please contact night-coverage at [email or web address], Office  [phone]   06/23/2021, 1:59 PM  LOS: 4 days

## 2021-06-23 NOTE — Progress Notes (Addendum)
STROKE TEAM PROGRESS NOTE   INTERVAL HISTORY Patient is laying in bed on face mask and bilateral mitts on. No family at bedside. RN present on rounds. He is lethargic but arouses easily. Recently received seroquel. He is confused, follows commands. Will add depakote sprinkles 125mg  TID and assess behavior/agitation tomorrow. Patient will intermittently take oral medications. No new neurological events noted.   Vitals:   06/22/21 2216 06/22/21 2341 06/23/21 0321 06/23/21 0741  BP:  138/83 127/84 119/66  Pulse:  (!) 101 71 90  Resp: 20 20 20 18   Temp: (!) 97.1 F (36.2 C) 97.9 F (36.6 C) 98.4 F (36.9 C) 99.1 F (37.3 C)  TempSrc: Axillary Axillary Axillary Axillary  SpO2:  100% 94% 100%  Height:       CBC:  Recent Labs  Lab 06/19/21 1207 06/19/21 1710 06/21/21 0323 06/22/21 0326  WBC 6.7   < > 11.1* 12.2*  NEUTROABS 5.1  --   --   --   HGB 12.7*   < > 11.2* 11.8*  HCT 39.1   < > 35.3* 36.7*  MCV 94.7   < > 94.4 92.7  PLT 172   < > 161 159   < > = values in this interval not displayed.    Basic Metabolic Panel:  Recent Labs  Lab 06/22/21 0326 06/23/21 0941  NA 139 143  K 3.5 3.6  CL 109 114*  CO2 19* 22  GLUCOSE 153* 99  BUN 25* 33*  CREATININE 1.52* 1.52*  CALCIUM 7.9* 7.5*    Lipid Panel:  Recent Labs  Lab 06/20/21 0359  CHOL 198  TRIG 44  HDL 79  CHOLHDL 2.5  VLDL 9  LDLCALC 110*    HgbA1c:  Recent Labs  Lab 06/20/21 0359  HGBA1C 5.3    Urine Drug Screen:  Recent Labs  Lab 06/19/21 1315  LABOPIA NONE DETECTED  COCAINSCRNUR NONE DETECTED  LABBENZ POSITIVE*  AMPHETMU NONE DETECTED  THCU NONE DETECTED  LABBARB NONE DETECTED     Alcohol Level  Recent Labs  Lab 06/19/21 1207  ETH <10     IMAGING past 24 hours EEG adult  Result Date: 06/22/2021 1208, MD     06/22/2021 12:50 PM Patient Name: Devon Elliott. MRN: [id number]/[id number] Epilepsy Attending: Azucena Freed Referring Physician/Provider: [id number],  NP Date: 06/22/2021 Duration: 23.08 mins Patient history: 87 year old male with left M2 occlusion status post thrombectomy now with altered mental status.  EEG to evaluate for seizure. Level of alertness: Awake AEDs during EEG study: None Technical aspects: This EEG study was done with scalp electrodes positioned according to the 10-20 International system of electrode placement. Electrical activity was acquired at a sampling rate of 500Hz  and reviewed with a high frequency filter of 70Hz  and a low frequency filter of 1Hz . EEG data were recorded continuously and digitally stored. Description: The posterior dominant rhythm consists of 8 Hz activity of moderate voltage (25-35 uV) seen predominantly in posterior head regions, symmetric and reactive to eye opening and eye closing.  EEG showed intermittent generalized and lateralized right hemisphere 3 to 6 Hz theta-delta slowing. Hyperventilation and photic stimulation were not performed.   ABNORMALITY - Intermittent slow, generalized and lateralized right hemisphere IMPRESSION: This study is suggestive of cortical dysfunction in right hemisphere as well as mild diffuse encephalopathy, nonspecific etiology.  No seizures or epileptiform discharges were seen throughout the recording. Devon Elliott 14/02/2021    PHYSICAL EXAM General:  Patient  is a confused, alert male in no acute distress. . Afebrile. Head is nontraumatic. Neck is supple without bruit.    Cardiac exam no murmur or gallop. Lungs are clear to auscultation. Distal pulses are well felt.   Neurological Exam :   Patient is oriented to name only.  He will respond to questions, but answers are not always appropriate.  He is confused and delirious.  Diminished attention, registration and recall.  Follows simple midline and one-step commands only.  He is able to move all extremities purposefully in response to commands. EOMI, no facial droop.  No focal weakness.  ASSESSMENT/PLAN [name]. is a 87  y.o. male with history of HTN, atrial fibrillation not on anticoagulation and mild dementia presenting with aphasia and right sided weakness. He was found to have an acute left MCA M2 occlusion with a large mismatch on CT perfusion.  He was taken to IR for mechanical thrombectomy. TICI 3 flow was achieved. Overnight, he has required Cleviprex for BP control and had an episode of agitation requiring sedation with ativan.  Stroke:   of left middle cerebral artery infarct  likely secondary due to embolism caused by atrial fibrillation treated with successful mechanical thrombectomy with excellent revascularization.  Patient has delirium likely due to sundowning from mild cognitive impairment/dementia at baseline Code Stroke CT head No acute abnormality. Chronic microvascular disease. ASPECTS 10.    CTA head & neck Less than 50% stenosis of right and left carotid arteries, distal left M2 MCA occlusion CT perfusion 90 mL of penumbra in left MCA territory Cerebral angio s/p mechanical thrombectomy of left M2 MCA Post IR CT no ICH/SAH MRI not done due to patient's agitation 2D Echo ejection fraction 45%.  Global hypokinesis.  Dilated left atrium.  No clot  LDL 110 HgbA1c 5.3 VTE prophylaxis - lovenox    Diet   Diet regular Room service appropriate? Yes with Assist; Fluid consistency: Thin   No antithrombotic prior to admission, now on aspirin 81 mg daily.  Therapy recommendations:  CIR Disposition:  pending  Hypertension Home meds:  metoprolol 25 mg daily Unstable Requiring Cleviprex Kepp SBP 120-140 post thrombectomy Long-term BP goal normotensive  Hyperlipidemia Home meds:  none LDL 110, goal < 70 Add Atorvastatin 40 mg  High intensity statin initiated Continue statin at discharge  Atrial fibrillation Will likely need anticoagulation with a DOAC  Other Stroke Risk Factors Advanced Age >/= 67   Other Active Problems Delirium/mild dementia Reorient PRN Delirium  precautions Urinary retention  Hospital day # 4  Devon Mart, DNP, ACNPC-AG   STROKE MD NOTE : I have personally obtained history,examined this patient, reviewed notes, independently viewed imaging studies, participated in medical decision making and plan of care.ROS completed by me personally and pertinent positives fully documented  I have made any additions or clarifications directly to the above note. Agree with note above.  Patient remains quite delirious confused and agitated requiring restraints.  Continue Seroquel and the current dose and add Depakote sprinkles 125 mg 3 times daily for agitation.  IV hydration as well.  Appreciate medical hospitalist team taking over his care.  Discussed with Dr. Sharl Ma.  No family available for discussion at the bedside today.  Greater than 50% time during this 35-minute visit was spent on counseling and coordination of care about his delirium, dementia and stroke and answering questions  Delia Heady, MD Medical Director [name] Pager: [phone] 06/23/2021 1:15 PM   To contact Stroke  Continuity provider, please refer to [email or web address]. After hours, contact General Neurology.

## 2021-06-24 ENCOUNTER — Inpatient Hospital Stay (HOSPITAL_COMMUNITY): Payer: Medicare Other

## 2021-06-24 DIAGNOSIS — E876 Hypokalemia: Secondary | ICD-10-CM

## 2021-06-24 DIAGNOSIS — I482 Chronic atrial fibrillation, unspecified: Secondary | ICD-10-CM

## 2021-06-24 DIAGNOSIS — F05 Delirium due to known physiological condition: Secondary | ICD-10-CM

## 2021-06-24 DIAGNOSIS — R410 Disorientation, unspecified: Secondary | ICD-10-CM

## 2021-06-24 LAB — CBC
HCT: 32.4 % — ABNORMAL LOW (ref 39.0–52.0)
Hemoglobin: 10.3 g/dL — ABNORMAL LOW (ref 13.0–17.0)
MCH: 29.7 pg (ref 26.0–34.0)
MCHC: 31.8 g/dL (ref 30.0–36.0)
MCV: 93.4 fL (ref 80.0–100.0)
Platelets: 121 10*3/uL — ABNORMAL LOW (ref 150–400)
RBC: 3.47 MIL/uL — ABNORMAL LOW (ref 4.22–5.81)
RDW: 15.5 % (ref 11.5–15.5)
WBC: 7.5 10*3/uL (ref 4.0–10.5)
nRBC: 0 % (ref 0.0–0.2)

## 2021-06-24 LAB — BASIC METABOLIC PANEL
Anion gap: 9 (ref 5–15)
BUN: 22 mg/dL (ref 8–23)
CO2: 20 mmol/L — ABNORMAL LOW (ref 22–32)
Calcium: 7.4 mg/dL — ABNORMAL LOW (ref 8.9–10.3)
Chloride: 114 mmol/L — ABNORMAL HIGH (ref 98–111)
Creatinine, Ser: 1.04 mg/dL (ref 0.61–1.24)
GFR, Estimated: >60 mL/min (ref >60)
Glucose, Bld: 87 mg/dL (ref 70–99)
Potassium: 3.3 mmol/L — ABNORMAL LOW (ref 3.5–5.1)
Sodium: 143 mmol/L (ref 135–145)

## 2021-06-24 MED ORDER — ENOXAPARIN SODIUM 60 MG/0.6ML IJ SOSY
60.0000 mg | PREFILLED_SYRINGE | Freq: Two times a day (BID) | INTRAMUSCULAR | Status: DC
Start: 2021-06-24 — End: 2021-06-24

## 2021-06-24 MED ORDER — APIXABAN 5 MG PO TABS
5.0000 mg | ORAL_TABLET | Freq: Two times a day (BID) | ORAL | Status: DC
  Administered 2021-06-24 – 2021-06-29 (×11): 5 mg via ORAL
  Filled 2021-06-24 (×11): qty 1

## 2021-06-24 MED ORDER — POTASSIUM CHLORIDE CRYS ER 20 MEQ PO TBCR
40.0000 meq | EXTENDED_RELEASE_TABLET | Freq: Once | ORAL | Status: AC
  Administered 2021-06-24: 40 meq via ORAL
  Filled 2021-06-24: qty 2

## 2021-06-24 NOTE — Progress Notes (Addendum)
STROKE TEAM PROGRESS NOTE   ATTENDING NOTE: I reviewed above note and agree with the assessment and plan. Pt was seen and examined.   87 year old male with history of hypertension, A. fib not on AC, mild dementia admitted for aphasia and right-sided weakness.  CT negative.  CT head and neck showed distal left M2 occlusion.  CT perfusion positive for mismatch.  Status post IR with TICI3 revascularization.  EF 45%, A1c 5.3, LDL 110.  Post IR, patient developed agitation and delirium, not able to do MRI.  Repeat CT no acute finding.  Repeat CTA showed left M2 patent, unchanged right distal VA occlusion and intracranial stenosis.  Creatinine 1.04.  On exam today, patient awake alert, reclining in bed, talkative and fluent language, however disorientation with confusion.  No focal deficit.  Etiology for patient stroke likely due to A. fib not on AC.  Currently he is on Lovenox twice daily due to previous agitation and NPO.  Will switch to Eliquis today.  We will attempt MRI for stroke location.  BP fluctuate, on metoprolol 25 twice daily.  Continue Depakote, Seroquel and Namenda for delirium and agitation.  PT/OT recommend CIR.  For detailed assessment and plan, please refer to above as I have made changes wherever appropriate.   Devon Hawking, MD PhD Stroke Neurology 06/24/2021 3:18 PM    INTERVAL HISTORY Patient is seen in his room with no family at the bedside.  He is calm and cooperative, but still confused.  He has been hemodynamically stable, his neurological exam is stable and he has had no acute events overnight.  Will plan to get MRI now that patient is more calm and able to cooperate.  Vitals:   06/23/21 1948 06/23/21 2329 06/24/21 0345 06/24/21 0723  BP: (!) 141/101 (!) 151/88 (!) 157/100 (!) 165/115  Pulse: 93 95 91 (!) 110  Resp: 19 20 18 16   Temp: 98.3 F (36.8 C) 99 F (37.2 C) 98.9 F (37.2 C) 98.2 F (36.8 C)  TempSrc: Oral Oral Oral Oral  SpO2: 98% 100% 99% 96%  Height:        CBC:  Recent Labs  Lab 06/19/21 1207 06/19/21 1710 06/23/21 0941 06/24/21 0317  WBC 6.7   < > 7.4 7.5  NEUTROABS 5.1  --   --   --   HGB 12.7*   < > 10.2* 10.3*  HCT 39.1   < > 31.4* 32.4*  MCV 94.7   < > 92.1 93.4  PLT 172   < > 126* 121*   < > = values in this interval not displayed.    Basic Metabolic Panel:  Recent Labs  Lab 06/23/21 0941 06/24/21 0317  NA 143 143  K 3.6 3.3*  CL 114* 114*  CO2 22 20*  GLUCOSE 99 87  BUN 33* 22  CREATININE 1.52* 1.04  CALCIUM 7.5* 7.4*    Lipid Panel:  Recent Labs  Lab 06/20/21 0359  CHOL 198  TRIG 44  HDL 79  CHOLHDL 2.5  VLDL 9  LDLCALC 110*    HgbA1c:  Recent Labs  Lab 06/20/21 0359  HGBA1C 5.3    Urine Drug Screen:  Recent Labs  Lab 06/19/21 1315  LABOPIA NONE DETECTED  COCAINSCRNUR NONE DETECTED  LABBENZ POSITIVE*  AMPHETMU NONE DETECTED  THCU NONE DETECTED  LABBARB NONE DETECTED     Alcohol Level  Recent Labs  Lab 06/19/21 1207  ETH <10     IMAGING past 24 hours No results found.  PHYSICAL EXAM General:  Patient is a pleasantly confused, well-developed male in no acute distress.   NEURO:  Mental Status: AA&Ox1, able to state city but not that he is in a hospital. Able to converse but short term memory problems noted. Speech/Language: speech is without dysarthria or aphasia. Repetition and fluency, some difficulties with comprehension noted, likely due to short term memory loss.  Cranial Nerves:  II: PERRL. Visual fields full.  III, IV, VI: EOMI. Eyelids elevate symmetrically.  V: Sensation is intact to light touch and symmetrical to face.  VII: Left sided facial droop present VIII: hearing intact to voice. IX, X: Phonation is normal.  XII: tongue is midline without fasciculations. Motor: 5/5 strength to all muscle groups tested.  Tone: is normal and bulk is normal Sensation- Intact to light touch bilaterally. Extinction absent to light touch to DSS.  Coordination: FTN intact  bilaterally, No drift.  Gait- deferred   ASSESSMENT/PLAN [name]. is a 87 y.o. male with history of HTN, atrial fibrillation not on anticoagulation and mild dementia presenting with aphasia and right sided weakness. He was found to have an acute left MCA M2 occlusion with a large mismatch on CT perfusion.  He was taken to IR for mechanical thrombectomy. TICI 3 flow was achieved. He has been off Cleviprex, and his agitation has improved.  As patient is now calm and cooperative, will try to obtain brain MRI.  Stroke: left MCA infarct s/p IR with IR with TICI3, eombolic likely secondary due to afib not on [facility]  Code Stroke CT head No acute abnormality. Chronic microvascular disease. ASPECTS 10.    CTA head & neck Less than 50% stenosis of right and left carotid arteries, distal left M2 MCA occlusion CT perfusion 90 mL of penumbra in left MCA territory s/p IR with TICI3 of left M2 MCA CT repeat no acute infarct, no bleeding MRI pending 2D Echo EF 45%.  Global hypokinesis.  Dilated left atrium.  LDL 110 HgbA1c 5.3 VTE prophylaxis - lovenox No antithrombotic prior to admission, now on eliquis 5mg  bid.  Therapy recommendations:  CIR Disposition:  pending  Atrial fibrillation Not on AC PTA Was on lovenox bid due to agitation and NPO Now passed swallow Will transition to eliquis  Post op delirium/confusion likely due to sundowning from cognitive  impairment/dementia at baseline On seroquel bid and depakote 125 Q8h Continue Namenda  Hypertension Home meds:  metoprolol 25 mg daily, continued in hospital Stable No longer requiring Cleviprex Long-term BP goal normotensive  Hyperlipidemia Home meds:  none LDL 110, goal < 70 On Atorvastatin 40 mg  Continue statin at discharge  Other Stroke Risk Factors Advanced Age >/= 62   Other Active Problems Urinary retention  Hospital day # 5  [name] 76 , MSN, AGACNP-BC [facility] See Amion for  schedule and pager information 06/24/2021 11:32 AM    To contact Stroke Continuity provider, please refer to 14/04/2021. After hours, contact General Neurology.

## 2021-06-24 NOTE — Progress Notes (Addendum)
ANTICOAGULATION CONSULT NOTE   Pharmacy Consult for Lovenox >> apixaban Indication: atrial fibrillation  Allergies  Allergen Reactions   Ace Inhibitors     Other reaction(s): Kidney Disorder   Clarithromycin Hives   Etodolac     Other reaction(s): Kidney Disorder   Levofloxacin Swelling    Patient Measurements: Height: 5\' 9"  (175.3 cm) (From 04/2020) IBW/kg (Calculated) : 70.7  Vital Signs: Temp: 98.2 F (36.8 C) (12/10 0723) Temp Source: Oral (12/10 0723) BP: 165/115 (12/10 0723) Pulse Rate: 110 (12/10 0723)  Labs: Recent Labs    06/22/21 0326 06/22/21 1140 06/23/21 0941 06/24/21 0317  HGB 11.8*  --  10.2* 10.3*  HCT 36.7*  --  31.4* 32.4*  PLT 159  --  126* 121*  HEPARINUNFRC <0.10* 0.21*  --   --   CREATININE 1.52*  --  1.52* 1.04     Estimated Creatinine Clearance: 43.1 mL/min (by C-G formula based on SCr of 1.04 mg/dL).   Medical History: Past Medical History:  Diagnosis Date   Complete tear of left rotator cuff 07/21/2014   Essential hypertension 04/19/2014   Impingement syndrome of left shoulder 07/21/2014   Left hip pain 02/08/2016   Left supraspinatus tenosynovitis 07/21/2014   Pure hypercholesterolemia 04/19/2014    Medications:  Scheduled:   aspirin  81 mg Oral Daily   atorvastatin  40 mg Oral Daily   Chlorhexidine Gluconate Cloth  6 each Topical Daily   divalproex  125 mg Oral Q8H   enoxaparin (LOVENOX) injection  65 mg Subcutaneous Q24H   memantine  5 mg Oral q morning   metoprolol tartrate  25 mg Oral BID   mupirocin ointment  1 application Nasal BID   QUEtiapine  25 mg Oral QHS   QUEtiapine  25 mg Oral Q0600    Assessment: 89 yom who presented with left to left middle cerebral artery infarct secondary to embolism due to atrial fibrillation status-post successful mechanical thrombectomy with revascularization. Pharmacy consulted to transition IV Heparin to Lovenox due to difficulty with obtaining labs. Post-IR CT head - no bleed seen. CBC  stable. Bruising noted on arms but no overt bleeding.  SCR bumped on 12/8 likely due to diuresis but improved today down to 1.04 with CrCl now >30.  Goal of Therapy:  Anti-Xa level 0.6-1 units/ml 4hrs after LMWH dose given as appropriate Monitor platelets by anticoagulation protocol: Yes   Plan:  Adjust Lovenox to 60mg  SQ q12h for CrCl>30 (will round to the closest syringe size, down with advanced age and borderline CrCl) Monitor renal function and adjust as needed.  Monitor CBC and for signs and symptoms of bleeding   14/8, PharmD, BCPS Please check AMION for all [facility] Pharm[facility] contact numbers Clinical Pharmacist 06/24/2021 10:57 AM   ADDENDUM - Pharmacy consulted to transition Lovenox to apixaban for afib. SCr down to 1.04 today. Hg low stable, plt down to 121 today. Weight 67.2 kg. No bleed issues reported.  Plan: D/c Lovenox >> start apixaban 5mg  PO BID at time next dose of Lovenox would be due Monitor CBC, SCr, s/sx bleeding   [facility], PharmD, BCPS Please check AMION for all [facility] contact numbers Clinical Pharmacist 06/24/2021 12:11 PM

## 2021-06-24 NOTE — Progress Notes (Addendum)
   Palliative Medicine Inpatient Follow Up Note  Reason for Consultation: Establishing goals of care   HPI/Patient Profile: 87 y.o. male  with past medical history of htn, hld, dysphagia, AKI, GERD, and a fib admitted on 06/19/2021 with CVA. Had mechanical thrombectomy. Since that time has had agitation and restlessness.  PMT consulted to discuss Devon Elliott.    Today's Discussion (06/24/2021):  *Please note that this is a verbal dictation therefore any spelling or grammatical errors are due to the "[employer]" system interpretation.  Chart reviewed.  I met with Devon Elliott at bedside. He was answering questions well though did have some expressive aphasia. He was able to follow all directions when asked for completion of a neurological exam.  I spoke to patients night RN, Mickel Baas who shared that Devon Elliott had a good night. He wasn't combative or notable delirious from her purview. He did complain a bit about medication burden but otherwise was well and appropriate.  I called patients spouse, June. She shares with me that she was up all night sick with an upset stomach. We reviewed Devon Elliott's case and the improvements that he is making. Patient spouse was very glad to hear that it seems Devon Elliott is improving.She expresses to me her concerns regarding his health state and her apprehensions moving forward. Provided emotional support through therapeutic listening.  Secure chat with patients day RN, Lu Duffel to mobilize today.   Questions and concerns addressed. Palliative support provided.  Objective Assessment: Vital Signs Vitals:   06/24/21 0345 06/24/21 0723  BP: (!) 157/100 (!) 165/115  Pulse: 91 (!) 110  Resp: 18 16  Temp: 98.9 F (37.2 C) 98.2 F (36.8 C)  SpO2: 99% 96%    Intake/Output Summary (Last 24 hours) at 06/24/2021 3785 Last data filed at 06/24/2021 0000 Gross per 24 hour  Intake 1548.43 ml  Output 1150 ml  Net 398.43 ml   Gen:  Older caucasian M in NAD HEENT: moist mucous  membranes CV: Irregular rate and rhythm  PULM:  On 5LPM [place] ABD: soft/nontender  EXT: No edema  Neuro: Alert and oriented x1  SUMMARY OF RECOMMENDATIONS   - Continue full code/full scope care   - PT/OT/Speech  - Delirium precautions  - Ongoing incremental Palliative support  - Appears improvements are being made  - Dispo plan appears to be for CIR if insurance authorizes   Time Spent: 35 Greater than 50% of the time was spent in counseling and coordination of care ______________________________________________________________________________________ [place] Team Team Cell Phone: [phone] Please utilize secure chat with additional questions, if there is no response within 30 minutes please call the above phone number  Palliative Medicine Team providers are available by phone from 7am to 7pm daily and can be reached through the team cell phone.  Should this patient require assistance outside of these hours, please call the patient's attending physician.

## 2021-06-24 NOTE — Discharge Instructions (Signed)

## 2021-06-24 NOTE — Progress Notes (Signed)
[facility]  PROGRESS NOTE  Devon Elliott. [id number] DOB: [birth year] DOA: 06/19/2021 PCP: Danella Penton, MD   Brief HPI:   87 year old male with history of hypertension, hyperlipidemia, atrial fibrillation not on anticoagulation presented to [facility] on 12/5 with aphasia and right-sided weakness.  CTA showed distal left M2 occlusion.  Patient was brought to [name] for neuro intervention radiology mechanical thrombectomy.  Patient did not receive tPA as he was deemed to be outside of the window. Patient underwent mechanical thrombectomy per IR, after the procedure he became agitated and restless.  He received diphenhydramine and Haldol with Ativan.  Patient became somnolent with mild desaturation into 80s on 3 L of oxygen.  Mental status was depressed, he was minimally responsive to noxious stimuli.  PCCM was subsequently consulted for airway protection. Patient received Romazicon for reversal He was transferred out of ICU [facility]  assumed care on 06/23/2021   Subjective   Mental status has significantly improved, answering questions appropriately.     Assessment/Plan:    Left middle cerebral artery infarct -Secondary to embolism from atrial fibrillation -S/p mechanical thrombectomy of left MCA M2 occlusion -Excellent revascularization achieved -Patient started on heparin GTT due to atrial fibrillation -Continue aspirin  Delirium -Developed after thrombectomy -Improved -Was in the ICU, required Precedex, he has been weaned off Precedex -Patient did receive Romazicon for reversal of benzodiazepines -Started on Depakote 125 mg p.o. every 8 hours; continue Seroquel 25 mg p.o. nightly -Continue delirium precautions  Acute kidney injury -Creatinine was 1.52 today; started on IV normal saline at 100 mL/h -IV fluids stopped -Acute kidney injury has resolved, today creatinine is 1.04  Hypokalemia -Potassium is 3.3 -We will give 1 dose of K-Dur 40 meq p.o. x1 -Follow BMP  in am  Acute hypoxemic respiratory failure - multifactorial; pulmonary edema, atelectasis -Resolved -Continue I-S, flutter valve  Atrial fibrillation -Heart rate controlled, continue metoprolol 25 mg p.o. twice daily -Started on heparin GTT  HFrEF -Currently euvolemic -Received IV fluids briefly as above -He is off IV fluids now  Hypertension -Blood pressure is elevated -Continue p.o. metoprolol -We will add amlodipine 5 mg daily      Medications     aspirin  81 mg Oral Daily   atorvastatin  40 mg Oral Daily   Chlorhexidine Gluconate Cloth  6 each Topical Daily   divalproex  125 mg Oral Q8H   enoxaparin (LOVENOX) injection  65 mg Subcutaneous Q24H   memantine  5 mg Oral q morning   metoprolol tartrate  25 mg Oral BID   mupirocin ointment  1 application Nasal BID   potassium chloride  40 mEq Oral Once   QUEtiapine  25 mg Oral QHS   QUEtiapine  25 mg Oral Q0600     Data Reviewed:   CBG:  No results for input(s): GLUCAP in the last 168 hours.  SpO2: 96 % O2 Flow Rate (L/min): 5 L/min FiO2 (%): 100 %    Vitals:   06/23/21 1948 06/23/21 2329 06/24/21 0345 06/24/21 0723  BP: (!) 141/101 (!) 151/88 (!) 157/100 (!) 165/115  Pulse: 93 95 91 (!) 110  Resp: 19 20 18 16   Temp: 98.3 F (36.8 C) 99 F (37.2 C) 98.9 F (37.2 C) 98.2 F (36.8 C)  TempSrc: Oral Oral Oral Oral  SpO2: 98% 100% 99% 96%  Height:         Intake/Output Summary (Last 24 hours) at 06/24/2021 0846 Last data filed at 06/24/2021 0000 Gross per 24  hour  Intake 1548.43 ml  Output 1150 ml  Net 398.43 ml    12/08 1901 - 12/10 0700 In: 1548.4 [P.O.:200; I.V.:1348.4] Out: 2050 [Urine:2050]  There were no vitals filed for this visit.  Data Reviewed: Basic Metabolic Panel: Recent Labs  Lab 06/19/21 1207 06/19/21 1710 06/21/21 0323 06/22/21 0326 06/23/21 0941 06/24/21 0317  NA 140  --  142 139 143 143  K 4.3  --  4.0 3.5 3.6 3.3*  CL 109  --  112* 109 114* 114*  CO2 28  --   21* 19* 22 20*  GLUCOSE 113*  --  106* 153* 99 87  BUN 19  --  20 25* 33* 22  CREATININE 1.12 1.22 1.20 1.52* 1.52* 1.04  CALCIUM 8.6*  --  7.7* 7.9* 7.5* 7.4*   Liver Function Tests: Recent Labs  Lab 06/19/21 1207  AST 21  ALT 10  ALKPHOS 56  BILITOT 0.6  PROT 6.3*  ALBUMIN 3.8   No results for input(s): LIPASE, AMYLASE in the last 168 hours. No results for input(s): AMMONIA in the last 168 hours. CBC: Recent Labs  Lab 06/19/21 1207 06/19/21 1710 06/21/21 0323 06/22/21 0326 06/23/21 0941 06/24/21 0317  WBC 6.7 9.3 11.1* 12.2* 7.4 7.5  NEUTROABS 5.1  --   --   --   --   --   HGB 12.7* 12.2* 11.2* 11.8* 10.2* 10.3*  HCT 39.1 37.0* 35.3* 36.7* 31.4* 32.4*  MCV 94.7 93.0 94.4 92.7 92.1 93.4  PLT 172 167 161 159 126* 121*   Cardiac Enzymes: No results for input(s): CKTOTAL, CKMB, CKMBINDEX, TROPONINI in the last 168 hours. BNP (last 3 results) No results for input(s): BNP in the last 8760 hours.  ProBNP (last 3 results) No results for input(s): PROBNP in the last 8760 hours.  CBG: No results for input(s): GLUCAP in the last 168 hours.     Radiology Reports  EEG adult  Result Date: 06/22/2021 Lora Havens, MD     06/22/2021 12:50 PM Patient Name: Devon Elliott. MRN: [id number] Epilepsy Attending: Lora Havens Referring Physician/Provider: Katy Apo, NP Date: 06/22/2021 Duration: 23.08 mins Patient history: 87 year old male with left M2 occlusion status post thrombectomy now with altered mental status.  EEG to evaluate for seizure. Level of alertness: Awake AEDs during EEG study: None Technical aspects: This EEG study was done with scalp electrodes positioned according to the 10-20 International system of electrode placement. Electrical activity was acquired at a sampling rate of 500Hz  and reviewed with a high frequency filter of 70Hz  and a low frequency filter of 1Hz . EEG data were recorded continuously and digitally stored. Description: The  posterior dominant rhythm consists of 8 Hz activity of moderate voltage (25-35 uV) seen predominantly in posterior head regions, symmetric and reactive to eye opening and eye closing.  EEG showed intermittent generalized and lateralized right hemisphere 3 to 6 Hz theta-delta slowing. Hyperventilation and photic stimulation were not performed.   ABNORMALITY - Intermittent slow, generalized and lateralized right hemisphere IMPRESSION: This study is suggestive of cortical dysfunction in right hemisphere as well as mild diffuse encephalopathy, nonspecific etiology.  No seizures or epileptiform discharges were seen throughout the recording. Priyanka Barbra Sarks       Antibiotics: Anti-infectives (From admission, onward)    None         DVT prophylaxis: Lovenox  Code Status: Full code  Family Communication: No family at bedside   Consultants: Neurology PCCM  Procedures:  Objective    Physical Examination:  General-appears in no acute distress Heart-S1-S2, regular, no murmur auscultated Lungs-clear to auscultation bilaterally, no wheezing or crackles auscultated Abdomen-soft, nontender, no organomegaly Extremities-no edema in the lower extremities Neuro-alert, oriented to self and time only, following commands  Status is: Inpatient  Dispo: The patient is from: Home              Anticipated d/c is to: Skilled nursing facility              Anticipated d/c date is: 06/27/2021              Patient currently not stable for discharge  Barrier to discharge-delirium  COVID-19 Labs  No results for input(s): DDIMER, FERRITIN, LDH, CRP in the last 72 hours.  Lab Results  Component Value Date   SARSCOV2NAA NEGATIVE 06/19/2021   SARSCOV2NAA NEGATIVE 04/30/2020   SARSCOV2NAA NEGATIVE 07/29/2019            Recent Results (from the past 240 hour(s))  Resp Panel by RT-PCR (Flu A&B, Covid) Nasopharyngeal Swab     Status: None   Collection Time: 06/19/21 12:07 PM    Specimen: Nasopharyngeal Swab; Nasopharyngeal(NP) swabs in vial transport medium  Result Value Ref Range Status   SARS Coronavirus 2 by RT PCR NEGATIVE NEGATIVE Final    Comment: (NOTE) SARS-CoV-2 target nucleic acids are NOT DETECTED.  The SARS-CoV-2 RNA is generally detectable in upper respiratory specimens during the acute phase of infection. The lowest concentration of SARS-CoV-2 viral copies this assay can detect is 138 copies/mL. A negative result does not preclude SARS-Cov-2 infection and should not be used as the sole basis for treatment or other patient management decisions. A negative result may occur with  improper specimen collection/handling, submission of specimen other than nasopharyngeal swab, presence of viral mutation(s) within the areas targeted by this assay, and inadequate number of viral copies(<138 copies/mL). A negative result must be combined with clinical observations, patient history, and epidemiological information. The expected result is Negative.  Fact Sheet for Patients:  BloggerCourse.comhttps://www.fda.gov/media/152166/download  Fact Sheet for Healthcare Providers:  SeriousBroker.ithttps://www.fda.gov/media/152162/download  This test is no t yet approved or cleared by the [place] FDA and  has been authorized for detection and/or diagnosis of SARS-CoV-2 by FDA under an Emergency Use Authorization (EUA). This EUA will remain  in effect (meaning this test can be used) for the duration of the COVID-19 declaration under Section 564(b)(1) of the Act, 21 U.S.C.section 360bbb-3(b)(1), unless the authorization is terminated  or revoked sooner.       Influenza A by PCR NEGATIVE NEGATIVE Final   Influenza B by PCR NEGATIVE NEGATIVE Final    Comment: (NOTE) The Xpert Xpress SARS-CoV-2/FLU/RSV plus assay is intended as an aid in the diagnosis of influenza from Nasopharyngeal swab specimens and should not be used as a sole basis for treatment. Nasal washings and aspirates are  unacceptable for Xpert Xpress SARS-CoV-2/FLU/RSV testing.  Fact Sheet for Patients: BloggerCourse.comhttps://www.fda.gov/media/152166/download  Fact Sheet for Healthcare Providers: SeriousBroker.ithttps://www.fda.gov/media/152162/download  This test is not yet approved or cleared by the [place] FDA and has been authorized for detection and/or diagnosis of SARS-CoV-2 by FDA under an Emergency Use Authorization (EUA). This EUA will remain in effect (meaning this test can be used) for the duration of the COVID-19 declaration under Section 564(b)(1) of the Act, 21 U.S.C. section 360bbb-3(b)(1), unless the authorization is terminated or revoked.  Performed at [facility], [address] [place], [place] [address]  Surgical PCR screen     Status: None   Collection Time: 06/22/21  6:56 PM   Specimen: Nasal Mucosa; Nasal Swab  Result Value Ref Range Status   MRSA, PCR NEGATIVE NEGATIVE Final   Staphylococcus aureus NEGATIVE NEGATIVE Final    Comment: (NOTE) The Xpert SA Assay (FDA approved for NASAL specimens in patients 39 years of age and older), is one component of a comprehensive surveillance program. It is not intended to diagnose infection nor to guide or monitor treatment. Performed at [facility], [place] [address], [place], [place] [address]     [place] [facility] If 7PM-7AM, please contact night-coverage at [email or web address], Office  [phone]   06/24/2021, 8:46 AM  LOS: 5 days

## 2021-06-24 NOTE — Progress Notes (Signed)
Inpatient Rehab Admissions Coordinator:  ? ?Per therapy recommendations,  patient was screened for CIR candidacy by Gonsalo Cuthbertson, MS, CCC-SLP. At this time, Pt. Appears to be a a potential candidate for CIR. I will place   order for rehab consult per protocol for full assessment. Please contact me any with questions. ? ?Kariya Lavergne, MS, CCC-SLP ?Rehab Admissions Coordinator  ?[phone] (celll) ?[phone] (office) ? ?

## 2021-06-24 NOTE — Plan of Care (Signed)
  Problem: Education: Goal: Knowledge of disease or condition will improve Outcome: Progressing Goal: Knowledge of secondary prevention will improve (SELECT ALL) Outcome: Progressing Goal: Knowledge of patient specific risk factors will improve (INDIVIDUALIZE FOR PATIENT) Outcome: Progressing   Problem: Health Behavior/Discharge Planning: Goal: Ability to manage health-related needs will improve Outcome: Progressing   Problem: Nutrition: Goal: Risk of aspiration will decrease Outcome: Progressing Goal: Dietary intake will improve Outcome: Progressing   Problem: Ischemic Stroke/TIA Tissue Perfusion: Goal: Complications of ischemic stroke/TIA will be minimized Outcome: Progressing   Problem: Education: Goal: Knowledge of General Education information will improve Description: Including pain rating scale, medication(s)/side effects and non-pharmacologic comfort measures Outcome: Progressing   Problem: Health Behavior/Discharge Planning: Goal: Ability to manage health-related needs will improve Outcome: Progressing   Problem: Clinical Measurements: Goal: Ability to maintain clinical measurements within normal limits will improve Outcome: Progressing Goal: Will remain free from infection Outcome: Progressing Goal: Diagnostic test results will improve Outcome: Progressing Goal: Respiratory complications will improve Outcome: Progressing Goal: Cardiovascular complication will be avoided Outcome: Progressing   Problem: Activity: Goal: Risk for activity intolerance will decrease Outcome: Progressing   Problem: Nutrition: Goal: Adequate nutrition will be maintained Outcome: Progressing   Problem: Coping: Goal: Level of anxiety will decrease Outcome: Progressing   Problem: Elimination: Goal: Will not experience complications related to bowel motility Outcome: Progressing Goal: Will not experience complications related to urinary retention Outcome: Progressing    Problem: Pain Managment: Goal: General experience of comfort will improve Outcome: Progressing   Problem: Safety: Goal: Ability to remain free from injury will improve Outcome: Progressing   Problem: Skin Integrity: Goal: Risk for impaired skin integrity will decrease Outcome: Progressing

## 2021-06-24 NOTE — Progress Notes (Signed)
Inpatient Rehab Admissions Coordinator:   I met with Pt. To discuss potential CIR admit. Pt. Too confused at this time. Voicemail left for patient's son with request for callback.   Clemens Catholic, [place], [place] Admissions Coordinator  [phone] ([place]) [phone] (office)

## 2021-06-25 LAB — BASIC METABOLIC PANEL
Anion gap: 9 (ref 5–15)
BUN: 16 mg/dL (ref 8–23)
CO2: 22 mmol/L (ref 22–32)
Calcium: 8.2 mg/dL — ABNORMAL LOW (ref 8.9–10.3)
Chloride: 109 mmol/L (ref 98–111)
Creatinine, Ser: 0.98 mg/dL (ref 0.61–1.24)
GFR, Estimated: >60 mL/min (ref >60)
Glucose, Bld: 97 mg/dL (ref 70–99)
Potassium: 3.3 mmol/L — ABNORMAL LOW (ref 3.5–5.1)
Sodium: 140 mmol/L (ref 135–145)

## 2021-06-25 MED ORDER — POTASSIUM CHLORIDE CRYS ER 20 MEQ PO TBCR
40.0000 meq | EXTENDED_RELEASE_TABLET | Freq: Once | ORAL | Status: AC
  Administered 2021-06-25: 40 meq via ORAL
  Filled 2021-06-25: qty 2

## 2021-06-25 NOTE — PMR Pre-admission (Signed)
PMR Admission Coordinator Pre-Admission Assessment  Patient: Devon Elliott. is an 87 y.o., male MRN: [id number] DOB: [birth year] Height: 5' 9"  (175.3 cm) (From 04/2020) Weight: 67.2 kg  Insurance Information HMO: yes    PPO:      PCP:      IPA:      80/20:      OTHER:  PRIMARY: Blue Medicare      Policy#: [id number]      Subscriber: Pt CM Name: Devon Elliott      Phone#: [phone]     Fax#: [phone] Call Anderson Malta for Josem Kaufmann number, dates, and fax # on day of admission. Pre-Cert#: [id number]      Employer: n/a Benefits:  Phone #: [phone]     Name:  Devon Elliott Date: 07/16/2020 - still active Deductible: does not have one OOP Max: $3,900 ($129.08 met) CIR: $295/day co-pay for days 1-6 SNF: $0.00 Copayment per day for days 1-20; $188 Copayment per day for days 21-60; $0.00 copayment for days 61-100 Maximum of 100 days/benefit period Outpatient:  $40 copay/visit Home Health:  100% coverage DME: 80% coverage, 20% co-insurance  SECONDARY: none      Policy#:      Phone#:   Financial Counselor:       Phone#:   The Therapist, art Information Summary" for patients in Inpatient Rehabilitation Facilities with attached "Privacy Act Heath Records" was provided and verbally reviewed with: Patient  Emergency Contact Information Contact Information     Name Relation Home Work Mobile   Devon Elliott [phone]  [place] [phone]     [place] Relative   [phone]       Current Medical History  Patient Admitting Diagnosis: CVA  History of Present Illness: Devon Elliott. is a 87 y.o. male with PMH significant for hypertension, atrial fibrillation on anticoagulation who initially presented to [facility] for aphasia.  Patient was evaluated by Dr. Lorrene Reid with neurology team at [facility]. Per wife, had speech difficulty yesterday afternoon and not feeling went. Woke up this AM  and was fine. Dressed himself and sat on the recliner when he had acute onset and severe word finding difficulties with some right-sided weakness.  This happened around 0900 on 06/19/2021.  EMS was called and patient was brought into [facility] where CT head without contrast with aspects of 10.  CT angio head and neck with distal left MCA M2 occlusion. CTP with no core and a 28ms mismatch. [facility] underwent [facility] thrombectomy per IR, after the procedure he became agitated and restless.  He received diphenhydramine and Haldol with Ativan.  Patient became somnolent with mild desaturation into 80s on 3 L of oxygen.  Mental status was depressed, he was minimally responsive to noxious stimuli.  PCCM was subsequently consulted for airway protection; however, Pt. Did not require intubation. Received romazicon for reversal. Pt. Seen by PT and OT who recommend CIR for return to PLOF.   Complete NIHSS TOTAL: 11  [facility]  Past Medical History  Past Medical History:  Diagnosis Date   Complete tear of left rotator cuff 07/21/2014   Essential hypertension 04/19/2014   Impingement syndrome of left shoulder 07/21/2014   Left hip pain 02/08/2016   Left supraspinatus tenosynovitis 07/21/2014   Pure hypercholesterolemia 04/19/2014    Has the patient had major surgery during 100 days  prior to admission? Yes  Family History   family history includes Diabetes in his father; Pancreatic cancer in his mother.  Current Medications  Current Facility-Administered Medications:    acetaminophen (TYLENOL) tablet 650 mg, 650 mg, Oral, Q4H PRN, 650 mg at 06/24/21 0701 **OR** acetaminophen (TYLENOL) 160 MG/5ML solution 650 mg, 650 mg, Per Tube, Q4H PRN **OR** acetaminophen (TYLENOL) suppository 650 mg, 650 mg, Rectal, Q4H PRN, Donnetta Simpers, MD   albuterol (PROVENTIL) (2.5  MG/3ML) 0.083% nebulizer solution 2.5 mg, 2.5 mg, Nebulization, Q2H PRN, Bowser, Laurel Dimmer, NP   apixaban (ELIQUIS) tablet 5 mg, 5 mg, Oral, BID, von Dohlen, Haley B, RPH, 5 mg at 06/29/21 0757   atorvastatin (LIPITOR) tablet 40 mg, 40 mg, Oral, Daily, de Yolanda Manges, Allensworth E, NP, 40 mg at 06/29/21 5916   Chlorhexidine Gluconate Cloth 2 % PADS 6 each, 6 each, Topical, Daily, Donnetta Simpers, MD, 6 each at 06/29/21 1241   divalproex (DEPAKOTE SPRINKLE) capsule 125 mg, 125 mg, Oral, Q8H, Wolfe, Denise A, NP, 125 mg at 06/29/21 0507   haloperidol lactate (HALDOL) injection 2.5 mg, 2.5 mg, Intravenous, Q6H PRN, Leonie Man, Pramod S, MD, 2.5 mg at 06/29/21 0758   labetalol (NORMODYNE) injection 10 mg, 10 mg, Intravenous, Q30 min PRN, Bowser, Grace E, NP, 10 mg at 06/22/21 1204   memantine (NAMENDA) tablet 5 mg, 5 mg, Oral, q morning, Bowser, Grace E, NP, 5 mg at 06/29/21 0758   metoprolol tartrate (LOPRESSOR) tablet 25 mg, 25 mg, Oral, BID, Darrick Meigs, Gagan S, MD, 25 mg at 06/29/21 0758   pantoprazole (PROTONIX) EC tablet 40 mg, 40 mg, Oral, Q1200, Darrick Meigs, Gagan S, MD, 40 mg at 06/29/21 1243   QUEtiapine (SEROQUEL) tablet 25 mg, 25 mg, Oral, Q6H PRN, de Yolanda Manges, [name], NP, 25 mg at 06/26/21 0957   QUEtiapine (SEROQUEL) tablet 25 mg, 25 mg, Oral, QHS, Sethi, Pramod S, MD, 25 mg at 06/28/21 2141   QUEtiapine (SEROQUEL) tablet 25 mg, 25 mg, Oral, Q0600, Garvin Fila, MD, 25 mg at 06/29/21 0507   senna-docusate (Senokot-S) tablet 1 tablet, 1 tablet, Oral, QHS PRN, Donnetta Simpers, MD  Patients Current Diet:  Diet Order             DIET DYS 3 Room service appropriate? Yes with Assist; Fluid consistency: Thin  Diet effective now                   Precautions / Restrictions Precautions Precautions: Fall, Other (comment) Precaution Comments: SBP<200 Restrictions Weight Bearing Restrictions: No   Has the patient had 2 or more falls or a fall with injury in the past year? Yes  Prior Activity  Level Community (5-7x/wk): active in the community PTA  Prior Functional Level Self Care: Did the patient need help bathing, dressing, using the toilet or eating? Independent  Indoor Mobility: Did the patient need assistance with walking from room to room (with or without device)? Independent  Stairs: Did the patient need assistance with internal or external stairs (with or without device)? Independent  Functional Cognition: Did the patient need help planning regular tasks such as shopping or remembering to take medications? Needed some help  Patient Information Are you of Hispanic, Latino/a,or Spanish origin?: A. No, not of Hispanic, Latino/a, or Spanish origin What is your race?: A. White Do you need or want an interpreter to communicate with a doctor or health care staff?: 0. No  Patient's Response To:  Health Literacy and Transportation Is the patient able  to respond to health literacy and transportation needs?: Yes Health Literacy - How often do you need to have someone help you when you read instructions, pamphlets, or other written material from your doctor or pharmacy?: Often In the past 12 months, has lack of transportation kept you from medical appointments or from getting medications?: No In the past 12 months, has lack of transportation kept you from meetings, work, or from getting things needed for daily living?: No  Home Assistive Devices / Equipment Home Equipment: Conservation officer, nature (2 wheels), Other (comment), Cane - single point, Shower seat  Prior Device Use: Indicate devices/aids used by the patient prior to current illness, exacerbation or injury? Walker  Current Functional Level Cognition  Arousal/Alertness: Awake/alert Overall Cognitive Status: History of cognitive impairments - at baseline Current Attention Level: Focused, Sustained Orientation Level: Oriented X4 Following Commands: Follows one step commands inconsistently, Follows one step commands with  increased time Safety/Judgement: Decreased awareness of safety, Decreased awareness of deficits General Comments: baseline confusion with hx of dementia. Following ~75% of commands with encouragement. Attention: Sustained Sustained Attention: Impaired Sustained Attention Impairment: Verbal basic Awareness: Impaired Awareness Impairment: Intellectual impairment    Extremity Assessment (includes Sensation/Coordination)  Upper Extremity Assessment: Generalized weakness RUE Deficits / Details: cuts/skin tears on dorsal portion of hand  Lower Extremity Assessment: Defer to PT evaluation    ADLs  Overall ADL's : Needs assistance/impaired Grooming: Wash/dry face, Minimal assistance, Sitting Grooming Details (indicate cue type and reason): Max A for standing balance while washing his face at sink. Mod cues for sequencing of washing his face. becoming distracted by wiping sink counter with wash clothe instead. Upper Body Bathing: Moderate assistance, Sitting Lower Body Bathing: Maximal assistance, Sitting/lateral leans Upper Body Dressing : Moderate assistance, Sitting Lower Body Dressing: Maximal assistance, Sit to/from stand Toilet Transfer: Maximal assistance (simulated at recliner) Toileting- Clothing Manipulation and Hygiene: Maximal assistance, Sitting/lateral lean Functional mobility during ADLs: Moderate assistance, Maximal assistance (sit<>stand) General ADL Comments: Pt with increased lethargy at begining of session. As he became more aroused, pt was able to particiapte more in activities. Pt performing sit<>stand at sink to wash his face.    Mobility  Overal bed mobility: Needs Assistance Bed Mobility: Supine to Sit, Sit to Supine Supine to sit: Min assist, +2 for physical assistance Sit to supine: Mod assist, +2 for physical assistance General bed mobility comments: patient initiating bringing LEs towards EOB and assist for trunk elevation    Transfers  Overall transfer level:  Needs assistance Equipment used: 2 person hand held assist Transfers: Sit to/from Stand Sit to Stand: Mod assist, +2 safety/equipment, From elevated surface Bed to/from chair/wheelchair/BSC transfer type:: Step pivot Step pivot transfers: Min assist, +2 safety/equipment General transfer comment: modA+2 to rise and steady. Encouragement required to complete    Ambulation / Gait / Stairs / Wheelchair Mobility  Ambulation/Gait Ambulation/Gait assistance: Mod assist, +2 safety/equipment Gait Distance (Feet): 15 Feet Assistive device: 2 person hand held assist Gait Pattern/deviations: Step-to pattern, Decreased stride length, Wide base of support, Shuffle General Gait Details: shuffling gait pattern with wide BOS. HHAx2 for safety and modA+2 for balance throughout. Max encouragement required to perform short mobility. Patient seems to have fear of falling Gait velocity: decreased Gait velocity interpretation: <1.31 ft/sec, indicative of household ambulator    Posture / Balance Dynamic Sitting Balance Sitting balance - Comments: Practicing bending forward with supervision. no LOB while picking item off the floor while seated Balance Overall balance assessment: Needs assistance Sitting-balance support: Feet  supported, No upper extremity supported Sitting balance-Leahy Scale: Fair Sitting balance - Comments: Practicing bending forward with supervision. no LOB while picking item off the floor while seated Postural control: Posterior lean Standing balance support: Bilateral upper extremity supported, No upper extremity supported, During functional activity Standing balance-Leahy Scale: Poor Standing balance comment: reliant on physical A and/or UE support    Special needs/care consideration Skin surgical incision, R hand abrasion,  and Behavioral consideration Pt. With mild dementia at baseline, increased confusion this admission.    Previous Home Environment (from acute therapy  documentation) Living Arrangements: Spouse/significant other  Lives With: Spouse Available Help at Discharge: Available 24 hours/day Type of Home: House Home Layout: One level Home Access: Ramped entrance Bathroom Shower/Tub: Multimedia programmer: Standard Bathroom Accessibility: Yes How Accessible: Accessible via walker Additional Comments: Per PT called spouse:  aide 3 days, 10-5 really as supervision and to take him out in the community. Pt has hearing aids and per son has always been very energetic and on the go. wife uses a RW and will not be able to physically assist.  Discharge Living Setting Plans for Discharge Living Setting: Patient's home Type of Home at Discharge: House Discharge Home Layout: One level Discharge Home Access: Ramped entrance Entrance Stairs-Rails: None Discharge Bathroom Shower/Tub: Walk-in shower, Evans unit Discharge Bathroom Toilet: Standard Discharge Bathroom Accessibility: Yes How Accessible: Accessible via walker  Social/Family/Support Systems Patient Roles: Spouse Contact Information: [phone] Anticipated Caregiver: june (wife) Ability/Limitations of Caregiver: Can provide light min A Caregiver Availability: 24/7 Discharge Plan Discussed with Primary Caregiver: Yes Is Caregiver In Agreement with Plan?: Yes  Goals Patient/Family Goal for Rehab: PT/OT Supervision Expected length of stay: 12-14 days Pt/Family Agrees to Admission and willing to participate: Yes Program Orientation Provided & Reviewed with Pt/Caregiver Including Roles  & Responsibilities: Yes  Decrease burden of Care through IP rehab admission: Specialzed equipment needs, Decrease number of caregivers, Bowel and bladder program, and Patient/family education  Possible need for SNF placement upon discharge: Not anticipated  Patient Condition: I have reviewed medical records from [facility], spoken with CM, and patient, spouse, and son. I met  with patient at the bedside and discussed via phone for inpatient rehabilitation assessment.  Patient will benefit from ongoing PT, OT, and SLP, can actively participate in 3 hours of therapy a day 5 days of the week, and can make measurable gains during the admission.  Patient will also benefit from the coordinated team approach during an Inpatient Acute Rehabilitation admission.  The patient will receive intensive therapy as well as Rehabilitation physician, nursing, social worker, and care management interventions.  Due to bladder management, bowel management, safety, skin/wound care, disease management, medication administration, pain management, and patient education the patient requires 24 hour a day rehabilitation nursing.  The patient is currently ModA-mod +2 with mobility and basic ADLs.  Discharge setting and therapy post discharge at home with home health is anticipated.  Patient has agreed to participate in the Acute Inpatient Rehabilitation Program and will admit today.  Preadmission Screen Completed By:  Retta Diones, 06/29/2021 3:20 PM ______________________________________________________________________   Discussed status with Dr. Ranell Patrick on 06/29/21 at 3:00 pm and received approval for admission today.  Admission Coordinator:  Retta Diones, RN, time 3:19 pm/Date 06/29/21   Assessment/Plan: Diagnosis: Left MCA CVA Does the need for close, 24 hr/day Medical supervision in concert with the patient's rehab needs make it unreasonable for this patient to be served in a less intensive  setting? Yes Co-Morbidities requiring supervision/potential complications: HTN, a fib, left rotator cuff tear Due to bladder management, bowel management, safety, skin/wound care, disease management, medication administration, pain management, and patient education, does the patient require 24 hr/day rehab nursing? Yes Does the patient require coordinated care of a physician, rehab nurse, PT, OT, and SLP  to address physical and functional deficits in the context of the above medical diagnosis(es)? Yes Addressing deficits in the following areas: balance, endurance, locomotion, strength, transferring, bowel/bladder control, bathing, dressing, feeding, grooming, toileting, and psychosocial support Can the patient actively participate in an intensive therapy program of at least 3 hrs of therapy 5 days a week? Yes The potential for patient to make measurable gains while on inpatient rehab is excellent Anticipated functional outcomes upon discharge from inpatient rehab: supervision PT, supervision OT, n/a SLP Estimated rehab length of stay to reach the above functional goals is: 12-14 days Anticipated discharge destination: Home 10. Overall Rehab/Functional Prognosis: excellent   MD Signature: Meredith Staggers, MD, [place] Director Rehabilitation Services 06/29/2021

## 2021-06-25 NOTE — Progress Notes (Addendum)
STROKE TEAM PROGRESS NOTE    INTERVAL HISTORY Patient is seen in his room with his son at the bedside.  He is more drowsy and confused today, but neurological exam shows no focal deficits.  Likely has some delirium. He has been hemodynamically stable and has had no acute events overnight.  MRI yesterday reveals left MCA infarct in posterior left frontoparietal region. Vitals:   06/24/21 2106 06/24/21 2326 06/25/21 0318 06/25/21 1118  BP:  (!) 152/107 (!) 134/98 (!) 159/91  Pulse:  94 91 99  Resp:  15 18 13   Temp: (!) 97.3 F (36.3 C) 98.1 F (36.7 C) 98.2 F (36.8 C) 98.7 F (37.1 C)  TempSrc: Oral Axillary Axillary Oral  SpO2:  100% 97% 98%  Height:       CBC:  Recent Labs  Lab 06/19/21 1207 06/19/21 1710 06/23/21 0941 06/24/21 0317  WBC 6.7   < > 7.4 7.5  NEUTROABS 5.1  --   --   --   HGB 12.7*   < > 10.2* 10.3*  HCT 39.1   < > 31.4* 32.4*  MCV 94.7   < > 92.1 93.4  PLT 172   < > 126* 121*   < > = values in this interval not displayed.    Basic Metabolic Panel:  Recent Labs  Lab 06/24/21 0317 06/25/21 0254  NA 143 140  K 3.3* 3.3*  CL 114* 109  CO2 20* 22  GLUCOSE 87 97  BUN 22 16  CREATININE 1.04 0.98  CALCIUM 7.4* 8.2*    Lipid Panel:  Recent Labs  Lab 06/20/21 0359  CHOL 198  TRIG 44  HDL 79  CHOLHDL 2.5  VLDL 9  LDLCALC 110*    HgbA1c:  Recent Labs  Lab 06/20/21 0359  HGBA1C 5.3    Urine Drug Screen:  Recent Labs  Lab 06/19/21 1315  LABOPIA NONE DETECTED  COCAINSCRNUR NONE DETECTED  LABBENZ POSITIVE*  AMPHETMU NONE DETECTED  THCU NONE DETECTED  LABBARB NONE DETECTED     Alcohol Level  Recent Labs  Lab 06/19/21 1207  ETH <10     IMAGING past 24 hours MR BRAIN WO CONTRAST  Result Date: 06/24/2021 CLINICAL DATA:  Follow-up examination for acute stroke. EXAM: MRI HEAD WITHOUT CONTRAST TECHNIQUE: Multiplanar, multiecho pulse sequences of the brain and surrounding structures were obtained without intravenous contrast.  COMPARISON:  Multiple previous CTs dating back to 06/19/2021. FINDINGS: Brain: Diffuse prominence of the CSF containing spaces compatible generalized cerebral atrophy. Confluent T2/FLAIR hyperintensity involving the periventricular and deep white matter both cerebral hemispheres consistent with chronic microvascular ischemic disease, moderately advanced in nature. Scattered restricted diffusion involving the posterior left frontoparietal region consistent with acute left MCA distribution infarct (series 2, image 31). Involvement is predominantly cortical in nature, although there is some subcortical involvement at the frontal lobe anteriorly. No associated hemorrhage or mass effect. No other evidence for acute or subacute ischemia. Gray-white matter differentiation otherwise maintained. No other areas of chronic infarction. No acute or chronic intracranial hemorrhage. No mass lesion, mass effect, or midline shift. Ventricular prominence related to global parenchymal volume loss without hydrocephalus. No extra-axial fluid collection. Vascular: Hypoplastic right vertebral artery not visualized, consistent with previously identified occlusion. Major intracranial vascular flow voids otherwise maintained. Skull and upper cervical spine: Craniocervical junction within normal limits. Bone marrow signal intensity normal. No scalp soft tissue abnormality. Sinuses/Orbits: Prior bilateral ocular lens replacement. Paranasal sinuses are largely clear. No significant mastoid effusion. Other: None. IMPRESSION: 1.  Acute ischemic nonhemorrhagic left MCA distribution infarct involving the posterior left frontoparietal region. No associated hemorrhage or mass effect. 2. Underlying age-related cerebral atrophy with moderate chronic microvascular ischemic disease. Electronically Signed   By: Jeannine Boga M.D.   On: 06/24/2021 21:52    PHYSICAL EXAM General:  Patient is a pleasantly confused, well-developed male in no acute  distress.   NEURO:  Mental Status: Patient is drowsy but arouses easily to voice and touch. He is unable to state his name or location.  He will intermittently follow commands and is able to name some objects but not others.  Left sided facial droop continues to be present.  Patient can move all extremities with good strength and is able to stand with assistance.    ASSESSMENT/PLAN Mr. Dwain Merlan. is a 87 y.o. male with history of HTN, atrial fibrillation not on anticoagulation and mild dementia presenting with aphasia and right sided weakness. He was found to have an acute left MCA M2 occlusion with a large mismatch on CT perfusion.  He was taken to IR for mechanical thrombectomy. TICI 3 flow was achieved. He has been off Cleviprex, and his agitation has improved.  Brain MRI shows left MCA infarct in posterior left frontoparietal region.  Stroke team will sign off but will remain available for any questions or concerns.  Stroke: left MCA infarct s/p IR with IR with TICI3, eombolic likely secondary due to afib not on [facility]  Code Stroke CT head No acute abnormality. Chronic microvascular disease. ASPECTS 10.    CTA head & neck Less than 50% stenosis of right and left carotid arteries, distal left M2 MCA occlusion CT perfusion 90 mL of penumbra in left MCA territory s/p IR with TICI3 of left M2 MCA CT repeat no acute infarct, no bleeding MRI left MCA infarct in posterior left frontoparietal region 2D Echo EF 45%.  Global hypokinesis.  Dilated left atrium.  LDL 110 HgbA1c 5.3 VTE prophylaxis - lovenox No antithrombotic prior to admission, now on eliquis 5mg  bid.  Therapy recommendations:  CIR Disposition:  pending  Atrial fibrillation Not on AC PTA Was on lovenox bid due to agitation and NPO Now passed swallow On eliquis now  Post op delirium/confusion likely due to sundowning from cognitive  impairment/dementia at baseline On seroquel bid and depakote 125 Q8h Continue  Namenda  Hypertension Home meds:  metoprolol 25 mg daily, continued in hospital Stable No longer requiring Cleviprex Long-term BP goal normotensive  Hyperlipidemia Home meds:  none LDL 110, goal < 70 On Atorvastatin 40 mg  Continue statin at discharge  Other Stroke Risk Factors Advanced Age >/= 76   Other Active Problems Urinary retention  Hospital day # [place] , MSN, AGACNP-BC [facility] See Amion for schedule and pager information 06/25/2021 2:42 PM   ATTENDING NOTE: I reviewed above note and agree with the assessment and plan. Pt was seen and examined.   No acute event overnight, patient neuro stable.  MRI showed left MCA scattered small infarcts, as expected.  Tolerating Eliquis so far.  Continue Eliquis and statin for stroke prevention.  Delirium management per primary team.  For detailed assessment and plan, please refer to above as I have made changes wherever appropriate.   Neurology will sign off. Please call with questions. Pt will follow up with stroke clinic NP at [facility] in about 4 weeks. Thanks for the consult.   Rosalin Hawking, MD PhD Stroke Neurology 06/25/2021 3:07  PM    To contact Stroke Continuity provider, please refer to [email or web address] After hours, contact General Neurology.

## 2021-06-25 NOTE — Progress Notes (Signed)
Palliative Medicine Inpatient Follow Up Note  Reason for Consultation: Establishing goals of care   HPI/Patient Profile: 87 y.o. male  with past medical history of htn, hld, dysphagia, AKI, GERD, and a fib admitted on 06/19/2021 with CVA. Had mechanical thrombectomy. Since that time has had agitation and restlessness.  PMT consulted to discuss GOC.    Today's Discussion (06/25/2021):  *Please note that this is a verbal dictation therefore any spelling or grammatical errors are due to the "Dragon [employer] One" system interpretation.  Chart reviewed. HR remains incrementally elevated.  Patient is resting when I stopped by this morning. He is no distress and appears comfortable.   I spoke to patients RN, Domenic Schwab. He shares that Jayvin was able to get up and walk well yesterday. He expressed that the patient for the most part did well with the exception of medication passages which seem to cause some frustration.  __________________________________________________ Addendum:  I met with Renardo and his son, Onalee Hua at bedside.   Godson was more alert this afternoon. He was having a touch time getting his words out at times d/t aphasia though was jovial.   Reviewed patients improvements. Onalee Hua expresses the hope for continued improvement. Reviewed the hope for improvements and the alternative side if improvements don't occur. Onalee Hua is aware of this. He is hopeful that CIR can help get his father towards a more improved trajectory. __________________________________________________ Addendum 2:  Dr. Sharl Ma shares patients son would like him to be DNR. I went by the room and spoke to [place]. I shared that often the patient or his he is unable to make decisions his next of kin would be the decision maker. Patients decision maker in this case is his spouse, June.   Reviewed that a DNR does not mean do not treat. It indicates that a person would not want cardiopulmonary resuscitation if they were in  cardiac arrest.  Onalee Hua asked for some time to speak with his mother prior to additional conversations being had with PMT.  Time In: 1330 Time Out: 1348 Total Time: 18 additional minutes  Questions and concerns addressed. Palliative support provided.  Objective Assessment: Vital Signs Vitals:   06/24/21 2326 06/25/21 0318  BP: (!) 152/107 (!) 134/98  Pulse: 94 91  Resp: 15 18  Temp: 98.1 F (36.7 C) 98.2 F (36.8 C)  SpO2: 100% 97%    Intake/Output Summary (Last 24 hours) at 06/25/2021 1239 Last data filed at 06/25/2021 0100 Gross per 24 hour  Intake 120 ml  Output 2305 ml  Net -2185 ml    Gen:  Older caucasian M in NAD HEENT: moist mucous membranes CV: Irregular rate and rhythm  PULM:  On RA ABD: soft/nontender  EXT: No edema  Neuro: Alert and oriented x1  SUMMARY OF RECOMMENDATIONS   - Continue full code/full scope care   - PT/OT/Speech  - Delirium precautions  - Ongoing incremental Palliative support  - Appears improvements are being made  - Dispo plan appears to be for CIR if insurance authorizes   Time Spent: 25 Greater than 50% of the time was spent in counseling and coordination of care ______________________________________________________________________________________ Lamarr Lulas [place] Palliative Medicine Team Team Cell Phone: [phone] Please utilize secure chat with additional questions, if there is no response within 30 minutes please call the above phone number  Palliative Medicine Team providers are available by phone from 7am to 7pm daily and can be reached through the team cell phone.  Should this patient require assistance  outside of these hours, please call the patient's attending physician.

## 2021-06-25 NOTE — Progress Notes (Signed)
[facility]  PROGRESS NOTE  Devon Elliott. BK:[id number] DOB: January 18, 1927 DOA: 06/19/2021 PCP: Rusty Aus, MD   Brief HPI:   87 year old male with history of hypertension, hyperlipidemia, atrial fibrillation not on anticoagulation presented to [facility] on 12/5 with aphasia and right-sided weakness.  CTA showed distal left M2 occlusion.  Patient was brought to [facility] for neuro intervention radiology mechanical thrombectomy.  Patient did not receive tPA as he was deemed to be outside of the window. Patient underwent mechanical thrombectomy per IR, after the procedure he became agitated and restless.  He received diphenhydramine and Haldol with Ativan.  Patient became somnolent with mild desaturation into 80s on 3 L of oxygen.  Mental status was depressed, he was minimally responsive to noxious stimuli.  PCCM was subsequently consulted for airway protection. Patient received Romazicon for reversal He was transferred out of ICU [facility]  assumed care on 06/23/2021   Subjective   Today patient is confused, son at bedside.   Assessment/Plan:    Left middle cerebral artery infarct -Secondary to embolism from atrial fibrillation -S/p mechanical thrombectomy of left MCA M2 occlusion -Excellent revascularization achieved -Patient started on heparin GTT due to atrial fibrillation -Heparin was changed to Lovenox which was changed to  p.o. Eliquis -Continue aspirin  Delirium -Developed after thrombectomy -Fluctuating course -Was in the ICU, required Precedex, he has been weaned off Precedex -Patient did receive Romazicon for reversal of benzodiazepines -Started on Depakote 125 mg p.o. every 8 hours; continue Seroquel 25 mg p.o. nightly -Continue delirium precautions  Acute kidney injury -Creatinine was 1.52 today; started on IV normal saline at 100 mL/h -IV fluids stopped -Acute kidney injury has resolved, today creatinine is 1.04  Hypokalemia -Potassium is still 3.3 despite  replacement with potassium -Check serum magnesium -Give additional dose of K-Dur 40 mEq p.o. x1  Acute hypoxemic respiratory failure - multifactorial; pulmonary edema, atelectasis -Resolved -Continue I-S, flutter valve  Atrial fibrillation -Heart rate controlled, continue metoprolol 25 mg p.o. twice daily -Started on heparin GTT  HFrEF -Currently euvolemic -Received IV fluids briefly as above -He is off IV fluids now  Hypertension -Blood pressure is elevated -Continue p.o. metoprolol, amlodipine  Goals of care -Discussed with patient and son at bedside, he understands that patient may not do well.  He did not want any aggressive interventions in case patient confusion does not improve and has poor p.o. intake.  We will continue to monitor over the next few days.  Palliative care following.  Medications     apixaban  5 mg Oral BID   atorvastatin  40 mg Oral Daily   Chlorhexidine Gluconate Cloth  6 each Topical Daily   divalproex  125 mg Oral Q8H   memantine  5 mg Oral q morning   metoprolol tartrate  25 mg Oral BID   mupirocin ointment  1 application Nasal BID   QUEtiapine  25 mg Oral QHS   QUEtiapine  25 mg Oral Q0600     Data Reviewed:   CBG:  No results for input(s): GLUCAP in the last 168 hours.  SpO2: 97 % O2 Flow Rate (L/min): 5 L/min FiO2 (%): 100 %    Vitals:   06/24/21 2050 06/24/21 2106 06/24/21 2326 06/25/21 0318  BP: (!) 148/88  (!) 152/107 (!) 134/98  Pulse: (!) 110  94 91  Resp:   15 18  Temp: (!) 97.3 F (36.3 C) (!) 97.3 F (36.3 C) 98.1 F (36.7 C) 98.2 F (36.8 C)  TempSrc: Oral Oral Axillary Axillary  SpO2: 94%  100% 97%  Height:         Intake/Output Summary (Last 24 hours) at 06/25/2021 1203 Last data filed at 06/25/2021 0100 Gross per 24 hour  Intake 120 ml  Output 1455 ml  Net -1335 ml    12/09 1901 - 12/11 0700 In: 638.7 [P.O.:120; I.V.:518.7] Out: 2955 [Urine:2955]  There were no vitals filed for this visit.  Data  Reviewed: Basic Metabolic Panel: Recent Labs  Lab 06/21/21 0323 06/22/21 0326 06/23/21 0941 06/24/21 0317 06/25/21 0254  NA 142 139 143 143 140  K 4.0 3.5 3.6 3.3* 3.3*  CL 112* 109 114* 114* 109  CO2 21* 19* 22 20* 22  GLUCOSE 106* 153* 99 87 97  BUN 20 25* 33* 22 16  CREATININE 1.20 1.52* 1.52* 1.04 0.98  CALCIUM 7.7* 7.9* 7.5* 7.4* 8.2*   Liver Function Tests: Recent Labs  Lab 06/19/21 1207  AST 21  ALT 10  ALKPHOS 56  BILITOT 0.6  PROT 6.3*  ALBUMIN 3.8   No results for input(s): LIPASE, AMYLASE in the last 168 hours. No results for input(s): AMMONIA in the last 168 hours. CBC: Recent Labs  Lab 06/19/21 1207 06/19/21 1710 06/21/21 0323 06/22/21 0326 06/23/21 0941 06/24/21 0317  WBC 6.7 9.3 11.1* 12.2* 7.4 7.5  NEUTROABS 5.1  --   --   --   --   --   HGB 12.7* 12.2* 11.2* 11.8* 10.2* 10.3*  HCT 39.1 37.0* 35.3* 36.7* 31.4* 32.4*  MCV 94.7 93.0 94.4 92.7 92.1 93.4  PLT 172 167 161 159 126* 121*   Cardiac Enzymes: No results for input(s): CKTOTAL, CKMB, CKMBINDEX, TROPONINI in the last 168 hours. BNP (last 3 results) No results for input(s): BNP in the last 8760 hours.  ProBNP (last 3 results) No results for input(s): PROBNP in the last 8760 hours.  CBG: No results for input(s): GLUCAP in the last 168 hours.     Radiology Reports  MR BRAIN WO CONTRAST  Result Date: 06/24/2021 CLINICAL DATA:  Follow-up examination for acute stroke. EXAM: MRI HEAD WITHOUT CONTRAST TECHNIQUE: Multiplanar, multiecho pulse sequences of the brain and surrounding structures were obtained without intravenous contrast. COMPARISON:  Multiple previous CTs dating back to 06/19/2021. FINDINGS: Brain: Diffuse prominence of the CSF containing spaces compatible generalized cerebral atrophy. Confluent T2/FLAIR hyperintensity involving the periventricular and deep white matter both cerebral hemispheres consistent with chronic microvascular ischemic disease, moderately advanced in  nature. Scattered restricted diffusion involving the posterior left frontoparietal region consistent with acute left MCA distribution infarct (series 2, image 31). Involvement is predominantly cortical in nature, although there is some subcortical involvement at the frontal lobe anteriorly. No associated hemorrhage or mass effect. No other evidence for acute or subacute ischemia. Gray-white matter differentiation otherwise maintained. No other areas of chronic infarction. No acute or chronic intracranial hemorrhage. No mass lesion, mass effect, or midline shift. Ventricular prominence related to global parenchymal volume loss without hydrocephalus. No extra-axial fluid collection. Vascular: Hypoplastic right vertebral artery not visualized, consistent with previously identified occlusion. Major intracranial vascular flow voids otherwise maintained. Skull and upper cervical spine: Craniocervical junction within normal limits. Bone marrow signal intensity normal. No scalp soft tissue abnormality. Sinuses/Orbits: Prior bilateral ocular lens replacement. Paranasal sinuses are largely clear. No significant mastoid effusion. Other: None. IMPRESSION: 1. Acute ischemic nonhemorrhagic left MCA distribution infarct involving the posterior left frontoparietal region. No associated hemorrhage or mass effect. 2. Underlying age-related cerebral atrophy with  moderate chronic microvascular ischemic disease. Electronically Signed   By: Jeannine Boga M.D.   On: 06/24/2021 21:52       Antibiotics: Anti-infectives (From admission, onward)    None         DVT prophylaxis: Lovenox  Code Status: Full code  Family Communication: Discussed with patient's son at bedside   Consultants: Neurology PCCM  Procedures:     Objective    Physical Examination:  General-appears in no acute distress Heart-S1-S2, regular, no murmur auscultated Lungs-clear to auscultation bilaterally, no wheezing or crackles  auscultated Abdomen-soft, nontender, no organomegaly Extremities-no edema in the lower extremities Neuro-alert, oriented to self only, confused  Status is: Inpatient  Dispo: The patient is from: Home              Anticipated d/c is to: Skilled nursing facility              Anticipated d/c date is: 06/27/2021              Patient currently not stable for discharge  Barrier to discharge-delirium  COVID-19 Labs  No results for input(s): DDIMER, FERRITIN, LDH, CRP in the last 72 hours.  Lab Results  Component Value Date   SARSCOV2NAA NEGATIVE 06/19/2021   [place] NEGATIVE 04/30/2020   [place] NEGATIVE 07/29/2019            Recent Results (from the past 240 hour(s))  Resp Panel by RT-PCR (Flu A&B, Covid) Nasopharyngeal Swab     Status: None   Collection Time: 06/19/21 12:07 PM   Specimen: Nasopharyngeal Swab; Nasopharyngeal(NP) swabs in vial transport medium  Result Value Ref Range Status   SARS Coronavirus 2 by RT PCR NEGATIVE NEGATIVE Final    Comment: (NOTE) SARS-CoV-2 target nucleic acids are NOT DETECTED.  The SARS-CoV-2 RNA is generally detectable in upper respiratory specimens during the acute phase of infection. The lowest concentration of SARS-CoV-2 viral copies this assay can detect is 138 copies/mL. A negative result does not preclude SARS-Cov-2 infection and should not be used as the sole basis for treatment or other patient management decisions. A negative result may occur with  improper specimen collection/handling, submission of specimen other than nasopharyngeal swab, presence of viral mutation(s) within the areas targeted by this assay, and inadequate number of viral copies(<138 copies/mL). A negative result must be combined with clinical observations, patient history, and epidemiological information. The expected result is Negative.  Fact Sheet for Patients:  EntrepreneurPulse.com.au  Fact Sheet for Healthcare Providers:   IncredibleEmployment.be  This test is no t yet approved or cleared by the [place] FDA and  has been authorized for detection and/or diagnosis of SARS-CoV-2 by FDA under an Emergency Use Authorization (EUA). This EUA will remain  in effect (meaning this test can be used) for the duration of the COVID-19 declaration under Section 564(b)(1) of the Act, 21 U.S.C.section 360bbb-3(b)(1), unless the authorization is terminated  or revoked sooner.       Influenza A by PCR NEGATIVE NEGATIVE Final   Influenza B by PCR NEGATIVE NEGATIVE Final    Comment: (NOTE) The Xpert Xpress SARS-CoV-2/FLU/RSV plus assay is intended as an aid in the diagnosis of influenza from Nasopharyngeal swab specimens and should not be used as a sole basis for treatment. Nasal washings and aspirates are unacceptable for Xpert Xpress SARS-CoV-2/FLU/RSV testing.  Fact Sheet for Patients: EntrepreneurPulse.com.au  Fact Sheet for Healthcare Providers: IncredibleEmployment.be  This test is not yet approved or cleared by the [place] FDA and has  been authorized for detection and/or diagnosis of SARS-CoV-2 by FDA under an Emergency Use Authorization (EUA). This EUA will remain in effect (meaning this test can be used) for the duration of the COVID-19 declaration under Section 564(b)(1) of the Act, 21 U.S.C. section 360bbb-3(b)(1), unless the authorization is terminated or revoked.  Performed at [facility], [address], [place], [place] [address]   Surgical PCR screen     Status: None   Collection Time: 06/22/21  6:56 PM   Specimen: Nasal Mucosa; Nasal Swab  Result Value Ref Range Status   MRSA, PCR NEGATIVE NEGATIVE Final   Staphylococcus aureus NEGATIVE NEGATIVE Final    Comment: (NOTE) The Xpert SA Assay (FDA approved for NASAL specimens in patients 29 years of age and older), is one component of a comprehensive surveillance  program. It is not intended to diagnose infection nor to guide or monitor treatment. Performed at [facility], [address] [place], [place] [address]     Meredeth Ide   [facility] If 7PM-7AM, please contact night-coverage at [email or web address], Office  [phone]   06/25/2021, 12:03 PM  LOS: 6 days

## 2021-06-26 DIAGNOSIS — Z66 Do not resuscitate: Secondary | ICD-10-CM

## 2021-06-26 DIAGNOSIS — R4701 Aphasia: Secondary | ICD-10-CM

## 2021-06-26 DIAGNOSIS — R531 Weakness: Secondary | ICD-10-CM

## 2021-06-26 LAB — BASIC METABOLIC PANEL
Anion gap: 12 (ref 5–15)
BUN: 27 mg/dL — ABNORMAL HIGH (ref 8–23)
CO2: 20 mmol/L — ABNORMAL LOW (ref 22–32)
Calcium: 8.3 mg/dL — ABNORMAL LOW (ref 8.9–10.3)
Chloride: 109 mmol/L (ref 98–111)
Creatinine, Ser: 1.25 mg/dL — ABNORMAL HIGH (ref 0.61–1.24)
GFR, Estimated: 54 mL/min — ABNORMAL LOW (ref >60)
Glucose, Bld: 176 mg/dL — ABNORMAL HIGH (ref 70–99)
Potassium: 4.1 mmol/L (ref 3.5–5.1)
Sodium: 141 mmol/L (ref 135–145)

## 2021-06-26 LAB — MAGNESIUM: Magnesium: 1.6 mg/dL — ABNORMAL LOW (ref 1.7–2.4)

## 2021-06-26 MED ORDER — PANTOPRAZOLE SODIUM 40 MG PO TBEC
40.0000 mg | DELAYED_RELEASE_TABLET | Freq: Every day | ORAL | Status: DC
  Administered 2021-06-26 – 2021-06-29 (×4): 40 mg via ORAL
  Filled 2021-06-26 (×3): qty 1

## 2021-06-26 MED ORDER — MAGNESIUM SULFATE 2 GM/50ML IV SOLN
2.0000 g | Freq: Once | INTRAVENOUS | Status: AC
  Administered 2021-06-26: 2 g via INTRAVENOUS
  Filled 2021-06-26: qty 50

## 2021-06-26 NOTE — Progress Notes (Signed)
Occupational Therapy Treatment Patient Details Name: Devon Elliott. MRN: [id number] DOB: Feb 17, 1927 Today's Date: 06/26/2021   History of present illness 87 yo male admitted 12/5 with Lt MCA CVA s/p thrombectomy. Pt with agitation/restlessness post op, he has been getting Seroquel to manage. PMhx: dementia, HLD, HTN, dysphagia, AKI, Afib   OT comments  Pt progressing towards established OT goals. However, pt with increased lethargy upon arrival - lethargy decreased as session progressed. Pt washing his face while standing at sink with Mod-Max A for standing balance and Mod cues for sequencing. Pt also participating in forward bending (while seated) activity; close supervision for safety. Pt's wife and caregiver present throughout session. Continue to recommend dc to CIR and will continue to follow acutely as admitted.   Recommendations for follow up therapy are one component of a multi-disciplinary discharge planning process, led by the attending physician.  Recommendations may be updated based on patient status, additional functional criteria and insurance authorization.    Follow Up Recommendations  Acute inpatient rehab (3hours/day)    Assistance Recommended at Discharge Frequent or constant Supervision/Assistance  Equipment Recommendations  BSC/3in1;Tub/shower seat    Recommendations for Other Services Rehab consult    Precautions / Restrictions Precautions Precautions: Fall;Other (comment) Precaution Comments: SBP<200       Mobility Bed Mobility Overal bed mobility: Needs Assistance       Supine to sit: Mod assist     General bed mobility comments: In recliner upon arrival    Transfers Overall transfer level: Needs assistance Equipment used: Rolling walker (2 wheels) Transfers: Sit to/from Stand Sit to Stand: Max assist;Mod assist           General transfer comment: Mod A for initating power up. Max A for gaining balance once in standing     Balance  Overall balance assessment: Needs assistance Sitting-balance support: Feet supported;No upper extremity supported Sitting balance-Leahy Scale: Good Sitting balance - Comments: Practicing bending forward with supervision. no LOB while picking item off the floor while seated   Standing balance support: Bilateral upper extremity supported;No upper extremity supported;During functional activity Standing balance-Leahy Scale: Poor Standing balance comment: reliant on physical A and/or UE support                           ADL either performed or assessed with clinical judgement   ADL Overall ADL's : Needs assistance/impaired     Grooming: Wash/dry face;Maximal assistance;Standing Grooming Details (indicate cue type and reason): Max A for standing balance while washing his face at sink. Mod cues for sequencing of washing his face. becoming distracted by wiping sink counter with wash clothe instead.                 Toilet Transfer: Maximal assistance (simulated at recliner)           Functional mobility during ADLs: Moderate assistance;Maximal assistance (sit<>stand) General ADL Comments: Pt with increased lethargy at begining of session. As he became more aroused, pt was able to particiapte more in activities. Pt performing sit<>stand at sink to wash his face.    Extremity/Trunk Assessment Upper Extremity Assessment Upper Extremity Assessment: Generalized weakness;RUE deficits/detail RUE Deficits / Details: cuts/skin tears on dorsal portion of hand   Lower Extremity Assessment Lower Extremity Assessment: Defer to PT evaluation        Vision       Perception     Praxis      Cognition Arousal/Alertness: Lethargic Behavior  During Therapy: WFL for tasks assessed/performed Overall Cognitive Status: Impaired/Different from baseline Area of Impairment: Orientation;Attention;Memory;Following commands;Safety/judgement;Awareness;Problem solving                  Orientation Level: Disoriented to;Time;Place;Situation Current Attention Level: Focused;Sustained Memory: Decreased short-term memory Following Commands: Follows one step commands inconsistently;Follows one step commands with increased time Safety/Judgement: Decreased awareness of safety;Decreased awareness of deficits Awareness: Intellectual Problem Solving: Requires verbal cues;Requires tactile cues;Slow processing;Decreased initiation;Difficulty sequencing General Comments: lethargic today. very flat at first. Requiring increased time and cues at begining of session. by end of session, pt able to maintain conversation and eye contact. Becoming anxious about certain movements such as leaning forward before standing - but then later in session pt bending forward to reach for wash clothe on floor          Exercises Exercises: Other exercises General Exercises - Lower Extremity Ankle Circles/Pumps: AAROM;Both;15 reps;Seated Long Arc Quad: AAROM;Both;15 reps;Seated Hip Flexion/Marching: AAROM;Both;15 reps;Seated Other Exercises Other Exercises: Challenging activity tolerance and sitting balance with bending forward to pick up wash cloth at feet; x3   Shoulder Instructions       General Comments SpO2 stable on 2L O2. Wife and caregiver present during session    Pertinent Vitals/ Pain       Pain Assessment: No/denies pain  Home Living                                          Prior Functioning/Environment              Frequency  Min 2X/week        Progress Toward Goals  OT Goals(current goals can now be found in the care plan section)  Progress towards OT goals: Progressing toward goals  Acute Rehab OT Goals OT Goal Formulation: Patient unable to participate in goal setting Time For Goal Achievement: 07/06/21 Potential to Achieve Goals: Good ADL Goals Pt Will Perform Grooming: with supervision;sitting Pt Will Perform Upper Body Bathing: with  supervision;sitting Pt Will Perform Upper Body Dressing: with supervision;sitting Pt Will Transfer to Toilet: with min assist;ambulating;regular height toilet;bedside commode Pt Will Perform Toileting - Clothing Manipulation and hygiene: with min guard assist;sitting/lateral leans Pt/caregiver will Perform Home Exercise Program: Increased strength;Both right and left upper extremity;With theraband;With minimal assist;With written HEP provided  Plan Discharge plan remains appropriate    Co-evaluation                 AM-PAC OT "6 Clicks" Daily Activity     Outcome Measure   Help from another person eating meals?: A Lot Help from another person taking care of personal grooming?: A Lot Help from another person toileting, which includes using toliet, bedpan, or urinal?: A Lot Help from another person bathing (including washing, rinsing, drying)?: A Lot Help from another person to put on and taking off regular upper body clothing?: A Lot Help from another person to put on and taking off regular lower body clothing?: Total 6 Click Score: 11    End of Session Equipment Utilized During Treatment: Gait belt;Oxygen  OT Visit Diagnosis: Unsteadiness on feet (R26.81);Other abnormalities of gait and mobility (R26.89);Muscle weakness (generalized) (M62.81);Other symptoms and signs involving cognitive function   Activity Tolerance Patient limited by lethargy   Patient Left in chair;with call bell/phone within reach;with chair alarm set;with family/visitor present;Other (comment) (with NP)   Nurse Communication Mobility  status        Time: 1138-1207 OT Time Calculation (min): 29 min  Charges: OT General Charges $OT Visit: 1 Visit OT Treatments $Self Care/Home Management : 23-37 mins  Margo Lama MSOT, OTR/L Acute Rehab Pager: [phone] Office: [phone]  Theodoro Grist Daishia Fetterly 06/26/2021, 12:32 PM

## 2021-06-26 NOTE — Progress Notes (Signed)
Inpatient Rehab Admissions Coordinator:    I do not have a bed on CIR for this Pt. Today. I will begin insurance auth once PT and OT notes are in.  Megan Salon, MS, CCC-SLP Rehab Admissions Coordinator  [phone] (celll) [phone] (office)

## 2021-06-26 NOTE — Progress Notes (Signed)
Palliative Care Progress Note, Assessment & Plan   Patient Name: Devon Elliott.       Date: 06/26/2021 DOB: 12/31/1926  Age: 87 y.o. MRN#: [id number] Attending Physician: Oswald Hillock, MD Primary Care Physician: Rusty Aus, MD Admit Date: 06/19/2021  Reason for Consultation/Follow-up: Establishing goals of care  When I rounded on the patient this morning, he was out of bed and walking with his walker with physical therapy. No discussion had during this initial visit and I plan to round on him later today.  After my initial visit I spoke with the patient's Gearldine Elliott over the phone. He shared that patient's wife will be at bedside this afternoon.    Nursing notified me when patient's wife was at bedside and I returned to bedside to discuss advance care planning.  Patient is out of bed and sitting in recliner.  He has just completed a occupational therapy session wherein he walked to the bathroom and washed his face with minimal assistance.  He seems tired but is alert. His wife June and caregiver Devon Elliott are at bedside.  HPI: Patient is a 87 year old male with a past medical history significant for HTN, HLD, A. fib (no anticoagulant (who presented to [facility] on 12/5 with aphasia and right-sided weakness.  CTA revealed distal left M2 occlusion.  Patient was transferred to [facility] and did not receive tPA as he was deemed to be outside of the window.  Mechanical thrombectomy performed by IR. Episodes of agitation occurred s/p procedure but pt is more alert and calm today.  PMT was consulted to discuss Page.   Plan of Care: I have reviewed medical records including EPIC notes, labs and imaging, received report from nursing, spoke with patient's step son Devon Elliott over the phone, assessed the patient  and then met with patient and his wife to discuss diagnosis prognosis, GOC, EOL wishes, disposition, and options.  I attempted to elicit values and goals of care important to the patient.  Wife shares that she and patient have never discussed advance care planning or wishes regarding mechanical ventilation or intubation.  The difference between aggressive medical intervention and comfort care was considered in light of the patient's goals of care. Advance directives, concepts specific to code status, artificial feeding and hydration, and rehospitalization were considered and discussed.    Risk and benefits of CPR reviewed with patient and patient's wife.  Patient's wife shared she would never want him to live in a vegetative state.  She asked appropriate questions.  Therapeutic silence and space provided for patient and patient's wife to share their emotions and thoughts regarding his CODE STATUS.  Wife shares that it would be too confusing for him to try to make that decision right now.  She shared she thinks it is in his best interest to move forward with allowing a natural death and elected DNR for the patient.  MOST form also completed.   Discussed with patient/family the importance of continued conversation with family and the medical providers regarding overall plan of care and treatment options, ensuring decisions are within the context of the patient's values and GOCs.    Questions and concerns were addressed. The family was  encouraged to call with questions or concerns.   Care plan was discussed with patient, patient's wife, patient's caregiver Cassandra, patient's Gearldine Elliott, Dr. Darrick Meigs, nursing  Code Status: DNR  Prognosis:  Unable to determine  Discharge Planning: To Be Determined  Recommendations/Plan: Code status changed to DNR MOST completed Treat the treatable Continue with PT/OT and plan for rehab  Physical Exam Constitutional:      Appearance: Normal appearance.   HENT:     Head: Normocephalic and atraumatic.     Mouth/Throat:     Mouth: Mucous membranes are moist.  Cardiovascular:     Rate and Rhythm: Normal rate.     Pulses: Normal pulses.  Pulmonary:     Effort: Pulmonary effort is normal.  Abdominal:     Palpations: Abdomen is soft.  Musculoskeletal:     Comments: Generalized weakness  Skin:    General: Skin is warm and dry.  Neurological:     Mental Status: He is alert.     Comments: Oriented to self but not to place, time, situation  Psychiatric:        Mood and Affect: Mood normal.        Judgment: Judgment normal.                Palliative Assessment/Data: 60%    Flowsheet Rows    Flowsheet Row Most Recent Value  Intake Tab   Referral Department Critical care  Unit at Time of Referral ICU  Palliative Care Primary Diagnosis Neurology  Date Notified 06/21/21  Palliative Care Type New Palliative care  Reason for referral Clarify Goals of Care  Date of Admission 06/19/21  Date first seen by Palliative Care 06/21/21  # of days Palliative referral response time 0 Day(s)  # of days IP prior to Palliative referral 2  Clinical Assessment   Psychosocial & Spiritual Assessment   Palliative Care Outcomes         Total Time 35 minutes  Greater than 50%  of this time was spent counseling and coordinating care related to the above assessment and plan.  Thank you for allowing the Palliative Medicine Team to assist in the care of this patient.  [facility] Ilsa Iha, FNP-BC Palliative Medicine Team Team Phone # [phone]

## 2021-06-26 NOTE — TOC Initial Note (Signed)
Transition of Care (TOC) - Initial/Assessment Note  Donn Pierini RN,BSN Transitions of Care Unit 4NP (Non Trauma)- RN Case Manager See Treatment Team for direct Phone #    Patient Details  Name: Devon Elliott. MRN: [id number] Date of Birth: 1926-12-06  Transition of Care [facility]) CM/SW Contact:    Darrold Span, RN Phone Number: 06/26/2021, 11:42 AM  Clinical Narrative:                 Pt admitted from home with acute ischemic stroke. Noted CIR following for possible admit, Insurance auth pending -awaiting new PT/OT notes.   TOC to continue to follow.  Expected Discharge Plan: IP Rehab Facility Barriers to Discharge: Continued Medical Work up   Patient Goals and CMS Choice Patient states their goals for this hospitalization and ongoing recovery are:: rehab   Choice offered to / list presented to : Adult Children  Expected Discharge Plan and Services Expected Discharge Plan: IP Rehab Facility   Discharge Planning Services: CM Consult   Living arrangements for the past 2 months: Single Family Home                                      Prior Living Arrangements/Services Living arrangements for the past 2 months: Single Family Home Lives with:: Adult Children Patient language and need for interpreter reviewed:: Yes        Need for Family Participation in Patient Care: Yes (Comment) Care giver support system in place?: Yes (comment)   Criminal Activity/Legal Involvement Pertinent to Current Situation/Hospitalization: No - Comment as needed  Activities of Daily Living      Permission Sought/Granted                  Emotional Assessment Appearance:: Appears stated age     Orientation: : Oriented to Self Alcohol / Substance Use: Not Applicable Psych Involvement: No (comment)  Admission diagnosis:  Acute ischemic left MCA stroke (HCC) [I63.512] Patient Active Problem List   Diagnosis Date Noted   Acute ischemic left MCA stroke (HCC)  06/19/2021   Cerebrovascular accident (CVA) (HCC)    Esophageal ulcer 05/01/2020   Hypokalemia    AF (paroxysmal atrial fibrillation) (HCC)    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 [place], [place] - [address] [place] [address] Phone: [phone] Fax: [phone]  [facility] - [place], [place] - [address] [place] [address] Phone: [phone] Fax: [phone]     Social Determinants of Health (SDOH) Interventions    Readmission Risk Interventions No flowsheet data found.

## 2021-06-26 NOTE — Progress Notes (Signed)
[facility]  PROGRESS NOTE  Devon Elliott. NF:[id number] DOB: [birth year] DOA: 06/19/2021 PCP: Rusty Aus, MD   Brief HPI:   87 year old male with history of hypertension, hyperlipidemia, atrial fibrillation not on anticoagulation presented to [facility] on 12/5 with aphasia and right-sided weakness.  CTA showed distal left M2 occlusion.  Patient was brought to [facility] for neuro intervention radiology mechanical thrombectomy.  Patient did not receive tPA as he was deemed to be outside of the window. Patient underwent mechanical thrombectomy per IR, after the procedure he became agitated and restless.  He received diphenhydramine and Haldol with Ativan.  Patient became somnolent with mild desaturation into 80s on 3 L of oxygen.  Mental status was depressed, he was minimally responsive to noxious stimuli.  PCCM was subsequently consulted for airway protection. Patient received Romazicon for reversal He was transferred out of ICU TRH  assumed care on 06/23/2021   Subjective   Patient seen, more alert.  Communicating.  Complains of mild epigastric discomfort.   Assessment/Plan:    Left middle cerebral artery infarct -MRI brain showed left MCA infarct in posterior left frontoparietal region -Secondary to embolism from atrial fibrillation -S/p mechanical thrombectomy of left MCA M2 occlusion -Excellent revascularization achieved -Patient started on heparin GTT due to atrial fibrillation -Heparin was changed to Lovenox which was changed to  p.o. Eliquis -Plan to go to CIR  Epigastric discomfort ?  GERD -Start Protonix 40 mg p.o. daily  Delirium -Developed after thrombectomy -Fluctuating course; delirium has improved today -Was in the ICU, required Precedex, he has been weaned off Precedex -Patient did receive Romazicon for reversal of benzodiazepines -Started on Depakote 125 mg p.o. every 8 hours; continue Seroquel 25 mg p.o. nightly -Continue delirium precautions  Acute  kidney injury -Creatinine was 1.52 today; started on IV normal saline at 100 mL/h -IV fluids stopped -Acute kidney injury has resolved, creatinine 0.98  Hypokalemia -Potassium is still 3.3 despite replacement with potassium -Serum magnesium obtained yesterday was 1.6 -We will replace magnesium with 2 g IV magnesium sulfate -Follow BMP today  Acute hypoxemic respiratory failure - multifactorial; pulmonary edema, atelectasis -Resolved -Continue I-S, flutter valve  Atrial fibrillation -Heart rate controlled, continue metoprolol 25 mg p.o. twice daily -Started on heparin GTT -Heparin was switched to p.o. Eliquis  HFrEF -Currently euvolemic -Received IV fluids briefly as above -He is off IV fluids now  Hyperlipidemia -Continue atorvastatin 40 mg daily  Hypertension -Blood pressure is elevated -Continue p.o. metoprolol  Goals of care -Discussed with patient and son at bedside, he understands that patient may not do well.  He did not want any aggressive interventions in case patient confusion does not improve and has poor p.o. intake.  We will continue to monitor over the next few days.  Palliative care following.  Medications     apixaban  5 mg Oral BID   atorvastatin  40 mg Oral Daily   Chlorhexidine Gluconate Cloth  6 each Topical Daily   divalproex  125 mg Oral Q8H   memantine  5 mg Oral q morning   metoprolol tartrate  25 mg Oral BID   mupirocin ointment  1 application Nasal BID   pantoprazole  40 mg Oral Q1200   QUEtiapine  25 mg Oral QHS   QUEtiapine  25 mg Oral Q0600     Data Reviewed:   CBG:  No results for input(s): GLUCAP in the last 168 hours.  SpO2: 98 % O2 Flow Rate (L/min): 5 L/min FiO2 (%):  100 %    Vitals:   06/25/21 1924 06/25/21 2319 06/26/21 0319 06/26/21 0719  BP: 137/78 (!) 115/53 107/73 117/62  Pulse: (!) 107 99 93 97  Resp: 18 20 (!) 22 16  Temp: 98.9 F (37.2 C) 97.8 F (36.6 C) 98 F (36.7 C) 98.6 F (37 C)  TempSrc: Oral Oral  Oral Oral  SpO2: 98% 98% 97% 98%  Height:         Intake/Output Summary (Last 24 hours) at 06/26/2021 [id number] Last data filed at 06/26/2021 0600 Gross per 24 hour  Intake --  Output 500 ml  Net -500 ml    12/10 1901 - 12/12 0700 In: -  Out: 1380 [Urine:1380]  There were no vitals filed for this visit.  Data Reviewed: Basic Metabolic Panel: Recent Labs  Lab 06/21/21 0323 06/22/21 0326 06/23/21 0941 06/24/21 0317 06/25/21 0254 06/25/21 2330  NA 142 139 143 143 140  --   K 4.0 3.5 3.6 3.3* 3.3*  --   CL 112* 109 114* 114* 109  --   CO2 21* 19* 22 20* 22  --   GLUCOSE 106* 153* 99 87 97  --   BUN 20 25* 33* 22 16  --   CREATININE 1.20 1.52* 1.52* 1.04 0.98  --   CALCIUM 7.7* 7.9* 7.5* 7.4* 8.2*  --   MG  --   --   --   --   --  1.6*   Liver Function Tests: Recent Labs  Lab 06/19/21 1207  AST 21  ALT 10  ALKPHOS 56  BILITOT 0.6  PROT 6.3*  ALBUMIN 3.8   No results for input(s): LIPASE, AMYLASE in the last 168 hours. No results for input(s): AMMONIA in the last 168 hours. CBC: Recent Labs  Lab 06/19/21 1207 06/19/21 1710 06/21/21 0323 06/22/21 0326 06/23/21 0941 06/24/21 0317  WBC 6.7 9.3 11.1* 12.2* 7.4 7.5  NEUTROABS 5.1  --   --   --   --   --   HGB 12.7* 12.2* 11.2* 11.8* 10.2* 10.3*  HCT 39.1 37.0* 35.3* 36.7* 31.4* 32.4*  MCV 94.7 93.0 94.4 92.7 92.1 93.4  PLT 172 167 161 159 126* 121*   Cardiac Enzymes: No results for input(s): CKTOTAL, CKMB, CKMBINDEX, TROPONINI in the last 168 hours. BNP (last 3 results) No results for input(s): BNP in the last 8760 hours.  ProBNP (last 3 results) No results for input(s): PROBNP in the last 8760 hours.  CBG: No results for input(s): GLUCAP in the last 168 hours.     Radiology Reports  MR BRAIN WO CONTRAST  Result Date: 06/24/2021 CLINICAL DATA:  Follow-up examination for acute stroke. EXAM: MRI HEAD WITHOUT CONTRAST TECHNIQUE: Multiplanar, multiecho pulse sequences of the brain and surrounding  structures were obtained without intravenous contrast. COMPARISON:  Multiple previous CTs dating back to 06/19/2021. FINDINGS: Brain: Diffuse prominence of the CSF containing spaces compatible generalized cerebral atrophy. Confluent T2/FLAIR hyperintensity involving the periventricular and deep white matter both cerebral hemispheres consistent with chronic microvascular ischemic disease, moderately advanced in nature. Scattered restricted diffusion involving the posterior left frontoparietal region consistent with acute left MCA distribution infarct (series 2, image 31). Involvement is predominantly cortical in nature, although there is some subcortical involvement at the frontal lobe anteriorly. No associated hemorrhage or mass effect. No other evidence for acute or subacute ischemia. Gray-white matter differentiation otherwise maintained. No other areas of chronic infarction. No acute or chronic intracranial hemorrhage. No mass lesion, mass effect, or midline  shift. Ventricular prominence related to global parenchymal volume loss without hydrocephalus. No extra-axial fluid collection. Vascular: Hypoplastic right vertebral artery not visualized, consistent with previously identified occlusion. Major intracranial vascular flow voids otherwise maintained. Skull and upper cervical spine: Craniocervical junction within normal limits. Bone marrow signal intensity normal. No scalp soft tissue abnormality. Sinuses/Orbits: Prior bilateral ocular lens replacement. Paranasal sinuses are largely clear. No significant mastoid effusion. Other: None. IMPRESSION: 1. Acute ischemic nonhemorrhagic left MCA distribution infarct involving the posterior left frontoparietal region. No associated hemorrhage or mass effect. 2. Underlying age-related cerebral atrophy with moderate chronic microvascular ischemic disease. Electronically Signed   By: Jeannine Boga M.D.   On: 06/24/2021 21:52       Antibiotics: Anti-infectives  (From admission, onward)    None         DVT prophylaxis: Lovenox  Code Status: Full code  Family Communication: Discussed with patient's son at bedside   Consultants: Neurology PCCM  Procedures:     Objective    Physical Examination:  General-appears in no acute distress Heart-S1-S2, regular, no murmur auscultated Lungs-clear to auscultation bilaterally, no wheezing or crackles auscultated Abdomen-soft, nontender, no organomegaly Extremities-no edema in the lower extremities Neuro-alert, oriented to self only , no focal deficit noted, following commands  Status is: Inpatient  Dispo: The patient is from: Home              Anticipated d/c is to: Skilled nursing facility              Anticipated d/c date is: 06/27/2021              Patient currently not stable for discharge  Barrier to discharge-delirium  COVID-19 Labs  No results for input(s): DDIMER, FERRITIN, LDH, CRP in the last 72 hours.  Lab Results  Component Value Date   SARSCOV2NAA NEGATIVE 06/19/2021   [place] NEGATIVE 04/30/2020   [place] NEGATIVE 07/29/2019            Recent Results (from the past 240 hour(s))  Resp Panel by RT-PCR (Flu A&B, Covid) Nasopharyngeal Swab     Status: None   Collection Time: 06/19/21 12:07 PM   Specimen: Nasopharyngeal Swab; Nasopharyngeal(NP) swabs in vial transport medium  Result Value Ref Range Status   SARS Coronavirus 2 by RT PCR NEGATIVE NEGATIVE Final    Comment: (NOTE) SARS-CoV-2 target nucleic acids are NOT DETECTED.  The SARS-CoV-2 RNA is generally detectable in upper respiratory specimens during the acute phase of infection. The lowest concentration of SARS-CoV-2 viral copies this assay can detect is 138 copies/mL. A negative result does not preclude SARS-Cov-2 infection and should not be used as the sole basis for treatment or other patient management decisions. A negative result may occur with  improper specimen  collection/handling, submission of specimen other than nasopharyngeal swab, presence of viral mutation(s) within the areas targeted by this assay, and inadequate number of viral copies(<138 copies/mL). A negative result must be combined with clinical observations, patient history, and epidemiological information. The expected result is Negative.  Fact Sheet for Patients:  EntrepreneurPulse.com.au  Fact Sheet for Healthcare Providers:  IncredibleEmployment.be  This test is no t yet approved or cleared by the [place] FDA and  has been authorized for detection and/or diagnosis of SARS-CoV-2 by FDA under an Emergency Use Authorization (EUA). This EUA will remain  in effect (meaning this test can be used) for the duration of the COVID-19 declaration under Section 564(b)(1) of the Act, 21 U.S.C.section 360bbb-3(b)(1), unless the authorization  is terminated  or revoked sooner.       Influenza A by PCR NEGATIVE NEGATIVE Final   Influenza B by PCR NEGATIVE NEGATIVE Final    Comment: (NOTE) The Xpert Xpress SARS-CoV-2/FLU/RSV plus assay is intended as an aid in the diagnosis of influenza from Nasopharyngeal swab specimens and should not be used as a sole basis for treatment. Nasal washings and aspirates are unacceptable for Xpert Xpress SARS-CoV-2/FLU/RSV testing.  Fact Sheet for Patients: BloggerCourse.com  Fact Sheet for Healthcare Providers: SeriousBroker.it  This test is not yet approved or cleared by the [place] FDA and has been authorized for detection and/or diagnosis of SARS-CoV-2 by FDA under an Emergency Use Authorization (EUA). This EUA will remain in effect (meaning this test can be used) for the duration of the COVID-19 declaration under Section 564(b)(1) of the Act, 21 U.S.C. section 360bbb-3(b)(1), unless the authorization is terminated or revoked.  Performed at [facility], [address] [place], [place] [address]   Surgical PCR screen     Status: None   Collection Time: 06/22/21  6:56 PM   Specimen: Nasal Mucosa; Nasal Swab  Result Value Ref Range Status   MRSA, PCR NEGATIVE NEGATIVE Final   Staphylococcus aureus NEGATIVE NEGATIVE Final    Comment: (NOTE) The Xpert SA Assay (FDA approved for NASAL specimens in patients 56 years of age and older), is one component of a comprehensive surveillance program. It is not intended to diagnose infection nor to guide or monitor treatment. Performed at [facility], [address], [place], [place] [address]     Meredeth Ide   [facility] If 7PM-7AM, please contact night-coverage at [email or web address], Office  [phone]   06/26/2021, 9:39 AM  LOS: 7 days

## 2021-06-26 NOTE — Progress Notes (Signed)
Physical Therapy Treatment Patient Details Name: Devon Elliott. MRN: [id number] DOB: 02/11/1927 Today's Date: 06/26/2021   History of Present Illness Pt is 87 yo male admitted 12/5 with Lt MCA CVA s/p thrombectomy. Pt with agitation/restlessness post op, he has been getting Seroquel to manage. PMhx: dementia, HLD, HTN, dysphagia, AKI, Afib    PT Comments    Pt more lethargic today.  He required mod A for transfers with max multimodal cues to initiate.  Requiring assist to weight shift to initiate steps.  He did ambulate further and with use of RW, but was further limited by lethargy - suspect medication related.  .   Recommendations for follow up therapy are one component of a multi-disciplinary discharge planning process, led by the attending physician.  Recommendations may be updated based on patient status, additional functional criteria and insurance authorization.  Follow Up Recommendations  Acute inpatient rehab (3hours/day)     Assistance Recommended at Discharge Frequent or constant Supervision/Assistance  Equipment Recommendations  BSC/3in1    Recommendations for Other Services       Precautions / Restrictions Precautions Precautions: Fall;Other (comment) Precaution Comments: SBP<200     Mobility  Bed Mobility Overal bed mobility: Needs Assistance       Supine to sit: Mod assist     General bed mobility comments: Pt more lethargic requiring increased time and multimodal cues with mod A to lift trunk    Transfers Overall transfer level: Needs assistance Equipment used: Rolling walker (2 wheels) Transfers: Sit to/from Stand Sit to Stand: Mod assist           General transfer comment: Mod A to initiate but min A to complete stand; mod A with max multimodal cues to scoot back in chair    Ambulation/Gait Ambulation/Gait assistance: Mod assist Gait Distance (Feet): 32 Feet Assistive device: Rolling walker (2 wheels) Gait Pattern/deviations:  Step-to pattern;Decreased stride length;Wide base of support;Shuffle Gait velocity: decreased     General Gait Details: Pt with very short steps with shuffle gait and would freeze at times.  Required assist weight shifting to initiate steps 90% of time.  Tried verbal/visual/tactile cues to improve step length but little improvement   Marine scientist Rankin (Stroke Patients Only) Modified Rankin (Stroke Patients Only) Pre-Morbid Rankin Score: No significant disability Modified Rankin: Moderately severe disability     Balance Overall balance assessment: Needs assistance Sitting-balance support: Feet supported;No upper extremity supported Sitting balance-Leahy Scale: Fair     Standing balance support: Bilateral upper extremity supported Standing balance-Leahy Scale: Poor Standing balance comment: bil UE support with min assist                            Cognition Arousal/Alertness: Lethargic Behavior During Therapy: WFL for tasks assessed/performed Overall Cognitive Status: Impaired/Different from baseline                   Orientation Level: Disoriented to;Place;Time;Situation Current Attention Level: Focused Memory: Decreased short-term memory Following Commands: Follows one step commands inconsistently Safety/Judgement: Decreased awareness of safety;Decreased awareness of deficits   Problem Solving: Requires verbal cues;Requires tactile cues;Slow processing;Decreased initiation;Difficulty sequencing General Comments: Pt lethargic today requiring increased multimodal cues and assist to initiate.        Exercises General Exercises - Lower Extremity Ankle Circles/Pumps: AAROM;Both;15 reps;Seated Long Arc Quad: AAROM;Both;15 reps;Seated Hip Flexion/Marching: AAROM;Both;15 reps;Seated  Other Exercises Other Exercises: Requiring AAROM due to lethargy    General Comments General comments (skin integrity, edema,  etc.): Pt on 2 L O2 at rest with sats 100%.  Performed therapy on RA with sats >96% when pleth good.  Pleth with poor signal with hand use and incorrect readings.  Did place pt back on 2 L O2 at rest due to lethargy.      Pertinent Vitals/Pain Pain Assessment: No/denies pain    Home Living                          Prior Function            PT Goals (current goals can now be found in the care plan section) Progress towards PT goals: Progressing toward goals    Frequency    Min 4X/week      PT Plan Current plan remains appropriate    Co-evaluation              AM-PAC PT "6 Clicks" Mobility   Outcome Measure  Help needed turning from your back to your side while in a flat bed without using bedrails?: A Lot Help needed moving from lying on your back to sitting on the side of a flat bed without using bedrails?: A Lot Help needed moving to and from a bed to a chair (including a wheelchair)?: A Lot Help needed standing up from a chair using your arms (e.g., wheelchair or bedside chair)?: A Lot Help needed to walk in hospital room?: A Lot Help needed climbing 3-5 steps with a railing? : Total 6 Click Score: 11    End of Session Equipment Utilized During Treatment: Gait belt Activity Tolerance: Patient tolerated treatment well Patient left: with call bell/phone within reach;with chair alarm set;in chair (waist posey) Nurse Communication: Mobility status PT Visit Diagnosis: Unsteadiness on feet (R26.81);Other abnormalities of gait and mobility (R26.89);Difficulty in walking, not elsewhere classified (R26.2);Other symptoms and signs involving the nervous system (R29.898)     Time: 1610-9604 PT Time Calculation (min) (ACUTE ONLY): 30 min  Charges:  $Gait Training: 8-22 mins $Therapeutic Exercise: 8-22 mins                     Anise Salvo, PT Acute Rehab Services Pager [phone] [facility] [phone]    Rayetta Humphrey 06/26/2021, 10:49 AM

## 2021-06-27 DIAGNOSIS — Z7189 Other specified counseling: Secondary | ICD-10-CM

## 2021-06-27 DIAGNOSIS — R410 Disorientation, unspecified: Secondary | ICD-10-CM | POA: Diagnosis present

## 2021-06-27 DIAGNOSIS — R1013 Epigastric pain: Secondary | ICD-10-CM | POA: Diagnosis present

## 2021-06-27 DIAGNOSIS — E785 Hyperlipidemia, unspecified: Secondary | ICD-10-CM | POA: Diagnosis present

## 2021-06-27 DIAGNOSIS — J9601 Acute respiratory failure with hypoxia: Secondary | ICD-10-CM | POA: Diagnosis present

## 2021-06-27 DIAGNOSIS — I5020 Unspecified systolic (congestive) heart failure: Secondary | ICD-10-CM | POA: Diagnosis present

## 2021-06-27 LAB — BASIC METABOLIC PANEL
Anion gap: 8 (ref 5–15)
BUN: 26 mg/dL — ABNORMAL HIGH (ref 8–23)
CO2: 21 mmol/L — ABNORMAL LOW (ref 22–32)
Calcium: 8 mg/dL — ABNORMAL LOW (ref 8.9–10.3)
Chloride: 112 mmol/L — ABNORMAL HIGH (ref 98–111)
Creatinine, Ser: 1.11 mg/dL (ref 0.61–1.24)
GFR, Estimated: >60 mL/min (ref >60)
Glucose, Bld: 113 mg/dL — ABNORMAL HIGH (ref 70–99)
Potassium: 3.5 mmol/L (ref 3.5–5.1)
Sodium: 141 mmol/L (ref 135–145)

## 2021-06-27 NOTE — Plan of Care (Signed)
  Problem: Education: Goal: Knowledge of disease or condition will improve Outcome: Progressing Goal: Knowledge of secondary prevention will improve (SELECT ALL) Outcome: Progressing Goal: Knowledge of patient specific risk factors will improve (INDIVIDUALIZE FOR PATIENT) Outcome: Progressing   Problem: Health Behavior/Discharge Planning: Goal: Ability to manage health-related needs will improve Outcome: Progressing   Problem: Nutrition: Goal: Risk of aspiration will decrease Outcome: Progressing Goal: Dietary intake will improve Outcome: Progressing   Problem: Ischemic Stroke/TIA Tissue Perfusion: Goal: Complications of ischemic stroke/TIA will be minimized Outcome: Progressing

## 2021-06-27 NOTE — Progress Notes (Signed)
Physical Therapy Treatment Patient Details Name: Devon Elliott. MRN: [id number] DOB: 1927-02-25 Today's Date: 06/27/2021   History of Present Illness 87 yo male admitted 12/5 with Lt MCA CVA s/p thrombectomy. Pt with agitation/restlessness post op, he has been getting Seroquel to manage. PMhx: dementia, HLD, HTN, dysphagia, AKI, Afib    PT Comments    Pt alert but confused. Had periods where he began to become agitated but did not escalate. Was somewhat resistant throughout mobility and seemed fearful of falling, EOB and in standing. Pt has difficulty advancing RLE, worked on different cue types to improve this with minimal success. Pt ambulated 20' with HHA +2, started with RW but he kept letting go of it. Ended with pt in chair with posey alarm belt and lunch set up in front of him. PT will continue to follow.    Recommendations for follow up therapy are one component of a multi-disciplinary discharge planning process, led by the attending physician.  Recommendations may be updated based on patient status, additional functional criteria and insurance authorization.  Follow Up Recommendations  Acute inpatient rehab (3hours/day)     Assistance Recommended at Discharge Frequent or constant Supervision/Assistance  Equipment Recommendations  BSC/3in1    Recommendations for Other Services       Precautions / Restrictions Precautions Precautions: Fall;Other (comment) Precaution Comments: SBP<200 Restrictions Weight Bearing Restrictions: No     Mobility  Bed Mobility Overal bed mobility: Needs Assistance Bed Mobility: Supine to Sit     Supine to sit: Max assist;+2 for physical assistance     General bed mobility comments: not initiating mvmt to EOB and seemed fearful of falling, needed max A +2 to bring trunk fwd into sitting. Maintained strong posterior lean first few minutes then allowed flexion at trunk and sat with min guard A    Transfers Overall transfer level:  Needs assistance Equipment used: Rolling walker (2 wheels) Transfers: Sit to/from Stand Sit to Stand: Mod assist;+2 safety/equipment;From elevated surface           General transfer comment: mod A to initiate and steady    Ambulation/Gait Ambulation/Gait assistance: Mod assist;+2 safety/equipment Gait Distance (Feet): 20 Feet Assistive device: Rolling walker (2 wheels);2 person hand held assist Gait Pattern/deviations: Step-to pattern;Decreased stride length;Wide base of support;Shuffle Gait velocity: decreased Gait velocity interpretation: <1.31 ft/sec, indicative of household ambulator   General Gait Details: began with RW but it was bothering pt being in front of him and he kept letting go and was becoming agitated so RW taken away and HHA x2 given. Pt had much difficulty stepping RLE. Visual nor verbal cues helped improve this. Chair brought behind pt   Stairs             Wheelchair Mobility    Modified Rankin (Stroke Patients Only) Modified Rankin (Stroke Patients Only) Pre-Morbid Rankin Score: No significant disability Modified Rankin: Moderately severe disability     Balance Overall balance assessment: Needs assistance Sitting-balance support: Feet supported;No upper extremity supported Sitting balance-Leahy Scale: Fair   Postural control: Posterior lean Standing balance support: Bilateral upper extremity supported;No upper extremity supported;During functional activity Standing balance-Leahy Scale: Poor Standing balance comment: reliant on physical A and/or UE support                            Cognition Arousal/Alertness: Awake/alert Behavior During Therapy: WFL for tasks assessed/performed Overall Cognitive Status: Impaired/Different from baseline Area of Impairment: Orientation;Attention;Memory;Following commands;Safety/judgement;Awareness;Problem solving  Orientation Level: Disoriented  to;Time;Place;Situation Current Attention Level: Focused;Sustained Memory: Decreased short-term memory Following Commands: Follows one step commands inconsistently;Follows one step commands with increased time Safety/Judgement: Decreased awareness of safety;Decreased awareness of deficits Awareness: Intellectual Problem Solving: Requires verbal cues;Requires tactile cues;Slow processing;Decreased initiation;Difficulty sequencing General Comments: alert but confused. With hearing loss seems to do best when keeping volume low but speaking close to his ear. This seemed to help keep him calm        Exercises      General Comments General comments (skin integrity, edema, etc.): pt in recliner with posey alarm belt and encouraged to eat hus lunch      Pertinent Vitals/Pain Pain Assessment: No/denies pain    Home Living                          Prior Function            PT Goals (current goals can now be found in the care plan section) Acute Rehab PT Goals Patient Stated Goal: return home PT Goal Formulation: With patient/family Time For Goal Achievement: 07/06/21 Potential to Achieve Goals: Fair Progress towards PT goals: Progressing toward goals    Frequency    Min 4X/week      PT Plan Current plan remains appropriate    Co-evaluation              AM-PAC PT "6 Clicks" Mobility   Outcome Measure  Help needed turning from your back to your side while in a flat bed without using bedrails?: A Lot Help needed moving from lying on your back to sitting on the side of a flat bed without using bedrails?: A Lot Help needed moving to and from a bed to a chair (including a wheelchair)?: A Lot Help needed standing up from a chair using your arms (e.g., wheelchair or bedside chair)?: A Lot Help needed to walk in hospital room?: Total Help needed climbing 3-5 steps with a railing? : Total 6 Click Score: 10    End of Session Equipment Utilized During Treatment:  Gait belt Activity Tolerance: Patient tolerated treatment well Patient left: with call bell/phone within reach;with chair alarm set;in chair (waist posey) Nurse Communication: Mobility status PT Visit Diagnosis: Unsteadiness on feet (R26.81);Other abnormalities of gait and mobility (R26.89);Difficulty in walking, not elsewhere classified (R26.2);Other symptoms and signs involving the nervous system (R29.898)     Time: 1610-9604 PT Time Calculation (min) (ACUTE ONLY): 23 min  Charges:  $Gait Training: 8-22 mins $Neuromuscular Re-education: 8-22 mins                     Lyanne Co, PT  Acute Rehab Services  Pager [phone] Office [phone]    Lawana Chambers Latonya Nelon 06/27/2021, 1:58 PM

## 2021-06-27 NOTE — Progress Notes (Addendum)
Progress note:  After reviewing the patient's chart, I assessed the patient at bedside.  He was sitting up in the chair with his food tray in front of him.  He was using his flutter valve.  Patient appears to be in no apparent distress.  Goals of care are clear as reflected in his DNR and MOST form.   Plan is set for patient to be evaluated for CIR and rehab at discharge.  Palliative medicine will continue to monitor the patient throughout his hospitalization.  PMT will shadow his chart and intervene should the patient decline or family/primary team reach out to PMT.  Samara Deist L. Manon Hilding, FNP-BC Palliative Medicine Team Team Phone # [phone]  NO CHARGE

## 2021-06-27 NOTE — Progress Notes (Signed)
PROGRESS NOTE  Devon Elliott. [id number] DOB: 1927-06-15 DOA: 06/19/2021 PCP: Rusty Aus, MD  Brief History   87 year old male with history of hypertension, hyperlipidemia, atrial fibrillation not on anticoagulation presented to [facility] on 12/5 with aphasia and right-sided weakness.  CTA showed distal left M2 occlusion.  Patient was brought to [facility] for neuro intervention radiology mechanical thrombectomy.  Patient did not receive tPA as he was deemed to be outside of the window. Patient underwent mechanical thrombectomy per IR, after the procedure he became agitated and restless.  He received diphenhydramine and Haldol with Ativan.  Patient became somnolent with mild desaturation into 80s on 3 L of oxygen.  Mental status was depressed, he was minimally responsive to noxious stimuli.  PCCM was subsequently consulted for airway protection. Patient received Romazicon for reversal He was transferred out of ICU [facility]  assumed care on 06/23/2021  Consultants  PCCM Palliative Care  Procedures  None  Antibiotics   Anti-infectives (From admission, onward)    None      Subjective  This patient is resting comfortably. He is confused.  Objective   Vitals:  Vitals:   06/27/21 1112 06/27/21 1520  BP: 130/86 (!) 172/78  Pulse: 96 87  Resp: (!) 24 20  Temp: 98 F (36.7 C) 97.9 F (36.6 C)  SpO2: 96% 97%   Exam:  Constitutional:  The patient is awake, alert, and oriented x 1. No acute distress. Respiratory:  No increased work of breathing. No wheezes, rales, or rhonchi No tactile fremitus Cardiovascular:  Regular rate and rhythm No murmurs, ectopy, or gallups. No lateral PMI. No thrills. Abdomen:  Abdomen is soft, non-tender, non-distended No hernias, masses, or organomegaly Normoactive bowel sounds.  Musculoskeletal:  No cyanosis, clubbing, or edema Skin:  No rashes, lesions, ulcers palpation of skin: no induration or nodules Neurologic:  CN 2-12  intact No focal deficits Speech is nonsensical Psychiatric:  Mental status - confused   I have personally reviewed the following:   [place]    Cardiology Data  EKG Echocardiogram  Scheduled Meds:  apixaban  5 mg Oral BID   atorvastatin  40 mg Oral Daily   Chlorhexidine Gluconate Cloth  6 each Topical Daily   divalproex  125 mg Oral Q8H   memantine  5 mg Oral q morning   metoprolol tartrate  25 mg Oral BID   mupirocin ointment  1 application Nasal BID   pantoprazole  40 mg Oral Q1200   QUEtiapine  25 mg Oral QHS   QUEtiapine  25 mg Oral Q0600    Problem  Abdominal Pain, Acute, Epigastric  Delirium  Acute Hypoxemic Respiratory Failure (Hcc)  Hfref (Heart Failure With Reduced Ejection Fraction) (Hcc)  Hyperlipidemia  Goals of Care, Counseling/Discussion  Acute Ischemic Left Mca Stroke (Hcc)  Hypokalemia  Af (Paroxysmal Atrial Fibrillation) (Hcc)  Aki (Acute Kidney Injury) (Hcc)  Essential Hypertension      LOS: 8 days   A & P  Left middle cerebral artery infarct -MRI brain showed left MCA infarct in posterior left frontoparietal region -Secondary to embolism from atrial fibrillation -S/p mechanical thrombectomy of left MCA M2 occlusion -Excellent revascularization achieved -Patient started on heparin GTT due to atrial fibrillation -Heparin was changed to Lovenox which was changed to  p.o. Eliquis -Plan to go to CIR   Epigastric discomfort ?  GERD -Start Protonix 40 mg p.o. daily - No further complaints.   Delirium -  Developed after thrombectomy -Fluctuating course; Pt is quite confused today. -Was in the ICU, required Precedex, he has been weaned off Precedex -Patient did receive Romazicon for reversal of benzodiazepines -Started on Depakote 125 mg p.o. every 8 hours; continue Seroquel 25 mg p.o. nightly -Continue delirium precautions   Acute kidney injury -Creatinine was 1.52 today; started on IV normal  saline at 100 mL/h -IV fluids stopped -Acute kidney injury has resolved, creatinine 1.11.   Hypokalemia -Resolved. Potassium is 3.5 after replacement with potassium -Serum magnesium obtained yesterday was 1.6 - supplemented - Continue to monitor magnesium and potassium and supplement as necessary.   Acute hypoxemic respiratory failure - multifactorial; pulmonary edema, atelectasis -Resolved - Patient is currently saturating 97% on room air.  -Continue I-S, flutter valve   Atrial fibrillation -Heart rate controlled, continue metoprolol 25 mg p.o. twice daily -Started on heparin GTT -Heparin was switched to p.o. Eliquis   HFrEF -Currently euvolemic -EF 45% with mildly decreased function and global hypokinesis as per echocardiogram of 06/19/2021. -Received IV fluids briefly as above -He is off IV fluids now   Hyperlipidemia -Continue atorvastatin 40 mg daily   Hypertension -Blood pressure is elevated -Continue p.o. metoprolol and add amlodipine.   Goals of care -Discussed with patient and son at bedside, he understands that patient may not do well.  He did not want any aggressive interventions in case patient confusion does not improve and has poor p.o. intake.  We will continue to monitor over the next few days.  Palliative care following.   I have seen and examined this patient myself. I have spent 35 minutes in his evaluation and care.  DVT prophylaxis: Eilquis Code Status: DNR Family Communication: None available Disposition Plan: CIR    Teshara Moree, DO [facility] Direct contact: see [email or web address]  7PM-7AM contact night coverage as above 06/27/2021, 4:14 PM  LOS: 8 days

## 2021-06-27 NOTE — Progress Notes (Signed)
Inpatient Rehab Admissions Coordinator:    I do not have a bed for this Pt. Today on CIR but will follow and pursue for potential admit pending insurance auth and bed availability.  Megan Salon, MS, CCC-SLP Rehab Admissions Coordinator  [phone] (celll) [phone] (office)

## 2021-06-28 DIAGNOSIS — N179 Acute kidney failure, unspecified: Secondary | ICD-10-CM

## 2021-06-28 DIAGNOSIS — I480 Paroxysmal atrial fibrillation: Secondary | ICD-10-CM

## 2021-06-28 DIAGNOSIS — I5020 Unspecified systolic (congestive) heart failure: Secondary | ICD-10-CM

## 2021-06-28 LAB — CBC
HCT: 34.2 % — ABNORMAL LOW (ref 39.0–52.0)
Hemoglobin: 11.2 g/dL — ABNORMAL LOW (ref 13.0–17.0)
MCH: 30.1 pg (ref 26.0–34.0)
MCHC: 32.7 g/dL (ref 30.0–36.0)
MCV: 91.9 fL (ref 80.0–100.0)
Platelets: 170 10*3/uL (ref 150–400)
RBC: 3.72 MIL/uL — ABNORMAL LOW (ref 4.22–5.81)
RDW: 14.7 % (ref 11.5–15.5)
WBC: 10.1 10*3/uL (ref 4.0–10.5)
nRBC: 0 % (ref 0.0–0.2)

## 2021-06-28 LAB — BASIC METABOLIC PANEL
Anion gap: 6 (ref 5–15)
BUN: 25 mg/dL — ABNORMAL HIGH (ref 8–23)
CO2: 24 mmol/L (ref 22–32)
Calcium: 7.8 mg/dL — ABNORMAL LOW (ref 8.9–10.3)
Chloride: 111 mmol/L (ref 98–111)
Creatinine, Ser: 1.19 mg/dL (ref 0.61–1.24)
GFR, Estimated: 57 mL/min — ABNORMAL LOW (ref >60)
Glucose, Bld: 125 mg/dL — ABNORMAL HIGH (ref 70–99)
Potassium: 3.5 mmol/L (ref 3.5–5.1)
Sodium: 141 mmol/L (ref 135–145)

## 2021-06-28 NOTE — Progress Notes (Addendum)
Speech Language Pathology Treatment: Cognitive-Linquistic  Patient Details Name: Devon Elliott. MRN: [id number] DOB: 03-15-1927 Today's Date: 06/28/2021 Time: 4010-2725 SLP Time Calculation (min) (ACUTE ONLY): 16 min  Assessment / Plan / Recommendation Clinical Impression  Pt more alert than when last seen by SLP.  Pt continues with persisting confusion and both language and cognitive deficits.  He has difficulty initiating and sustaining attention, requiring mod multimodal cues. He required max assist with repetition and modeling to follow simple commands. Unable to name basic objects -this appeared more related to poor comprehension of task.  Spontaneous speech more prevalent but difficult to understand with errors of naming and irrelevance to topic.  Max overall cues needed for engagement, consistent responsiveness, initiation.  SLP will follow.   HPI HPI: 87 yo male admitted 06/19/21 at [facility] with aphasia and right side weakness. PMH: mild dementia, HTN, HLD, AF not on AC, complete tear of left rotator cuff, Essential HTN, Impingement syndrome of left shoulder; Left hip pain; Left supraspinatus tenosynovitis; Pure hypercholesterolemia; Oral phase dysphagia; Esophageal dysphagia; Esophageal mass; AKI, Hypernatremia; Cellulitis of left leg; Gastroesophageal reflux disease without esophagitis; Esophageal ulcer; Hypokalemia; PAFib, CVA; and Acute ischemic left MCA stroke (HCC).  Pt had MBSS in Dec 2017 showing functional oropharyngal swallowing.      SLP Plan  Continue with current plan of care      Recommendations for follow up therapy are one component of a multi-disciplinary discharge planning process, led by the attending physician.  Recommendations may be updated based on patient status, additional functional criteria and insurance authorization.    Recommendations   CIR                Oral Care Recommendations: Oral care BID Follow Up Recommendations: Acute inpatient rehab  (3hours/day) Assistance recommended at discharge: Frequent or constant Supervision/Assistance SLP Visit Diagnosis: Cognitive communication deficit (D66.440) Plan: Continue with current plan of care         Meliss Fleek L. [name], MA CCC/SLP Acute Rehabilitation Services Office number [phone] Pager [phone]   [name]  06/28/2021, 12:06 PM

## 2021-06-28 NOTE — Progress Notes (Signed)
Physical Therapy Treatment Patient Details Name: Devon Elliott. MRN: [id number] DOB: 1927/05/24 Today's Date: 06/28/2021   History of Present Illness 87 yo male admitted 12/5 with Lt MCA CVA s/p thrombectomy. Pt with agitation/restlessness post op, he has been getting Seroquel to manage. PMhx: dementia, HLD, HTN, dysphagia, AKI, Afib    PT Comments    Patient requires max encouragement to perform tasks due to potential fear of falling. Patient requires modA+2 for sit to stand and short ambulation distance in room with HHAx2. Patient with confusion but has hx of dementia. Patient able to maintain sitting balance EOB with close supervision but kyphotic posture. Continue to recommend CIR level therapies to assist with maximizing functional mobility and safety.     Recommendations for follow up therapy are one component of a multi-disciplinary discharge planning process, led by the attending physician.  Recommendations may be updated based on patient status, additional functional criteria and insurance authorization.  Follow Up Recommendations  Acute inpatient rehab (3hours/day)     Assistance Recommended at Discharge Frequent or constant Supervision/Assistance  Equipment Recommendations  BSC/3in1    Recommendations for Other Services       Precautions / Restrictions Precautions Precautions: Fall;Other (comment) Precaution Comments: SBP<200 Restrictions Weight Bearing Restrictions: No     Mobility  Bed Mobility Overal bed mobility: Needs Assistance Bed Mobility: Supine to Sit;Sit to Supine     Supine to sit: Min assist;+2 for physical assistance Sit to supine: Mod assist;+2 for physical assistance   General bed mobility comments: patient initiating bringing LEs towards EOB and assist for trunk elevation    Transfers Overall transfer level: Needs assistance Equipment used: 2 person hand held assist Transfers: Sit to/from Stand Sit to Stand: Mod assist;+2  safety/equipment;From elevated surface           General transfer comment: modA+2 to rise and steady. Encouragement required to complete    Ambulation/Gait Ambulation/Gait assistance: Mod assist;+2 safety/equipment Gait Distance (Feet): 15 Feet Assistive device: 2 person hand held assist Gait Pattern/deviations: Step-to pattern;Decreased stride length;Wide base of support;Shuffle Gait velocity: decreased     General Gait Details: shuffling gait pattern with wide BOS. HHAx2 for safety and modA+2 for balance throughout. Max encouragement required to perform short mobility. Patient seems to have fear of falling   Stairs             Wheelchair Mobility    Modified Rankin (Stroke Patients Only) Modified Rankin (Stroke Patients Only) Pre-Morbid Rankin Score: No significant disability Modified Rankin: Moderately severe disability     Balance Overall balance assessment: Needs assistance Sitting-balance support: Feet supported;No upper extremity supported Sitting balance-Leahy Scale: Fair     Standing balance support: Bilateral upper extremity supported;No upper extremity supported;During functional activity Standing balance-Leahy Scale: Poor Standing balance comment: reliant on physical A and/or UE support                            Cognition Arousal/Alertness: Awake/alert Behavior During Therapy: WFL for tasks assessed/performed Overall Cognitive Status: History of cognitive impairments - at baseline Area of Impairment: Orientation;Attention;Memory;Following commands;Safety/judgement;Awareness;Problem solving                 Orientation Level: Disoriented to;Time;Place;Situation Current Attention Level: Focused;Sustained Memory: Decreased short-term memory Following Commands: Follows one step commands inconsistently;Follows one step commands with increased time Safety/Judgement: Decreased awareness of safety;Decreased awareness of  deficits Awareness: Intellectual Problem Solving: Requires verbal cues;Requires tactile cues;Slow processing;Decreased initiation;Difficulty sequencing General  Comments: baseline confusion with hx of dementia. Following ~75% of commands with encouragement.        Exercises      General Comments        Pertinent Vitals/Pain Pain Assessment: Faces Faces Pain Scale: Hurts little more Pain Location: generalized with movement Pain Descriptors / Indicators: Grimacing;Guarding Pain Intervention(s): Monitored during session;Repositioned    Home Living                          Prior Function            PT Goals (current goals can now be found in the care plan section) Acute Rehab PT Goals Patient Stated Goal: did not state PT Goal Formulation: With patient/family Time For Goal Achievement: 07/06/21 Potential to Achieve Goals: Fair Progress towards PT goals: Progressing toward goals    Frequency    Min 4X/week      PT Plan Current plan remains appropriate    Co-evaluation PT/OT/SLP Co-Evaluation/Treatment: Yes Reason for Co-Treatment: Necessary to address cognition/behavior during functional activity;For patient/therapist safety;To address functional/ADL transfers PT goals addressed during session: Mobility/safety with mobility;Balance        AM-PAC PT "6 Clicks" Mobility   Outcome Measure  Help needed turning from your back to your side while in a flat bed without using bedrails?: A Lot Help needed moving from lying on your back to sitting on the side of a flat bed without using bedrails?: A Lot Help needed moving to and from a bed to a chair (including a wheelchair)?: Total Help needed standing up from a chair using your arms (e.g., wheelchair or bedside chair)?: Total Help needed to walk in hospital room?: Total Help needed climbing 3-5 steps with a railing? : Total 6 Click Score: 8    End of Session Equipment Utilized During Treatment: Gait  belt Activity Tolerance: Patient tolerated treatment well Patient left: in bed;with call bell/phone within reach;with bed alarm set Nurse Communication: Mobility status PT Visit Diagnosis: Unsteadiness on feet (R26.81);Other abnormalities of gait and mobility (R26.89);Difficulty in walking, not elsewhere classified (R26.2);Other symptoms and signs involving the nervous system (U44.034)     Time: 7425-9563 PT Time Calculation (min) (ACUTE ONLY): 23 min  Charges:  $Gait Training: 8-22 mins                     Darragh Nay A. Dan Humphreys PT, DPT Acute Rehabilitation Services Pager [phone] Office [phone]    Devon Elliott 06/28/2021, 3:54 PM

## 2021-06-28 NOTE — Progress Notes (Signed)
Occupational Therapy Treatment Patient Details Name: Devon Elliott. MRN: [id number] DOB: [birth year] Today's Date: 06/28/2021   History of present illness 87 yo male admitted 12/5 with Lt MCA CVA s/p thrombectomy. Pt with agitation/restlessness post op, he has been getting Seroquel to manage. PMhx: dementia, HLD, HTN, dysphagia, AKI, Afib   OT comments  Patient seen in conjunction with PT this date due to level of alertness and +2 assist.  Patient easily awoke, and able to participate well this date.  Completed bed mobility with Mod A for supine to sit, and +2 Mod A to lie down at the end of the session.  Patient able to complete seated grooming with setup and min A, and was Min A of 2 to ambulate to the toilet this date.  Patient with improved level of alertness this session, and increased ability to express needs and participate overall.  Currently AIR is recommend for post acute rehab, and OT will follow in the acute setting to maximize his functional status.     Recommendations for follow up therapy are one component of a multi-disciplinary discharge planning process, led by the attending physician.  Recommendations may be updated based on patient status, additional functional criteria and insurance authorization.    Follow Up Recommendations  Acute inpatient rehab (3hours/day)    Assistance Recommended at Discharge Frequent or constant Supervision/Assistance  Equipment Recommendations  BSC/3in1;Tub/shower seat    Recommendations for Other Services      Precautions / Restrictions Precautions Precautions: Fall;Other (comment) Precaution Comments: SBP<200 Restrictions Weight Bearing Restrictions: No       Mobility Bed Mobility Overal bed mobility: Needs Assistance Bed Mobility: Supine to Sit;Sit to Supine     Supine to sit: Min assist;+2 for physical assistance Sit to supine: Mod assist;+2 for physical assistance   General bed mobility comments: patient initiating  bringing LEs towards EOB and assist for trunk elevation    Transfers Overall transfer level: Needs assistance Equipment used: 2 person hand held assist Transfers: Sit to/from Stand Sit to Stand: Mod assist;+2 safety/equipment;From elevated surface   Step pivot transfers: Min assist;+2 safety/equipment       General transfer comment: modA+2 to rise and steady. Encouragement required to complete     Balance Overall balance assessment: Needs assistance Sitting-balance support: Feet supported;No upper extremity supported Sitting balance-Leahy Scale: Fair   Postural control: Posterior lean Standing balance support: Bilateral upper extremity supported;No upper extremity supported;During functional activity Standing balance-Leahy Scale: Poor Standing balance comment: reliant on physical A and/or UE support                           ADL either performed or assessed with clinical judgement   ADL       Grooming: Wash/dry face;Minimal assistance;Sitting           Upper Body Dressing : Moderate assistance;Sitting   Lower Body Dressing: Maximal assistance;Sit to/from stand                      Extremity/Trunk Assessment Upper Extremity Assessment Upper Extremity Assessment: Generalized weakness   Lower Extremity Assessment Lower Extremity Assessment: Defer to PT evaluation   Cervical / Trunk Assessment Cervical / Trunk Assessment: Normal                      Cognition Arousal/Alertness: Awake/alert Behavior During Therapy: WFL for tasks assessed/performed Overall Cognitive Status: History of cognitive impairments - at baseline  Area of Impairment: Orientation;Attention;Memory;Following commands;Safety/judgement;Awareness;Problem solving                 Orientation Level: Disoriented to;Time;Place;Situation Current Attention Level: Focused;Sustained Memory: Decreased short-term memory Following Commands: Follows one step commands  inconsistently;Follows one step commands with increased time Safety/Judgement: Decreased awareness of safety;Decreased awareness of deficits Awareness: Intellectual Problem Solving: Requires verbal cues;Requires tactile cues;Slow processing;Decreased initiation;Difficulty sequencing General Comments: baseline confusion with hx of dementia. Following ~75% of commands with encouragement.                            Pertinent Vitals/ Pain       Pain Assessment: Faces Faces Pain Scale: Hurts little more Pain Location: generalized with movement Pain Descriptors / Indicators: Grimacing;Guarding Pain Intervention(s): Monitored during session                                                          Frequency  Min 2X/week        Progress Toward Goals  OT Goals(current goals can now be found in the care plan section)  Progress towards OT goals: Progressing toward goals  Acute Rehab OT Goals OT Goal Formulation: Patient unable to participate in goal setting Time For Goal Achievement: 07/06/21 Potential to Achieve Goals: Fair  Plan Discharge plan remains appropriate    Co-evaluation    PT/OT/SLP Co-Evaluation/Treatment: Yes Reason for Co-Treatment: Complexity of the patient's impairments (multi-system involvement);Necessary to address cognition/behavior during functional activity;For patient/therapist safety PT goals addressed during session: Mobility/safety with mobility;Balance OT goals addressed during session: ADL's and self-care      AM-PAC OT "6 Clicks" Daily Activity     Outcome Measure   Help from another person eating meals?: A Lot Help from another person taking care of personal grooming?: A Lot Help from another person toileting, which includes using toliet, bedpan, or urinal?: A Lot Help from another person bathing (including washing, rinsing, drying)?: A Lot Help from another person to put on and taking off regular upper body  clothing?: A Lot Help from another person to put on and taking off regular lower body clothing?: Total 6 Click Score: 11    End of Session Equipment Utilized During Treatment: Gait belt;Oxygen  OT Visit Diagnosis: Unsteadiness on feet (R26.81);Other abnormalities of gait and mobility (R26.89);Muscle weakness (generalized) (M62.81);Other symptoms and signs involving cognitive function   Activity Tolerance Patient tolerated treatment well   Patient Left in bed;with call bell/phone within reach;with bed alarm set   Nurse Communication          Time: 6270-3500 OT Time Calculation (min): 23 min  Charges: OT General Charges $OT Visit: 1 Visit OT Treatments $Self Care/Home Management : 8-22 mins  06/28/2021  RP, OTR/L  Acute Rehabilitation Services  Office:  [phone]   Suzanna Obey 06/28/2021, 4:11 PM

## 2021-06-28 NOTE — Progress Notes (Signed)
Palliative Care Progress Note, Assessment & Plan   Patient Name: Devon Elliott.       Date: 06/28/2021 DOB: April 01, 1927  Age: 87 y.o. MRN#: [id number] Attending Physician: Karie Kirks, DO Primary Care Physician: Rusty Aus, MD Admit Date: 06/19/2021  Reason for Consultation/Follow-up: Establishing goals of care  Subjective: Patient is sitting in bed in no apparent distress.  Wife and son are at bedside.  Meal tray is at bedside and wife is attempting to feed soup and lasagna to patient.  He starts a sentence but then repeats the same word and his speech trails off.  He is pleasant and waves and says hello when I enter the room.  HPI: Patient is a 87 year old male with a past medical history significant for HTN, HLD, A. fib (no anticoagulant (who presented to [facility] on 12/5 with aphasia and right-sided weakness.  CTA revealed distal left M2 occlusion.  Patient was transferred to [facility] and did not receive tPA as he was deemed to be outside of the window.  Mechanical thrombectomy performed by IR. Episodes of agitation occurred s/p procedure. Plan is for pt to transfer to CIR when bed available.   PMT was consulted to discuss [place].   Plan of Care: I have reviewed medical records including EPIC notes, labs and imaging, assessed the patient and then met with patient's son Mortimer Fries to discuss diagnosis prognosis, GOC, EOL wishes, disposition, and options.  Mortimer Fries lives in [place] and self-reports he is not involved in his father's care.  He is concerned that the patient may not be accepted at CIR.  I shared that from a chart review and understanding of the patient's current plan that it is the family's wishes that he go to rehab.  I reviewed that the patient's cognitive, functional, and nutritional status  will be attempt to be maximized with rehab services. Mortimer Fries shared that the patient "cannot do shit for himself".  We discussed patient's current illness and what it means in the larger context of patient's on-going co-morbidities. I attempted to elicit values and goals of care important to the patient.  I shared that my understanding of the family's goals for the patient is that he attend rehab to get as well as possible.  Mortimer Fries said that yes, these are the family's wishes at this time.    Based on the family and pateitn's goals of care, I agree with the recommendation that the patient could benefit from CIR.  Mortimer Fries is concerned that the patient will be there for 2 weeks and then return home. Thus, the difference between aggressive medical intervention and comfort care was considered in light of the patient's goals of care.    Hospice services described in detail.  Education given that hospice benefits can be enacted at any time.  Mortimer Fries was under the impression that hospice could not be called or utilized without proper approval.  I shared there would be an evaluation for eligibility but that given his age and significant medical history he would likely be able to enact his federally funded benefit of hospice.  Questions and concerns were addressed. Mortimer Fries and family were encouraged to call with questions or concerns.   Discussed  with patient/family the importance of continued conversation with family and the medical providers regarding overall plan of care and treatment options, ensuring decisions are within the context of the patients values and GOCs.    Care plan was discussed with patient, patient's wife, patient's son  Code Status: DNR  Prognosis:  Unable to determine  Discharge Planning: CIR  Physical Exam Constitutional:      General: He is not in acute distress.    Appearance: He is not toxic-appearing.  HENT:     Head: Normocephalic and atraumatic.     Mouth/Throat:     Mouth:  Mucous membranes are moist.  Cardiovascular:     Rate and Rhythm: Normal rate.     Pulses: Normal pulses.  Pulmonary:     Effort: Pulmonary effort is normal.  Abdominal:     Palpations: Abdomen is soft.  Musculoskeletal:     Comments: Generalized weakness  Skin:    General: Skin is warm and dry.  Neurological:     Mental Status: He is alert.     Comments: Oriented to self  Psychiatric:        Mood and Affect: Mood normal.                Palliative Assessment/Data: 50%    Flowsheet Rows    Flowsheet Row Most Recent Value  Intake Tab   Referral Department Critical care  Unit at Time of Referral ICU  Palliative Care Primary Diagnosis Neurology  Date Notified 06/21/21  Palliative Care Type New Palliative care  Reason for referral Clarify Goals of Care  Date of Admission 06/19/21  Date first seen by Palliative Care 06/21/21  # of days Palliative referral response time 0 Day(s)  # of days IP prior to Palliative referral 2  Clinical Assessment   Psychosocial & Spiritual Assessment   Palliative Care Outcomes         Total Time 15 minutes  Greater than 50%  of this time was spent counseling and coordinating care related to the above assessment and plan.  Thank you for allowing the Palliative Medicine Team to assist in the care of this patient.  [place] Ilsa Iha, FNP-BC Palliative Medicine Team Team Phone # [phone]

## 2021-06-28 NOTE — Progress Notes (Signed)
PROGRESS NOTE  Devon Elliott. TFT:[id number] DOB: 27-Mar-1927 DOA: 06/19/2021 PCP: Danella Penton, MD  Brief History   87 year old male with history of hypertension, hyperlipidemia, atrial fibrillation not on anticoagulation presented to [facility] on 12/5 with aphasia and right-sided weakness.  CTA showed distal left M2 occlusion.  Patient was brought to [name] for neuro intervention radiology mechanical thrombectomy.  Patient did not receive tPA as he was deemed to be outside of the window. Patient underwent mechanical thrombectomy per IR, after the procedure he became agitated and restless.  He received diphenhydramine and Haldol with Ativan.  Patient became somnolent with mild desaturation into 80s on 3 L of oxygen.  Mental status was depressed, he was minimally responsive to noxious stimuli.  PCCM was subsequently consulted for airway protection. Patient received Romazicon for reversal He was transferred out of ICU [facility]  assumed care on 06/23/2021.  The patient has been evaluated by PT/OT. Their recommendation has been for CIR. He has been accepted for CIR and is awaiting bed placement.   Consultants  PCCM Palliative Care  Procedures  None  Antibiotics   Anti-infectives (From admission, onward)    None      Subjective  This patient is resting comfortably. Both the patient's son, Nadine Counts and the patient's spouse, June, are at bedside. Nadine Counts endorses June's role as Management consultant for the patient.  Objective   Vitals:  Vitals:   06/28/21 1119 06/28/21 1513  BP: 129/74 (!) 123/91  Pulse: 83 80  Resp: 20 20  Temp: 98.5 F (36.9 C) 98.2 F (36.8 C)  SpO2: 97% 95%   Exam:  Constitutional:  The patient is awake, alert, and oriented x 2. No acute distress. He is enjoying the visit with his wife. Respiratory:  No increased work of breathing. No wheezes, rales, or rhonchi No tactile fremitus Cardiovascular:  Regular rate and rhythm No murmurs, ectopy, or gallups. No  lateral PMI. No thrills. Abdomen:  Abdomen is soft, non-tender, non-distended No hernias, masses, or organomegaly Normoactive bowel sounds.  Musculoskeletal:  No cyanosis, clubbing, or edema Skin:  No rashes, lesions, ulcers palpation of skin: no induration or nodules Neurologic:  CN 2-12 intact No focal deficits Speech is nonsensical Psychiatric:  Mental status - confused   I have personally reviewed the following:   Today's Data  Vitals  Lab Data  BMP CBC  Imaging    Cardiology Data  EKG Echocardiogram  Scheduled Meds:  apixaban  5 mg Oral BID   atorvastatin  40 mg Oral Daily   Chlorhexidine Gluconate Cloth  6 each Topical Daily   divalproex  125 mg Oral Q8H   memantine  5 mg Oral q morning   metoprolol tartrate  25 mg Oral BID   pantoprazole  40 mg Oral Q1200   QUEtiapine  25 mg Oral QHS   QUEtiapine  25 mg Oral Q0600    No problems updated.     LOS: 9 days   A & P  Left middle cerebral artery infarct -MRI brain showed left MCA infarct in posterior left frontoparietal region -Secondary to embolism from atrial fibrillation -S/p mechanical thrombectomy of left MCA M2 occlusion -Excellent revascularization achieved -Patient started on heparin GTT due to atrial fibrillation -Heparin was changed to Lovenox which was changed to  p.o. Eliquis -Plan to go to CIR when bed is available   Epigastric discomfort ?  GERD -Start Protonix 40 mg p.o. daily - No further complaints.   Delirium -Improved with wife  at bedside. -Developed after thrombectomy -Fluctuating course; Pt is better today. -Was in the ICU, required Precedex, he has been weaned off Precedex -Patient did receive Romazicon for reversal of benzodiazepines -Started on Depakote 125 mg p.o. every 8 hours; continue Seroquel 25 mg p.o. nightly -Continue delirium precautions   Acute kidney injury -Creatinine was 1.19 today; started on IV normal saline at 100 mL/h -IV fluids stopped -AKI  resolved.   Hypokalemia -Resolved. Potassium is 3.5 after replacement with potassium -Serum magnesium obtained yesterday was 1.6 - supplemented - Continue to monitor magnesium and potassium and supplement as necessary.   Acute hypoxemic respiratory failure - multifactorial; pulmonary edema, atelectasis -Resolved - Patient is currently saturating 96% on room air.  -Continue I-S, flutter valve   Atrial fibrillation -Heart rate controlled, continue metoprolol 25 mg p.o. twice daily -Started on heparin GTT -Heparin was switched to p.o. Eliquis   HFrEF -Currently euvolemic -EF 45% with mildly decreased function and global hypokinesis as per echocardiogram of 06/19/2021. -Received IV fluids briefly as above -He is off IV fluids now   Hyperlipidemia -Continue atorvastatin 40 mg daily   Hypertension -Blood pressure is elevated -Continue p.o. metoprolol and add amlodipine.   Goals of care -Discussed with patient and son at bedside, he understands that patient may not do well.  He did not want any aggressive interventions in case patient confusion does not improve and has poor p.o. intake.  We will continue to monitor over the next few days.  Palliative care following.   I have seen and examined this patient myself. I have spent 32 minutes in his evaluation and care.  DVT prophylaxis: Eilquis Code Status: DNR Family Communication: None available Disposition Plan: CIR    Oiva Dibari, DO [facility] Direct contact: see [email or web address]  7PM-7AM contact night coverage as above 06/28/2021, 6:03 PM  LOS: 8 days

## 2021-06-28 NOTE — Progress Notes (Addendum)
Inpatient Rehab Admissions Coordinator:   I do not have a CIR bed for this Pt.today. I did get insurance auth late yesterday.  I will continue to follow for potential admission pending bed availability. Megan Salon, MS, CCC-SLP Rehab Admissions Coordinator  [phone] (celll) [phone] (office)

## 2021-06-29 ENCOUNTER — Inpatient Hospital Stay (HOSPITAL_COMMUNITY)
Admission: RE | Admit: 2021-06-29 | Discharge: 2021-07-18 | DRG: 057 | Disposition: A | Discharge status: Home Health | Payer: Medicare Other | Source: Intra-hospital | Attending: Physical Medicine & Rehabilitation | Admitting: Physical Medicine & Rehabilitation

## 2021-06-29 DIAGNOSIS — Z974 Presence of external hearing-aid: Secondary | ICD-10-CM

## 2021-06-29 DIAGNOSIS — I69390 Apraxia following cerebral infarction: Secondary | ICD-10-CM | POA: Diagnosis not present

## 2021-06-29 DIAGNOSIS — I69398 Other sequelae of cerebral infarction: Secondary | ICD-10-CM

## 2021-06-29 DIAGNOSIS — R1310 Dysphagia, unspecified: Secondary | ICD-10-CM | POA: Diagnosis present

## 2021-06-29 DIAGNOSIS — Z79899 Other long term (current) drug therapy: Secondary | ICD-10-CM | POA: Diagnosis not present

## 2021-06-29 DIAGNOSIS — R310 Gross hematuria: Secondary | ICD-10-CM | POA: Diagnosis not present

## 2021-06-29 DIAGNOSIS — I69391 Dysphagia following cerebral infarction: Secondary | ICD-10-CM

## 2021-06-29 DIAGNOSIS — E7800 Pure hypercholesterolemia, unspecified: Secondary | ICD-10-CM | POA: Diagnosis present

## 2021-06-29 DIAGNOSIS — I110 Hypertensive heart disease with heart failure: Secondary | ICD-10-CM | POA: Diagnosis present

## 2021-06-29 DIAGNOSIS — Z7982 Long term (current) use of aspirin: Secondary | ICD-10-CM

## 2021-06-29 DIAGNOSIS — Z888 Allergy status to other drugs, medicaments and biological substances status: Secondary | ICD-10-CM

## 2021-06-29 DIAGNOSIS — I5022 Chronic systolic (congestive) heart failure: Secondary | ICD-10-CM | POA: Diagnosis present

## 2021-06-29 DIAGNOSIS — I749 Embolism and thrombosis of unspecified artery: Secondary | ICD-10-CM | POA: Diagnosis present

## 2021-06-29 DIAGNOSIS — Z800 Family history of malignant neoplasm of digestive organs: Secondary | ICD-10-CM

## 2021-06-29 DIAGNOSIS — N390 Urinary tract infection, site not specified: Secondary | ICD-10-CM | POA: Diagnosis not present

## 2021-06-29 DIAGNOSIS — Z87891 Personal history of nicotine dependence: Secondary | ICD-10-CM | POA: Diagnosis not present

## 2021-06-29 DIAGNOSIS — R339 Retention of urine, unspecified: Secondary | ICD-10-CM | POA: Diagnosis present

## 2021-06-29 DIAGNOSIS — I4891 Unspecified atrial fibrillation: Secondary | ICD-10-CM | POA: Diagnosis present

## 2021-06-29 DIAGNOSIS — B9620 Unspecified Escherichia coli [E. coli] as the cause of diseases classified elsewhere: Secondary | ICD-10-CM | POA: Diagnosis not present

## 2021-06-29 DIAGNOSIS — M79641 Pain in right hand: Secondary | ICD-10-CM

## 2021-06-29 DIAGNOSIS — F03A11 Unspecified dementia, mild, with agitation: Secondary | ICD-10-CM | POA: Diagnosis present

## 2021-06-29 DIAGNOSIS — R509 Fever, unspecified: Secondary | ICD-10-CM

## 2021-06-29 DIAGNOSIS — R2689 Other abnormalities of gait and mobility: Secondary | ICD-10-CM | POA: Diagnosis present

## 2021-06-29 DIAGNOSIS — Z833 Family history of diabetes mellitus: Secondary | ICD-10-CM

## 2021-06-29 DIAGNOSIS — I69320 Aphasia following cerebral infarction: Secondary | ICD-10-CM | POA: Diagnosis not present

## 2021-06-29 DIAGNOSIS — F802 Mixed receptive-expressive language disorder: Secondary | ICD-10-CM | POA: Diagnosis present

## 2021-06-29 DIAGNOSIS — Z66 Do not resuscitate: Secondary | ICD-10-CM | POA: Diagnosis present

## 2021-06-29 DIAGNOSIS — Z881 Allergy status to other antibiotic agents status: Secondary | ICD-10-CM

## 2021-06-29 DIAGNOSIS — R7989 Other specified abnormal findings of blood chemistry: Secondary | ICD-10-CM | POA: Diagnosis not present

## 2021-06-29 DIAGNOSIS — M109 Gout, unspecified: Secondary | ICD-10-CM | POA: Diagnosis not present

## 2021-06-29 DIAGNOSIS — R4587 Impulsiveness: Secondary | ICD-10-CM | POA: Diagnosis present

## 2021-06-29 DIAGNOSIS — I69351 Hemiplegia and hemiparesis following cerebral infarction affecting right dominant side: Secondary | ICD-10-CM | POA: Diagnosis present

## 2021-06-29 DIAGNOSIS — I69319 Unspecified symptoms and signs involving cognitive functions following cerebral infarction: Secondary | ICD-10-CM

## 2021-06-29 DIAGNOSIS — R410 Disorientation, unspecified: Secondary | ICD-10-CM | POA: Diagnosis not present

## 2021-06-29 DIAGNOSIS — I480 Paroxysmal atrial fibrillation: Secondary | ICD-10-CM | POA: Diagnosis not present

## 2021-06-29 LAB — BASIC METABOLIC PANEL
Anion gap: 9 (ref 5–15)
BUN: 26 mg/dL — ABNORMAL HIGH (ref 8–23)
CO2: 24 mmol/L (ref 22–32)
Calcium: 8.5 mg/dL — ABNORMAL LOW (ref 8.9–10.3)
Chloride: 109 mmol/L (ref 98–111)
Creatinine, Ser: 1.29 mg/dL — ABNORMAL HIGH (ref 0.61–1.24)
GFR, Estimated: 52 mL/min — ABNORMAL LOW (ref >60)
Glucose, Bld: 115 mg/dL — ABNORMAL HIGH (ref 70–99)
Potassium: 4.1 mmol/L (ref 3.5–5.1)
Sodium: 142 mmol/L (ref 135–145)

## 2021-06-29 LAB — MAGNESIUM: Magnesium: 2.4 mg/dL (ref 1.7–2.4)

## 2021-06-29 MED ORDER — ATORVASTATIN CALCIUM 40 MG PO TABS: 40.0000 mg | ORAL_TABLET | Freq: Every day | ORAL | 0 refills | Status: AC

## 2021-06-29 MED ORDER — QUETIAPINE FUMARATE 25 MG PO TABS: 25.0000 mg | ORAL_TABLET | Freq: Every day | ORAL | 0 refills | Status: AC

## 2021-06-29 MED ORDER — MEMANTINE HCL 5 MG PO TABS
5.0000 mg | ORAL_TABLET | Freq: Every morning | ORAL | Status: DC
  Administered 2021-06-30 – 2021-07-18 (×19): 5 mg via ORAL
  Filled 2021-06-29 (×19): qty 1

## 2021-06-29 MED ORDER — POLYETHYLENE GLYCOL 3350 17 G PO PACK
17.0000 g | PACK | Freq: Every day | ORAL | Status: DC | PRN
  Administered 2021-07-11 – 2021-07-12 (×2): 17 g via ORAL
  Filled 2021-06-29: qty 1

## 2021-06-29 MED ORDER — DIVALPROEX SODIUM 125 MG PO CSDR
125.0000 mg | DELAYED_RELEASE_CAPSULE | Freq: Three times a day (TID) | ORAL | Status: DC
  Administered 2021-06-29 – 2021-07-18 (×54): 125 mg via ORAL
  Filled 2021-06-29 (×56): qty 1

## 2021-06-29 MED ORDER — LIDOCAINE HCL URETHRAL/MUCOSAL 2 % EX GEL: 1.0000 "application " | CUTANEOUS | Status: DC | PRN

## 2021-06-29 MED ORDER — ALUM & MAG HYDROXIDE-SIMETH 200-200-20 MG/5ML PO SUSP: 30.0000 mL | ORAL | Status: DC | PRN

## 2021-06-29 MED ORDER — BLOOD PRESSURE CONTROL BOOK
Freq: Once | Status: AC
  Filled 2021-06-29: qty 1

## 2021-06-29 MED ORDER — APIXABAN 5 MG PO TABS
5.0000 mg | ORAL_TABLET | Freq: Two times a day (BID) | ORAL | Status: DC
  Administered 2021-06-29 – 2021-07-18 (×38): 5 mg via ORAL
  Filled 2021-06-29 (×38): qty 1

## 2021-06-29 MED ORDER — ALBUTEROL SULFATE (2.5 MG/3ML) 0.083% IN NEBU: 2.5000 mg | INHALATION_SOLUTION | RESPIRATORY_TRACT | Status: DC | PRN

## 2021-06-29 MED ORDER — PROCHLORPERAZINE MALEATE 5 MG PO TABS: 5.0000 mg | ORAL_TABLET | Freq: Four times a day (QID) | ORAL | Status: DC | PRN

## 2021-06-29 MED ORDER — APIXABAN 5 MG PO TABS: 5.0000 mg | ORAL_TABLET | Freq: Two times a day (BID) | ORAL | 0 refills | Status: AC

## 2021-06-29 MED ORDER — FLEET ENEMA 7-19 GM/118ML RE ENEM: 1.0000 | ENEMA | Freq: Once | RECTAL | Status: DC | PRN

## 2021-06-29 MED ORDER — QUETIAPINE FUMARATE 25 MG PO TABS
25.0000 mg | ORAL_TABLET | Freq: Four times a day (QID) | ORAL | Status: DC | PRN
  Administered 2021-07-06 – 2021-07-13 (×5): 25 mg via ORAL
  Filled 2021-06-29 (×7): qty 1

## 2021-06-29 MED ORDER — BISACODYL 10 MG RE SUPP
10.0000 mg | Freq: Every day | RECTAL | Status: DC | PRN
  Administered 2021-07-01: 10 mg via RECTAL
  Filled 2021-06-29: qty 1

## 2021-06-29 MED ORDER — GUAIFENESIN-DM 100-10 MG/5ML PO SYRP: 5.0000 mL | ORAL_SOLUTION | Freq: Four times a day (QID) | ORAL | Status: DC | PRN

## 2021-06-29 MED ORDER — METOPROLOL TARTRATE 25 MG PO TABS
25.0000 mg | ORAL_TABLET | Freq: Two times a day (BID) | ORAL | Status: DC
  Administered 2021-06-29 – 2021-07-11 (×23): 25 mg via ORAL
  Filled 2021-06-29 (×24): qty 1

## 2021-06-29 MED ORDER — PROSOURCE PLUS PO LIQD
30.0000 mL | Freq: Two times a day (BID) | ORAL | Status: DC
  Administered 2021-06-30 – 2021-07-18 (×32): 30 mL via ORAL
  Filled 2021-06-29 (×33): qty 30

## 2021-06-29 MED ORDER — PROCHLORPERAZINE EDISYLATE 10 MG/2ML IJ SOLN: 5.0000 mg | Freq: Four times a day (QID) | INTRAMUSCULAR | Status: DC | PRN

## 2021-06-29 MED ORDER — ACETAMINOPHEN 325 MG PO TABS
325.0000 mg | ORAL_TABLET | ORAL | Status: DC | PRN
  Administered 2021-07-03 – 2021-07-16 (×4): 650 mg via ORAL
  Filled 2021-06-29 (×3): qty 2

## 2021-06-29 MED ORDER — ATORVASTATIN CALCIUM 40 MG PO TABS
40.0000 mg | ORAL_TABLET | Freq: Every day | ORAL | Status: DC
  Administered 2021-06-30 – 2021-07-18 (×19): 40 mg via ORAL
  Filled 2021-06-29 (×19): qty 1

## 2021-06-29 MED ORDER — PROCHLORPERAZINE 25 MG RE SUPP: 12.5000 mg | Freq: Four times a day (QID) | RECTAL | Status: DC | PRN

## 2021-06-29 MED ORDER — DIVALPROEX SODIUM 125 MG PO CSDR: 125.0000 mg | DELAYED_RELEASE_CAPSULE | Freq: Three times a day (TID) | ORAL | 0 refills | Status: AC

## 2021-06-29 MED ORDER — PANTOPRAZOLE SODIUM 40 MG PO TBEC: 40.0000 mg | DELAYED_RELEASE_TABLET | Freq: Every day | ORAL | 0 refills | Status: AC

## 2021-06-29 MED ORDER — QUETIAPINE FUMARATE 25 MG PO TABS
25.0000 mg | ORAL_TABLET | Freq: Every day | ORAL | Status: DC
  Administered 2021-06-30 – 2021-07-18 (×18): 25 mg via ORAL
  Filled 2021-06-29 (×19): qty 1

## 2021-06-29 MED ORDER — PANTOPRAZOLE SODIUM 40 MG PO TBEC
40.0000 mg | DELAYED_RELEASE_TABLET | Freq: Every day | ORAL | Status: DC
  Administered 2021-06-30 – 2021-07-07 (×8): 40 mg via ORAL
  Filled 2021-06-29 (×8): qty 1

## 2021-06-29 MED ORDER — METOPROLOL TARTRATE 25 MG PO TABS: 25.0000 mg | ORAL_TABLET | Freq: Two times a day (BID) | ORAL | 0 refills | Status: DC

## 2021-06-29 MED ORDER — QUETIAPINE FUMARATE 25 MG PO TABS
25.0000 mg | ORAL_TABLET | Freq: Every day | ORAL | Status: DC
  Administered 2021-06-29 – 2021-07-17 (×18): 25 mg via ORAL
  Filled 2021-06-29 (×19): qty 1

## 2021-06-29 NOTE — Discharge Summary (Signed)
Physician Discharge Summary  Devon Elliott. ZOX:[id number] DOB: 25-Jan-1927 DOA: 06/19/2021  PCP: Danella Penton, MD  Admit date: 06/19/2021 Discharge date: 06/29/2021  Recommendations for Outpatient Follow-up:  Discharge to CIR Follow up with PCP in 7-10 days after discharge from CIR. Chemistry to be drawn on PCP visit and reported to PCP. Follow up with Neurology at [facility] in 4 weeks.   Follow-up Information     [facility]. Schedule an appointment as soon as possible for a visit in 1 month(s).   Specialty: Neurology Why: stroke clinic Contact information: [address] [place] [address] [phone]               Discharge Diagnoses: Principal diagnosis is #1 Left middle cerebral artery infarct Epigastric discomfort likely due to gastritis Delirium AKI Hypokalemia Acute hypoxemia respiratory failure Atrial fibrillation HFrEF Hyperlipidemia  DNR  Discharge Condition: Fair Disposition: CIR  Diet recommendation: Heart healthy/DYS 3  Filed Weights   06/29/21 0700  Weight: 67.2 kg    History of present illness: Devon Elliott. is a 87 y.o. male with PMH significant for hypertension, atrial fibrillation on anticoagulation who initially presented to [facility] for aphasia.  Patient was evaluated by Dr. Su Ley with neurology team at [facility]. Per wife, had speech difficulty yesterday afternoon and not feeling went. Woke up this AM and was fine. Dressed himself and sat on the recliner when he had acute onset and severe word finding difficulties with some right-sided weakness.  This happened around 0900 on 06/19/2021.  EMS was called and patient was brought into [facility] where CT head without contrast with aspects of 10.  CT angio head and neck with distal left MCA M2 occlusion. CTP with no core and a mismatch.   Case was discussed with patient's wife and with Neuro IR and given the  disabling nature of his deficit, he was transferred to [facility] for emergent thrombectomy.   Hospital Course:  87 year old male with history of hypertension, hyperlipidemia, atrial fibrillation not on anticoagulation presented to [facility] on 12/5 with aphasia and right-sided weakness.  CTA showed distal left M2 occlusion.  Patient was brought to [name] for neuro intervention radiology mechanical thrombectomy.  Patient did not receive tPA as he was deemed to be outside of the window. Patient underwent mechanical thrombectomy per IR, after the procedure he became agitated and restless.  He received diphenhydramine and Haldol with Ativan.  Patient became somnolent with mild desaturation into 80s on 3 L of oxygen.  Mental status was depressed, he was minimally responsive to noxious stimuli.  PCCM was subsequently consulted for airway protection. Patient received Romazicon for reversal He was transferred out of ICU [facility]  assumed care on 06/23/2021.   The patient has been evaluated by PT/OT. Their recommendation has been for CIR. He has been accepted for CIR and is awaiting bed placement.   Today's assessment: S: The patient is resting comfortably. No new complaints.  O: Vitals:  Vitals:   06/29/21 0717 06/29/21 1123  BP: (!) 140/91 (!) 146/78  Pulse:  100  Resp: 20 17  Temp: 97.9 F (36.6 C) 98 F (36.7 C)  SpO2: 97% 97%    Exam:   Constitutional:  The patient is awake, alert, and oriented x 2. No acute distress. He is enjoying the visit with his wife. Respiratory:  No increased work of breathing. No wheezes, rales, or rhonchi No tactile fremitus Cardiovascular:  Regular rate  and rhythm No murmurs, ectopy, or gallups. No lateral PMI. No thrills. Abdomen:  Abdomen is soft, non-tender, non-distended No hernias, masses, or organomegaly Normoactive bowel sounds.  Musculoskeletal:  No cyanosis, clubbing, or edema Skin:  No rashes, lesions, ulcers palpation of skin: no induration  or nodules Neurologic:  CN 2-12 intact No focal deficits Speech is nonsensical Psychiatric:  Mental status - confused   Discharge Instructions  Discharge Instructions     Activity as tolerated - No restrictions   Complete by: As directed    Ambulatory referral to Neurology   Complete by: As directed    Follow up with stroke clinic NP (Jessica Vanschaick or Darrol Angel, if both not available, consider Manson Allan, or Ahern) at [facility] in about 4 weeks. Thanks.   Call MD for:  difficulty breathing, headache or visual disturbances   Complete by: As directed    Call MD for:  persistant dizziness or light-headedness   Complete by: As directed    Call MD for:  persistant nausea and vomiting   Complete by: As directed    Call MD for:  temperature >100.4   Complete by: As directed    Diet - low sodium heart healthy   Complete by: As directed    Increase activity slowly   Complete by: As directed    No wound care   Complete by: As directed       Allergies as of 06/29/2021       Reactions   Ace Inhibitors    Other reaction(s): Kidney Disorder   Clarithromycin Hives   Etodolac    Other reaction(s): Kidney Disorder   Levofloxacin Swelling        Medication List     STOP taking these medications    aspirin 81 MG EC tablet   metoprolol succinate 25 MG 24 hr tablet Commonly known as: TOPROL-XL   traMADol 50 MG tablet Commonly known as: ULTRAM       TAKE these medications    acetaminophen 500 MG tablet Commonly known as: TYLENOL Take 1,000 mg by mouth every 8 (eight) hours as needed for mild pain.   apixaban 5 MG Tabs tablet Commonly known as: ELIQUIS Take 1 tablet (5 mg total) by mouth 2 (two) times daily.   atorvastatin 40 MG tablet Commonly known as: LIPITOR Take 1 tablet (40 mg total) by mouth daily. Start taking on: June 30, 2021   Cholecalciferol 25 MCG (1000 UT) capsule Take 1,000 Units by mouth daily.   divalproex 125 MG  capsule Commonly known as: DEPAKOTE SPRINKLE Take 1 capsule (125 mg total) by mouth every 8 (eight) hours.   memantine 5 MG tablet Commonly known as: NAMENDA Take 5 mg by mouth every evening.   metoprolol tartrate 25 MG tablet Commonly known as: LOPRESSOR Take 1 tablet (25 mg total) by mouth 2 (two) times daily.   pantoprazole 40 MG tablet Commonly known as: PROTONIX Take 1 tablet (40 mg total) by mouth daily at 12 noon. Start taking on: June 30, 2021 What changed: when to take this   QUEtiapine 25 MG tablet Commonly known as: SEROQUEL Take 1 tablet (25 mg total) by mouth at bedtime.   QUEtiapine 25 MG tablet Commonly known as: SEROQUEL Take 1 tablet (25 mg total) by mouth daily at 6 (six) AM. Start taking on: June 30, 2021       Allergies  Allergen Reactions   Ace Inhibitors     Other reaction(s): Kidney Disorder   Clarithromycin  Hives   Etodolac     Other reaction(s): Kidney Disorder   Levofloxacin Swelling    The results of significant diagnostics from this hospitalization (including imaging, microbiology, ancillary and laboratory) are listed below for reference.    Significant Diagnostic Studies: CT ANGIO HEAD W OR WO CONTRAST  Result Date: 06/21/2021 CLINICAL DATA:  Stroke follow-up. Recent thrombectomy for left M2 occlusion. EXAM: CT ANGIOGRAPHY HEAD TECHNIQUE: Multidetector CT imaging of the head was performed using the standard protocol during bolus administration of intravenous contrast. Multiplanar CT image reconstructions and MIPs were obtained to evaluate the vascular anatomy. CONTRAST:  75mL OMNIPAQUE IOHEXOL 350 MG/ML SOLN COMPARISON:  Head CT and CTA 06/19/2021 FINDINGS: CT HEAD Brain: No definite acute cortically based infarct, intracranial hemorrhage, mass, midline shift, or extra-axial fluid collection is identified. Confluent hypodensities in the cerebral white matter bilaterally are unchanged and nonspecific but compatible with extensive  chronic small vessel ischemic disease. There is mild cerebral atrophy. Vascular: Calcified atherosclerosis at the skull base. Skull: No fracture or suspicious osseous lesion. Sinuses: Paranasal sinuses and mastoid air cells are clear. Other: Bilateral cataract extraction. CTA HEAD Anterior circulation: The included internal carotid arteries are patent with unchanged mild stenosis of the distal left cervical ICA and no significant intracranial ICA stenosis. ACAs and MCAs are patent without evidence of a proximal branch occlusion. The revascularized left M2 vessel remains patent. There unchanged moderate right M1, moderate proximal left M2, and severe proximal left A1 stenoses. No aneurysm is identified. Posterior circulation: The intracranial left vertebral artery is widely patent and supplies the basilar. 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 Patent left PICA, bilateral AICA, and bilateral SCA origins are identified. The basilar artery is widely patent. There is a large left posterior communicating artery. Both PCAs are patent with unchanged mild right P1 narrowing and moderate to severe stenoses of the distal left P2 segment and right P2 bifurcation. No aneurysm is identified. Venous sinuses: As permitted by contrast timing, patent. Anatomic variants: None. Review of the MIP images confirms the above findings. IMPRESSION: 1. No evidence of acute intracranial abnormality. No new intracranial large vessel occlusion. 2. Persistent patency of the revascularized left M2 vessel. 3. 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 4. Unchanged intracranial atherosclerosis with moderate to severe anterior and posterior circulation stenoses as detailed above. Electronically Signed   By: Sebastian Ache M.D.   On: 06/21/2021 12:24   MR BRAIN WO CONTRAST  Result Date: 06/24/2021 CLINICAL DATA:  Follow-up examination for acute stroke. EXAM: MRI HEAD WITHOUT  CONTRAST TECHNIQUE: Multiplanar, multiecho pulse sequences of the brain and surrounding structures were obtained without intravenous contrast. COMPARISON:  Multiple previous CTs dating back to 06/19/2021. FINDINGS: Brain: Diffuse prominence of the CSF containing spaces compatible generalized cerebral atrophy. Confluent T2/FLAIR hyperintensity involving the periventricular and deep white matter both cerebral hemispheres consistent with chronic microvascular ischemic disease, moderately advanced in nature. Scattered restricted diffusion involving the posterior left frontoparietal region consistent with acute left MCA distribution infarct (series 2, image 31). Involvement is predominantly cortical in nature, although there is some subcortical involvement at the frontal lobe anteriorly. No associated hemorrhage or mass effect. No other evidence for acute or subacute ischemia. Gray-white matter differentiation otherwise maintained. No other areas of chronic infarction. No acute or chronic intracranial hemorrhage. No mass lesion, mass effect, or midline shift. Ventricular prominence related to global parenchymal volume loss without hydrocephalus. No extra-axial fluid collection. Vascular: Hypoplastic right vertebral artery not  visualized, consistent with previously identified occlusion. Major intracranial vascular flow voids otherwise maintained. Skull and upper cervical spine: Craniocervical junction within normal limits. Bone marrow signal intensity normal. No scalp soft tissue abnormality. Sinuses/Orbits: Prior bilateral ocular lens replacement. Paranasal sinuses are largely clear. No significant mastoid effusion. Other: None. IMPRESSION: 1. Acute ischemic nonhemorrhagic left MCA distribution infarct involving the posterior left frontoparietal region. No associated hemorrhage or mass effect. 2. Underlying age-related cerebral atrophy with moderate chronic microvascular ischemic disease. Electronically Signed   By:  Rise Mu M.D.   On: 06/24/2021 21:52   IR CT Head Ltd  Result Date: 06/20/2021 INDICATION: 87 year old male presents for cervical and cerebral angiogram secondary to left M2 occlusion and acute stroke. EXAM: ULTRASOUND-GUIDED ACCESS RIGHT COMMON FEMORAL ARTERY LEFT-SIDED CERVICAL AND CEREBRAL ANGIOGRAM MECHANICAL THROMBECTOMY LEFT M2 ANGIO-SEAL FOR HEMOSTASIS. COMPARISON:  CT imaging same day MEDICATIONS: No intra arterial medications ANESTHESIA/SEDATION: The anesthesia team was present to provide general endotracheal tube anesthesia and for patient monitoring during the procedure. Intubation was performed in biplane/neurointerventional suite. Left radial arterial line was performed by the anesthesia team. Interventional neuro radiology nursing staff was also present. CONTRAST:  80 cc FLUOROSCOPY TIME:  Fluoroscopy Time: 9 minutes 30 seconds (675.8 mGy). COMPLICATIONS: None TECHNIQUE: Informed written consent was obtained from the patient's family after a thorough discussion of the procedural risks, benefits and alternatives. Specific risks discussed include: Bleeding, infection, contrast reaction, kidney injury/failure, need for further procedure/surgery, arterial injury or dissection, embolization to new territory, intracranial hemorrhage (10-15% risk), neurologic deterioration, cardiopulmonary collapse, death. All questions were addressed. Maximal Sterile Barrier Technique was utilized including during the procedure including caps, mask, sterile gowns, sterile gloves, sterile drape, hand hygiene and skin antiseptic. A timeout was performed prior to the initiation of the procedure. The anesthesia team was present to provide general endotracheal tube anesthesia and for patient monitoring during the procedure. Interventional neuro radiology nursing staff was also present. FINDINGS: Initial Findings: Left common carotid artery: Common carotid artery with significant tortuosity after the origin from  the aortic arch. Independent origin from the aortic arch. No significant atherosclerotic changes. Left external carotid artery: Patent with antegrade flow. Left internal carotid artery: Mild tortuosity of the cervical ICA segment. Moderate atherosclerosis at the origin of the ICA with less than 50% stenosis at the origin and minimal irregularity. Vertical and petrous segment patent with normal course caliber contour. Cavernous segment patent. Clinoid segment patent. Antegrade flow of the ophthalmic artery. Ophthalmic segment patent. Terminus patent. Left MCA: M1 segment patent. Insular and opercular segments patent. The inferior division is occluded just beyond the origin, with TICI 0: No perfusion or antegrade flow beyond site of occlusion of the affected division. Fetal PCOM configuration on the left perfusing the posterior circulation. Left ACA: A1 segment is patent. Patent anterior communicating artery with some cross-filling into the right-sided territory. Completion Findings: Left MCA: TICI 3: Complete perfusion of the territory after restoring flow through the affected inferior M2 division Flat panel CT performed demonstrates no intracranial hemorrhage. PROCEDURE: The anesthesia team was present to provide general endotracheal tube anesthesia and for patient monitoring during the procedure. Intubation was performed in negative pressure [place] in neuro IR holding. Interventional neuro radiology nursing staff was also present. Ultrasound survey of the right inguinal region was performed with images stored and sent to PACs, confirming patency of the right common femoral artery. 11 blade scalpel was used to make a small incision. Blunt dissection was performed with US guidance. A micropuncture needle was  used access the right common femoral artery under ultrasound. With excellent arterial blood flow returned, an .018 micro wire was passed through the needle, observed to enter the abdominal aorta under fluoroscopy.  The needle was removed, and a micropuncture sheath was placed over the wire. The inner dilator and wire were removed, and an 035 wire was advanced under fluoroscopy into the abdominal aorta. Five [place] standard sheath was placed. Standard 5 French cavus catheter was advanced over the J wire. Catheter was advanced to the proximal descending thoracic aorta and the wire was removed. Double flush was performed. Davis catheter was then used to select the left common carotid artery. A standard Glidewire was then advanced to the Williams catheter into the cervical ICA. Davis catheter was then advanced on the Glidewire into the distal cervical ICA. Glidewire was removed and contrast injected confirmed a luminal position. Exchange length roadrunner wire was then placed through the [place] catheter in the [place] catheter was removed. The 5 French sheath was removed and a 25cm 53F straight vascular sheath was placed. The dilator was removed and the sheath was flushed. Sheath was attached to pressurized and heparinized saline bag for constant forward flow. A coaxial system was then advanced over the roadrunner 035 wire. This included a 95cm 087 "[place]" balloon guide with coaxial 125cm Berenstein diagnostic catheter. This was advanced to the distal cervical ICA segment. Wire and catheter were then removed. Double flush of the catheter was performed. Angiogram was performed. Road map function was used once the occluded vessel was identified. Copious back flush was performed and the balloon catheter was attached to heparinized and pressurized saline bag for forward flow. We elected to use a direct first aspiration attempt for the first pass. A zoom 35 aspiration catheter was advanced with a coaxial synchro soft wire through the balloon guide. The aspiration catheter was advanced into the inferior division up to the site of occlusion. When the catheter was just proximal to the site of occlusion, wire was removed and the aspiration  catheter was attached to the proprietary aspiration vacuum system. This was turned on, aspiration of blood was confirmed, and then the catheter was advanced to the site of occlusion until resistance was encountered and cessation of blood flow was observed in the aspiration tubing. We observed approximally 2 minutes of aspiration in this position. The balloon at the balloon guide catheter was then inflated under fluoroscopy for proximal flow arrest. Constant aspiration using the proprietary engine was then performed at the aspiration catheter, as the zoom 35 catheter was gently and slowly withdrawn with fluoroscopic observation. Once the catheter was removed from the balloon guide, free aspiration was confirmed at the hub of the balloon guide catheter, with free blood return confirmed. The balloon was then deflated, and a control angiogram was performed. Restoration of flow was confirmed. Angiogram of the cervical ICA was performed. Balloon guide was then completely removed. The skin at the puncture site was then cleaned with Chlorhexidine. The 8 French sheath was removed and an 53F angioseal was deployed. Flat panel CT was performed. Patient was extubated once the CT was reviewed. Patient tolerated the procedure well and remained hemodynamically stable throughout. No complications were encountered and no significant blood loss encountered. IMPRESSION: Status post ultrasound guided access of right common femoral artery for left-sided cervical/cerebral angiogram and mechanical thrombectomy of left M2 occlusion using direct aspiration technique with 1 pass and achieving TICI 3 flow. Angio-Seal for hemostasis. Signed, Yvone Neu. Loreta Ave DO, RPVI [facility] PLAN: ICU status Target systolic blood pressure of 120-140 Right hip straight time 4 hours Frequent neurovascular checks Repeat neurologic imaging with CT and/MRI at the discretion of neurology team  Electronically Signed   By: Gilmer Mor D.O.   On: 06/20/2021 08:19   IR US Guide Vasc Access Right  Result Date: 06/20/2021 INDICATION: 87 year old male presents for cervical and cerebral angiogram secondary to left M2 occlusion and acute stroke. EXAM: ULTRASOUND-GUIDED ACCESS RIGHT COMMON FEMORAL ARTERY LEFT-SIDED CERVICAL AND CEREBRAL ANGIOGRAM MECHANICAL THROMBECTOMY LEFT M2 ANGIO-SEAL FOR HEMOSTASIS. COMPARISON:  CT imaging same day MEDICATIONS: No intra arterial medications ANESTHESIA/SEDATION: The anesthesia team was present to provide general endotracheal tube anesthesia and for patient monitoring during the procedure. Intubation was performed in biplane/neurointerventional suite. Left radial arterial line was performed by the anesthesia team. Interventional neuro radiology nursing staff was also present. CONTRAST:  80 cc FLUOROSCOPY TIME:  Fluoroscopy Time: 9 minutes 30 seconds (675.8 mGy). COMPLICATIONS: None TECHNIQUE: Informed written consent was obtained from the patient's family after a thorough discussion of the procedural risks, benefits and alternatives. Specific risks discussed include: Bleeding, infection, contrast reaction, kidney injury/failure, need for further procedure/surgery, arterial injury or dissection, embolization to new territory, intracranial hemorrhage (10-15% risk), neurologic deterioration, cardiopulmonary collapse, death. All questions were addressed. Maximal Sterile Barrier Technique was utilized including during the procedure including caps, mask, sterile gowns, sterile gloves, sterile drape, hand hygiene and skin antiseptic. A timeout was performed prior to the initiation of the procedure. The anesthesia team was present to provide general endotracheal tube anesthesia and for patient monitoring during the procedure. Interventional neuro radiology nursing staff was also present. FINDINGS: Initial Findings: Left common carotid artery: Common carotid artery with significant  tortuosity after the origin from the aortic arch. Independent origin from the aortic arch. No significant atherosclerotic changes. Left external carotid artery: Patent with antegrade flow. Left internal carotid artery: Mild tortuosity of the cervical ICA segment. Moderate atherosclerosis at the origin of the ICA with less than 50% stenosis at the origin and minimal irregularity. Vertical and petrous segment patent with normal course caliber contour. Cavernous segment patent. Clinoid segment patent. Antegrade flow of the ophthalmic artery. Ophthalmic segment patent. Terminus patent. Left MCA: M1 segment patent. Insular and opercular segments patent. The inferior division is occluded just beyond the origin, with TICI 0: No perfusion or antegrade flow beyond site of occlusion of the affected division. Fetal PCOM configuration on the left perfusing the posterior circulation. Left ACA: A1 segment is patent. Patent anterior communicating artery with some cross-filling into the right-sided territory. Completion Findings: Left MCA: TICI 3: Complete perfusion of the territory after restoring flow through the affected inferior M2 division Flat panel CT performed demonstrates no intracranial hemorrhage. PROCEDURE: The anesthesia team was present to provide general endotracheal tube anesthesia and for patient monitoring during the procedure. Intubation was performed in negative pressure [place] in neuro IR holding. Interventional neuro radiology nursing staff was also present. Ultrasound survey of the right inguinal region was performed with images stored and sent to PACs, confirming patency of the right common femoral artery. 11 blade scalpel was used to make a small incision. Blunt dissection was performed with US guidance. A micropuncture needle was used access the right common femoral artery under ultrasound. With excellent arterial blood flow returned, an .018 micro wire was passed through the needle, observed to enter the  abdominal aorta under fluoroscopy. The needle was removed, and a micropuncture sheath was placed over the wire.  The inner dilator and wire were removed, and an 035 wire was advanced under fluoroscopy into the abdominal aorta. Five [place] standard sheath was placed. Standard 5 French cavus catheter was advanced over the J wire. Catheter was advanced to the proximal descending thoracic aorta and the wire was removed. Double flush was performed. Davis catheter was then used to select the left common carotid artery. A standard Glidewire was then advanced to the [place] catheter into the cervical ICA. Davis catheter was then advanced on the Glidewire into the distal cervical ICA. Glidewire was removed and contrast injected confirmed a luminal position. Exchange length roadrunner wire was then placed through the [place] catheter in the [place] catheter was removed. The 5 French sheath was removed and a 25cm 62F straight vascular sheath was placed. The dilator was removed and the sheath was flushed. Sheath was attached to pressurized and heparinized saline bag for constant forward flow. A coaxial system was then advanced over the roadrunner 035 wire. This included a 95cm 087 "[place]" balloon guide with coaxial 125cm Berenstein diagnostic catheter. This was advanced to the distal cervical ICA segment. Wire and catheter were then removed. Double flush of the catheter was performed. Angiogram was performed. Road map function was used once the occluded vessel was identified. Copious back flush was performed and the balloon catheter was attached to heparinized and pressurized saline bag for forward flow. We elected to use a direct first aspiration attempt for the first pass. A zoom 35 aspiration catheter was advanced with a coaxial synchro soft wire through the balloon guide. The aspiration catheter was advanced into the inferior division up to the site of occlusion. When the catheter was just proximal to the site of occlusion, wire  was removed and the aspiration catheter was attached to the proprietary aspiration vacuum system. This was turned on, aspiration of blood was confirmed, and then the catheter was advanced to the site of occlusion until resistance was encountered and cessation of blood flow was observed in the aspiration tubing. We observed approximally 2 minutes of aspiration in this position. The balloon at the balloon guide catheter was then inflated under fluoroscopy for proximal flow arrest. Constant aspiration using the proprietary engine was then performed at the aspiration catheter, as the zoom 35 catheter was gently and slowly withdrawn with fluoroscopic observation. Once the catheter was removed from the balloon guide, free aspiration was confirmed at the hub of the balloon guide catheter, with free blood return confirmed. The balloon was then deflated, and a control angiogram was performed. Restoration of flow was confirmed. Angiogram of the cervical ICA was performed. Balloon guide was then completely removed. The skin at the puncture site was then cleaned with Chlorhexidine. The 8 French sheath was removed and an 62F angioseal was deployed. Flat panel CT was performed. Patient was extubated once the CT was reviewed. Patient tolerated the procedure well and remained hemodynamically stable throughout. No complications were encountered and no significant blood loss encountered. IMPRESSION: Status post ultrasound guided access of right common femoral artery for left-sided cervical/cerebral angiogram and mechanical thrombectomy of left M2 occlusion using direct aspiration technique with 1 pass and achieving TICI 3 flow. Angio-Seal for hemostasis. Signed, Yvone Neu. Reyne Dumas, RPVI [facility] PLAN: ICU status Target systolic blood pressure of 120-140 Right hip straight time 4 hours Frequent neurovascular checks Repeat neurologic imaging with CT and/MRI at the discretion  of neurology team Electronically Signed   By: Gilmer Mor D.O.  On: 06/20/2021 08:19   DG Swallowing Func-Speech Pathology  Result Date: 06/20/2021 Table formatting from the original result was not included. Images from the original result were not included. Objective Swallowing Evaluation: Type of Study: MBS-Modified Barium Swallow Study  Patient Details Name: Holt Woolbright. MRN: [id number] Date of Birth: 12/01/1926 Today's Date: 06/20/2021 Time: SLP Start Time (ACUTE ONLY): 1224 -SLP Stop Time (ACUTE ONLY): 1233 SLP Time Calculation (min) (ACUTE ONLY): 9 min Past Medical History: Past Medical History: Diagnosis Date  Complete tear of left rotator cuff 07/21/2014  Essential hypertension 04/19/2014  Impingement syndrome of left shoulder 07/21/2014  Left hip pain 02/08/2016  Left supraspinatus tenosynovitis 07/21/2014  Pure hypercholesterolemia 04/19/2014 Past Surgical History: Past Surgical History: Procedure Laterality Date  ELBOW SURGERY  1995  ESOPHAGOGASTRODUODENOSCOPY N/A 05/01/2020  Procedure: ESOPHAGOGASTRODUODENOSCOPY (EGD);  Surgeon: Toledo, Boykin Nearing, MD;  Location: [facility] ENDOSCOPY;  Service: Gastroenterology;  Laterality: N/A;  ESOPHAGOGASTRODUODENOSCOPY (EGD) WITH PROPOFOL N/A 05/31/2016  Procedure: ESOPHAGOGASTRODUODENOSCOPY (EGD) WITH PROPOFOL;  Surgeon: Wyline Mood, MD;  Location: [facility] ENDOSCOPY;  Service: Endoscopy;  Laterality: N/A;  IR CT HEAD LTD  06/19/2021  IR PERCUTANEOUS ART THROMBECTOMY/INFUSION INTRACRANIAL INC DIAG ANGIO  06/19/2021  IR US GUIDE VASC ACCESS RIGHT  06/19/2021  KYPHOPLASTY N/A 07/30/2019  Procedure: L1 KYPHOPLASTY;  Surgeon: Kennedy Bucker, MD;  Location: [facility] ORS;  Service: Orthopedics;  Laterality: N/A;  RADIOLOGY WITH ANESTHESIA N/A 06/19/2021  Procedure: IR WITH ANESTHESIA;  Surgeon: Radiologist, Medication, MD;  Location: [facility] OR;  Service: Radiology;  Laterality: N/A; HPI: 87 y/o M who has a PMH as outlined below including but not limited to HTN, HLD, AF not on AC.  He presented  to [facility] 12/5 with aphasia and right sided weakness. CTA showed distal left M2 occlusion.  He was brought to [facility] for NIR mechanical thrombectomy.  He did not receive tPA as he was deemed to be outside of the window. Pt has PMHX with Complete tear of left rotator cuff; Essential hypertension; Impingement syndrome of left shoulder; Left hip pain; Left supraspinatus tenosynovitis; Pure hypercholesterolemia; Oral phase dysphagia; Esophageal dysphagia; Esophageal mass; AKI (acute kidney injury) (HCC); Hypernatremia; Cellulitis of left leg; Gastroesophageal reflux disease without esophagitis; Esophageal ulcer; Hypokalemia; AF (paroxysmal atrial fibrillation) (HCC); Cerebrovascular accident (CVA) (HCC); and Acute ischemic left MCA stroke (HCC).  Pt had MBSS in Dec 2017 showing functional oropharyngal swallowing  Subjective: Pt awake, alert, aphasic, confused  Recommendations for follow up therapy are one component of a multi-disciplinary discharge planning process, led by the attending physician.  Recommendations may be updated based on patient status, additional functional criteria and insurance authorization. Assessment / Plan / Recommendation Clinical Impressions 06/20/2021 Clinical Impression Pt presents with functional oropharyngeal swallow.  There was no penetration or aspiration noted with any consistencies.  Pt noted to clear throat in absence of penetration/aspiration during this evaluation.  During pill simulation pt masticated tablet.  Recommend crushing medicines with puree.  There is a protrusion in the cervical esophagus below the UES which appears to be soft tissue like.  See picture below.  This abnormality does not affect swallow function.  Bolus flow is not impeded and there was no retention of contrast.  Pt has hx of esophageal dysphagia, esophageal ulcer, esophageal mass, and GERD without esophagitis. Consider GI referral for evaluation of esophagus.  Pt has no further ST.  SLP to follow for  speech-language evaluation.  SLP Visit Diagnosis Dysphagia, unspecified (R13.10) Attention and concentration deficit following -- Frontal lobe and executive function deficit following --  Impact on safety and function No limitations   Treatment Recommendations 06/20/2021 Treatment Recommendations No treatment recommended at this time   Prognosis 06/20/2021 Prognosis for Safe Diet Advancement (No Data) Barriers to Reach Goals -- Barriers/Prognosis Comment -- Diet Recommendations 06/20/2021 SLP Diet Recommendations Regular solids;Thin liquid Liquid Administration via -- Medication Administration Crushed with puree Compensations Slow rate;Small sips/bites;Minimize environmental distractions Postural Changes Seated upright at 90 degrees   Other Recommendations 06/20/2021 Recommended Consults Consider GI evaluation Oral Care Recommendations Oral care BID Other Recommendations -- Follow Up Recommendations No SLP follow up Assistance recommended at discharge Frequent or constant Supervision/Assistance Functional Status Assessment Patient has not had a recent decline in their functional status Frequency and Duration  06/20/2021 Speech Therapy Frequency (ACUTE ONLY) (No Data) Treatment Duration --   Oral Phase 06/20/2021 Oral Phase Impaired Oral - Pudding Teaspoon -- Oral - Pudding Cup -- Oral - Honey Teaspoon -- Oral - Honey Cup -- Oral - Nectar Teaspoon -- Oral - Nectar Cup -- Oral - Nectar Straw -- Oral - Thin Teaspoon -- Oral - Thin Cup WFL Oral - Thin Straw WFL Oral - Puree WFL Oral - Mech Soft -- Oral - Regular WFL Oral - Multi-Consistency -- Oral - Pill -- Oral Phase - Comment --  Pharyngeal Phase 06/20/2021 Pharyngeal Phase -- Pharyngeal- Pudding Teaspoon -- Pharyngeal -- Pharyngeal- Pudding Cup -- Pharyngeal -- Pharyngeal- Honey Teaspoon -- Pharyngeal -- Pharyngeal- Honey Cup -- Pharyngeal -- Pharyngeal- Nectar Teaspoon -- Pharyngeal -- Pharyngeal- Nectar Cup -- Pharyngeal -- Pharyngeal- Nectar Straw -- Pharyngeal --  Pharyngeal- Thin Teaspoon -- Pharyngeal -- Pharyngeal- Thin Cup -- Pharyngeal -- Pharyngeal- Thin Straw -- Pharyngeal -- Pharyngeal- Puree -- Pharyngeal -- Pharyngeal- Mechanical Soft -- Pharyngeal -- Pharyngeal- Regular Delayed swallow initiation-pyriform sinuses Pharyngeal -- Pharyngeal- Multi-consistency -- Pharyngeal -- Pharyngeal- Pill -- Pharyngeal -- Pharyngeal Comment --  Cervical Esophageal Phase  06/20/2021 Cervical Esophageal Phase Impaired Pudding Teaspoon -- Pudding Cup -- Honey Teaspoon -- Honey Cup -- Nectar Teaspoon -- Nectar Cup -- Nectar Straw -- Thin Teaspoon -- Thin Cup -- Thin Straw (No Data) Puree -- Mechanical Soft -- Regular -- Multi-consistency -- Pill -- Cervical Esophageal Comment -- Kerrie Pleasure , MA, CCC-SLP Acute Rehabilitation Services Office: [phone] 06/20/2021, 1:14 PM                     EEG adult  Result Date: 06/22/2021 Charlsie Quest, MD     06/22/2021 12:50 PM Patient Name: Devon Elliott. MRN: [id number] Epilepsy Attending: Charlsie Quest Referring Physician/Provider: Marjorie Smolder, NP Date: 06/22/2021 Duration: 23.08 mins Patient history: 87 year old male with left M2 occlusion status post thrombectomy now with altered mental status.  EEG to evaluate for seizure. Level of alertness: Awake AEDs during EEG study: None Technical aspects: This EEG study was done with scalp electrodes positioned according to the 10-20 International system of electrode placement. Electrical activity was acquired at a sampling rate of 500Hz  and reviewed with a high frequency filter of 70Hz  and a low frequency filter of 1Hz . EEG data were recorded continuously and digitally stored. Description: The posterior dominant rhythm consists of 8 Hz activity of moderate voltage (25-35 uV) seen predominantly in posterior head regions, symmetric and reactive to eye opening and eye closing.  EEG showed intermittent generalized and lateralized right hemisphere 3 to 6 Hz theta-delta  slowing. Hyperventilation and photic stimulation were not performed.   ABNORMALITY - Intermittent slow, generalized and lateralized right hemisphere IMPRESSION:  This study is suggestive of cortical dysfunction in right hemisphere as well as mild diffuse encephalopathy, nonspecific etiology.  No seizures or epileptiform discharges were seen throughout the recording. Charlsie Quest   ECHOCARDIOGRAM COMPLETE  Result Date: 06/20/2021    ECHOCARDIOGRAM REPORT   Patient Name:   Kiel Cockerell. Date of Exam: 06/20/2021 Medical Rec #:  [id number]             Height:       69.0 in Accession #:    [id number]            Weight:       148.1 lb Date of Birth:  [birth year]              BSA:          1.819 m Patient Age:    87 years              BP:           152/85 mmHg Patient Gender: M                     HR:           94 bpm. Exam Location:  Inpatient Procedure: 2D Echo, Cardiac Doppler and Color Doppler Indications:    TIA  History:        Patient has no prior history of Echocardiogram examinations.  Sonographer:    Roosvelt Maser RDCS Referring Phys: [id number] [facility] IMPRESSIONS  1. Left ventricular ejection fraction, by estimation, is 45%. The left ventricle has mildly decreased function. The left ventricle demonstrates global hypokinesis. Left ventricular diastolic parameters are indeterminate.  2. Right ventricular systolic function is normal. The right ventricular size is mildly enlarged. There is mildly elevated pulmonary artery systolic pressure. The estimated right ventricular systolic pressure is 38.0 mmHg.  3. Left atrial size was severely dilated.  4. Right atrial size was moderately dilated.  5. The mitral valve is grossly normal. No evidence of mitral valve regurgitation. No evidence of mitral stenosis.  6. The aortic valve was not well visualized. There is moderate calcification of the aortic valve. Aortic valve regurgitation is mild. Aortic valve sclerosis/calcification is present, without any  evidence of aortic stenosis.  7. The inferior vena cava is normal in size with <50% respiratory variability, suggesting right atrial pressure of 8 mmHg. Comparison(s): No prior Echocardiogram. FINDINGS  Left Ventricle: Left ventricular ejection fraction, by estimation, is 45%. The left ventricle has mildly decreased function. The left ventricle demonstrates global hypokinesis. The left ventricular internal cavity size was normal in size. There is no left ventricular hypertrophy. Left ventricular diastolic parameters are indeterminate. Right Ventricle: The right ventricular size is mildly enlarged. No increase in right ventricular wall thickness. Right ventricular systolic function is normal. There is mildly elevated pulmonary artery systolic pressure. The tricuspid regurgitant velocity is 2.74 m/s, and with an assumed right atrial pressure of 8 mmHg, the estimated right ventricular systolic pressure is 38.0 mmHg. Left Atrium: Left atrial size was severely dilated. Right Atrium: Right atrial size was moderately dilated. Pericardium: There is no evidence of pericardial effusion. Mitral Valve: The mitral valve is grossly normal. Mild mitral annular calcification. No evidence of mitral valve regurgitation. No evidence of mitral valve stenosis. Tricuspid Valve: The tricuspid valve is normal in structure. Tricuspid valve regurgitation is mild. Aortic Valve: The aortic valve was not well visualized. There is moderate calcification of the aortic valve. Aortic valve regurgitation is mild.  Aortic valve sclerosis/calcification is present, without any evidence of aortic stenosis. Aortic valve mean gradient measures 10.0 mmHg. Aortic valve peak gradient measures 17.6 mmHg. Aortic valve area, by VTI measures 1.24 cm. Pulmonic Valve: The pulmonic valve was normal in structure. Pulmonic valve regurgitation is not visualized. No evidence of pulmonic stenosis. Aorta: The aortic root and ascending aorta are structurally normal, with  no evidence of dilitation. Venous: The inferior vena cava is normal in size with less than 50% respiratory variability, suggesting right atrial pressure of 8 mmHg. IAS/Shunts: The atrial septum is grossly normal.  LEFT VENTRICLE PLAX 2D LVIDd:         5.00 cm LVIDs:         3.60 cm LV PW:         0.90 cm LV IVS:        0.80 cm LVOT diam:     2.10 cm LV SV:         44 LV SV Index:   24 LVOT Area:     3.46 cm  RIGHT VENTRICLE          IVC RV Basal diam:  4.40 cm  IVC diam: 2.00 cm RV Mid diam:    4.80 cm LEFT ATRIUM              Index        RIGHT ATRIUM           Index LA diam:        4.20 cm  2.31 cm/m   RA Area:     24.40 cm LA Vol (A2C):   121.0 ml 66.53 ml/m  RA Volume:   83.80 ml  46.08 ml/m LA Vol (A4C):   91.5 ml  50.31 ml/m LA Biplane Vol: 110.0 ml 60.49 ml/m  AORTIC VALVE AV Area (Vmax):    1.36 cm AV Area (Vmean):   1.23 cm AV Area (VTI):     1.24 cm AV Vmax:           210.00 cm/s AV Vmean:          147.000 cm/s AV VTI:            0.355 m AV Peak Grad:      17.6 mmHg AV Mean Grad:      10.0 mmHg LVOT Vmax:         82.70 cm/s LVOT Vmean:        52.300 cm/s LVOT VTI:          0.127 m LVOT/AV VTI ratio: 0.36  AORTA Ao Root diam: 3.40 cm Ao Asc diam:  3.40 cm TRICUSPID VALVE TR Peak grad:   30.0 mmHg TR Vmax:        274.00 cm/s  SHUNTS Systemic VTI:  0.13 m Systemic Diam: 2.10 cm Riley Lam MD Electronically signed by Riley Lam MD Signature Date/Time: 06/20/2021/5:02:23 PM    Final    IR PERCUTANEOUS ART THROMBECTOMY/INFUSION INTRACRANIAL INC DIAG ANGIO  Result Date: 06/20/2021 INDICATION: 87 year old male presents for cervical and cerebral angiogram secondary to left M2 occlusion and acute stroke. EXAM: ULTRASOUND-GUIDED ACCESS RIGHT COMMON FEMORAL ARTERY LEFT-SIDED CERVICAL AND CEREBRAL ANGIOGRAM MECHANICAL THROMBECTOMY LEFT M2 ANGIO-SEAL FOR HEMOSTASIS. COMPARISON:  CT imaging same day MEDICATIONS: No intra arterial medications ANESTHESIA/SEDATION: The anesthesia team was  present to provide general endotracheal tube anesthesia and for patient monitoring during the procedure. Intubation was performed in biplane/neurointerventional suite. Left radial arterial line was performed by the anesthesia team. Interventional neuro radiology  nursing staff was also present. CONTRAST:  80 cc FLUOROSCOPY TIME:  Fluoroscopy Time: 9 minutes 30 seconds (675.8 mGy). COMPLICATIONS: None TECHNIQUE: Informed written consent was obtained from the patient's family after a thorough discussion of the procedural risks, benefits and alternatives. Specific risks discussed include: Bleeding, infection, contrast reaction, kidney injury/failure, need for further procedure/surgery, arterial injury or dissection, embolization to new territory, intracranial hemorrhage (10-15% risk), neurologic deterioration, cardiopulmonary collapse, death. All questions were addressed. Maximal Sterile Barrier Technique was utilized including during the procedure including caps, mask, sterile gowns, sterile gloves, sterile drape, hand hygiene and skin antiseptic. A timeout was performed prior to the initiation of the procedure. The anesthesia team was present to provide general endotracheal tube anesthesia and for patient monitoring during the procedure. Interventional neuro radiology nursing staff was also present. FINDINGS: Initial Findings: Left common carotid artery: Common carotid artery with significant tortuosity after the origin from the aortic arch. Independent origin from the aortic arch. No significant atherosclerotic changes. Left external carotid artery: Patent with antegrade flow. Left internal carotid artery: Mild tortuosity of the cervical ICA segment. Moderate atherosclerosis at the origin of the ICA with less than 50% stenosis at the origin and minimal irregularity. Vertical and petrous segment patent with normal course caliber contour. Cavernous segment patent. Clinoid segment patent. Antegrade flow of the  ophthalmic artery. Ophthalmic segment patent. Terminus patent. Left MCA: M1 segment patent. Insular and opercular segments patent. The inferior division is occluded just beyond the origin, with TICI 0: No perfusion or antegrade flow beyond site of occlusion of the affected division. Fetal PCOM configuration on the left perfusing the posterior circulation. Left ACA: A1 segment is patent. Patent anterior communicating artery with some cross-filling into the right-sided territory. Completion Findings: Left MCA: TICI 3: Complete perfusion of the territory after restoring flow through the affected inferior M2 division Flat panel CT performed demonstrates no intracranial hemorrhage. PROCEDURE: The anesthesia team was present to provide general endotracheal tube anesthesia and for patient monitoring during the procedure. Intubation was performed in negative pressure [place] in neuro IR holding. Interventional neuro radiology nursing staff was also present. Ultrasound survey of the right inguinal region was performed with images stored and sent to PACs, confirming patency of the right common femoral artery. 11 blade scalpel was used to make a small incision. Blunt dissection was performed with US guidance. A micropuncture needle was used access the right common femoral artery under ultrasound. With excellent arterial blood flow returned, an .018 micro wire was passed through the needle, observed to enter the abdominal aorta under fluoroscopy. The needle was removed, and a micropuncture sheath was placed over the wire. The inner dilator and wire were removed, and an 035 wire was advanced under fluoroscopy into the abdominal aorta. Five [place] standard sheath was placed. Standard 5 French cavus catheter was advanced over the J wire. Catheter was advanced to the proximal descending thoracic aorta and the wire was removed. Double flush was performed. Davis catheter was then used to select the left common carotid artery. A standard  Glidewire was then advanced to the [place] catheter into the cervical ICA. Davis catheter was then advanced on the Glidewire into the distal cervical ICA. Glidewire was removed and contrast injected confirmed a luminal position. Exchange length roadrunner wire was then placed through the [place] catheter in the [place] catheter was removed. The 5 French sheath was removed and a 25cm 14F straight vascular sheath was placed. The dilator was removed and the sheath was flushed. Sheath was  attached to pressurized and heparinized saline bag for constant forward flow. A coaxial system was then advanced over the roadrunner 035 wire. This included a 95cm 087 "Walrus" balloon guide with coaxial 125cm Berenstein diagnostic catheter. This was advanced to the distal cervical ICA segment. Wire and catheter were then removed. Double flush of the catheter was performed. Angiogram was performed. Road map function was used once the occluded vessel was identified. Copious back flush was performed and the balloon catheter was attached to heparinized and pressurized saline bag for forward flow. We elected to use a direct first aspiration attempt for the first pass. A zoom 35 aspiration catheter was advanced with a coaxial synchro soft wire through the balloon guide. The aspiration catheter was advanced into the inferior division up to the site of occlusion. When the catheter was just proximal to the site of occlusion, wire was removed and the aspiration catheter was attached to the proprietary aspiration vacuum system. This was turned on, aspiration of blood was confirmed, and then the catheter was advanced to the site of occlusion until resistance was encountered and cessation of blood flow was observed in the aspiration tubing. We observed approximally 2 minutes of aspiration in this position. The balloon at the balloon guide catheter was then inflated under fluoroscopy for proximal flow arrest. Constant aspiration using the proprietary  engine was then performed at the aspiration catheter, as the zoom 35 catheter was gently and slowly withdrawn with fluoroscopic observation. Once the catheter was removed from the balloon guide, free aspiration was confirmed at the hub of the balloon guide catheter, with free blood return confirmed. The balloon was then deflated, and a control angiogram was performed. Restoration of flow was confirmed. Angiogram of the cervical ICA was performed. Balloon guide was then completely removed. The skin at the puncture site was then cleaned with Chlorhexidine. The 8 French sheath was removed and an 70F angioseal was deployed. Flat panel CT was performed. Patient was extubated once the CT was reviewed. Patient tolerated the procedure well and remained hemodynamically stable throughout. No complications were encountered and no significant blood loss encountered. IMPRESSION: Status post ultrasound guided access of right common femoral artery for left-sided cervical/cerebral angiogram and mechanical thrombectomy of left M2 occlusion using direct aspiration technique with 1 pass and achieving TICI 3 flow. Angio-Seal for hemostasis. Signed, Yvone Neu. Reyne Dumas, [facility] Radiology PLAN: ICU status Target systolic blood pressure of 120-140 Right hip straight time 4 hours Frequent neurovascular checks Repeat neurologic imaging with CT and/MRI at the discretion of neurology team Electronically Signed   By: Gilmer Mor D.O.   On: 06/20/2021 08:19   CT HEAD CODE STROKE WO CONTRAST  Result Date: 06/19/2021 CLINICAL DATA:  Code stroke. EXAM: CT HEAD WITHOUT CONTRAST TECHNIQUE: Contiguous axial images were obtained from the base of the skull through the vertex without intravenous contrast. COMPARISON:  December 2020 FINDINGS: Brain: There is no acute intracranial hemorrhage, mass effect, or edema. No new loss of gray-white differentiation. Confluent areas of hypodensity in  the supratentorial white matter are nonspecific but may reflect advanced chronic microvascular ischemic changes. Prominence of the ventricles and sulci reflects parenchymal volume loss. These changes have progressed since 2020. No extra-axial collection. Vascular: No hyperdense vessel. There is intracranial atherosclerotic calcification at the skull base. Skull: Unremarkable. Sinuses/Orbits: No acute finding. Other: Mastoid air cells are clear. ASPECTS [facility] Stroke Program Early CT Score) - Ganglionic level infarction (caudate, lentiform nuclei, internal capsule, insula, M1-M3  cortex): 7 - Supraganglionic infarction (M4-M6 cortex): 3 Total score (0-10 with 10 being normal): 10 IMPRESSION: There is no acute intracranial hemorrhage or evidence of acute infarction. ASPECT score is 10. Parenchymal volume loss and advanced chronic microvascular ischemic changes with progression since 2020. Preliminary results were communicated to Dr. Selina Cooley at 12:24 pm on 06/19/2021 by text page via the [facility] messaging system. Electronically Signed   By: Guadlupe Spanish M.D.   On: 06/19/2021 12:29   CT ANGIO HEAD NECK W WO CM W PERF (CODE STROKE)  Result Date: 06/19/2021 CLINICAL DATA:  Neuro deficit, acute, stroke suspected EXAM: CT ANGIOGRAPHY HEAD AND NECK CT PERFUSION BRAIN TECHNIQUE: Multidetector CT imaging of the head and neck was performed using the standard protocol during bolus administration of intravenous contrast. Multiplanar CT image reconstructions and MIPs were obtained to evaluate the vascular anatomy. Carotid stenosis measurements (when applicable) are obtained utilizing NASCET criteria, using the distal internal carotid diameter as the denominator. Multiphase CT imaging of the brain was performed following IV bolus contrast injection. Subsequent parametric perfusion maps were calculated using RAPID software. CONTRAST:  OMNIPAQUE IOHEXOL 350 MG/ML SOLN COMPARISON:  None. FINDINGS: CTA NECK Aortic arch: Mild  mixed plaque. Great vessel origins are patent without high-grade stenosis. Right carotid system: Patent. Eccentric noncalcified plaque along the common carotid with minimal stenosis. Mixed but primarily noncalcified plaque at the proximal ICA causing less than 50% stenosis. Left carotid system: Patent. Noncalcified plaque along the common carotid with minimal stenosis. Mixed but primarily calcified plaque along the proximal internal carotid causing less than 50% stenosis. Vertebral arteries: Patent dominant left vertebral artery. Right vertebral artery is diminutive in caliber but patent to the skull base. Skeleton: Advanced degenerative changes of the cervical spine. Other neck: Unremarkable. Upper chest: Emphysema. Review of the MIP images confirms the above findings CTA HEAD Anterior circulation: Intracranial internal carotid arteries are patent with calcified plaque causing mild stenosis. Left M1 MCA is patent. There is occlusion of distal left M2 posterior division within the posterior left sylvian fissure. Right middle and both anterior cerebral arteries are patent. There is focal moderate stenosis of the right M1 MCA. Posterior circulation: Intracranial left vertebral artery is patent with mild calcified plaque. 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 Basilar artery is patent. Major cerebellar artery origins are patent. A left posterior communicating artery is present. Posterior cerebral arteries are patent. There is focal moderate to marked stenosis of the distal left P2 PCA. Venous sinuses: Patent as allowed by contrast bolus timing. Review of the MIP images confirms the above findings CT Brain Perfusion Findings: CBF (<30%) Volume: 0mL Perfusion (Tmax>6.0s) volume: 90mL Mismatch Volume: 90mL Infarction Location: Left MCA territory. There is also some involvement of the right frontal periventricular white matter and temporal lobe which is likely artifactual. IMPRESSION: Distal left  M2 MCA occlusion within the posterior left sylvian fissure. Perfusion imaging demonstrates no evidence of core infarction. 90 mL of penumbra in the left MCA territory is likely mildly overestimated given some artifactual contralateral contribution. Plaque at the ICA origins causes less than 50% stenosis. Diminutive but patent extracranial right vertebral artery probably on a congenital basis. Apparent occlusion intracranially is probably not acute. The dominant left vertebral artery is patent. Moderate stenosis right M1 MCA. Moderate to marked stenosis distal left P2 PCA. Preliminary results were communicated to Dr. Selina Cooley at 12:50 pm on 06/19/2021 by text page via the [employer] messaging system. Electronically Signed   By: Jackquline Berlin.D.  On: 06/19/2021 13:06    Microbiology: Recent Results (from the past 240 hour(s))  Surgical PCR screen     Status: None   Collection Time: 06/22/21  6:56 PM   Specimen: Nasal Mucosa; Nasal Swab  Result Value Ref Range Status   MRSA, PCR NEGATIVE NEGATIVE Final   Staphylococcus aureus NEGATIVE NEGATIVE Final    Comment: (NOTE) The Xpert SA Assay (FDA approved for NASAL specimens in patients 74 years of age and older), is one component of a comprehensive surveillance program. It is not intended to diagnose infection nor to guide or monitor treatment. Performed at [facility], [address] [place], [place] [address]      Labs: Basic Metabolic Panel: Recent Labs  Lab 06/25/21 0254 06/25/21 2330 06/26/21 1311 06/27/21 0842 06/28/21 0318 06/29/21 0456  NA 140  --  141 141 141 142  K 3.3*  --  4.1 3.5 3.5 4.1  CL 109  --  109 112* 111 109  CO2 22  --  20* 21* 24 24  GLUCOSE 97  --  176* 113* 125* 115*  BUN 16  --  27* 26* 25* 26*  CREATININE 0.98  --  1.25* 1.11 1.19 1.29*  CALCIUM 8.2*  --  8.3* 8.0* 7.8* 8.5*  MG  --  1.6*  --   --   --  2.4   Liver Function Tests: No results for input(s): AST, ALT, ALKPHOS, BILITOT, PROT, ALBUMIN in  the last 168 hours. No results for input(s): LIPASE, AMYLASE in the last 168 hours. No results for input(s): AMMONIA in the last 168 hours. CBC: Recent Labs  Lab 06/23/21 0941 06/24/21 0317 06/28/21 0318  WBC 7.4 7.5 10.1  HGB 10.2* 10.3* 11.2*  HCT 31.4* 32.4* 34.2*  MCV 92.1 93.4 91.9  PLT 126* 121* 170   Cardiac Enzymes: No results for input(s): CKTOTAL, CKMB, CKMBINDEX, TROPONINI in the last 168 hours. BNP: BNP (last 3 results) No results for input(s): BNP in the last 8760 hours.  ProBNP (last 3 results) No results for input(s): PROBNP in the last 8760 hours.  CBG: No results for input(s): GLUCAP in the last 168 hours.  Principal Problem:   Acute ischemic left MCA stroke (HCC) Active Problems:   Essential hypertension   AKI (acute kidney injury) (HCC)   Hypokalemia   AF (paroxysmal atrial fibrillation) (HCC)   Abdominal pain, acute, epigastric   Delirium   Acute hypoxemic respiratory failure (HCC)   HFrEF (heart failure with reduced ejection fraction) (HCC)   Hyperlipidemia   Goals of care, counseling/discussion   Time coordinating discharge: 38 minutes  Signed:        Daney Moor, DO [facility]  06/29/2021, 3:51 PM

## 2021-06-29 NOTE — Progress Notes (Signed)
Inpatient Rehabilitation Admission Medication Review by a Pharmacist  A complete drug regimen review was completed for this patient to identify any potential clinically significant medication issues.  High Risk Drug Classes Is patient taking? Indication by Medication  Antipsychotic Yes Compazine n/v Seroquel/depakote agitation  Anticoagulant Yes Apixaban - afib/cva  Antibiotic No   Opioid No   Antiplatelet No   Hypoglycemics/insulin No   Vasoactive Medication Yes Metoprolol - htn  Chemotherapy No   Other Yes Memantine - dementia Atorvastatin - hld Protonix - gerd      Type of Medication Issue Identified Description of Issue Recommendation(s)  Drug Interaction(s) (clinically significant)     Duplicate Therapy     Allergy     No Medication Administration End Date     Incorrect Dose     Additional Drug Therapy Needed  Cholecalciferol 25 MCG (1000 UT) Follow up with Wendi Maya in [place]  Significant med changes from prior encounter (inform family/care partners about these prior to discharge).    Other       Clinically significant medication issues were identified that warrant physician communication and completion of prescribed/recommended actions by midnight of the next day:  Yes  Name of provider notified for urgent issues identified: Wendi Maya, PA  Provider Method of Notification: Secure chat    Pharmacist comments:   Time spent performing this drug regimen review (minutes):  20   Ulyses Southward, PharmD, [place], AAHIVP, CPP Infectious Disease Pharmacist 06/29/2021 9:43 PM

## 2021-06-29 NOTE — Progress Notes (Signed)
**Note Devon-Identified via Obfuscation** Devon Staggers, Devon Elliott  Physician CASE MANAGEMENT PMR Pre-admission     Signed Date of Service:  06/25/2021 12:11 PM  Related encounter: Admission (Current) from 06/19/2021 in [place]                                                                                                                                                                                                                                                                                                                                                                                                                                                                                                                                                               PMR Admission Coordinator Pre-Admission Assessment   Patient: Devon Elliott. is an 87 y.o., male MRN: [id number]  DOB: [birth year] Height: 5' 9"  (175.3 cm) (From 04/2020) Weight: 67.2 kg   Insurance Information HMO: yes    PPO:      PCP:      IPA:      80/20:      OTHER:  PRIMARY: Blue Medicare      Policy#: [id number]      Subscriber: Pt CM Name: Devon Elliott      Phone#: [phone]     Fax#: [phone] Call Devon Elliott for Devon Elliott number, dates, and fax # on day of admission. Pre-Cert#: [id number]      Employer: n/Elliott Benefits:  Phone #: [phone]     Name:  Devon Elliott Date: 07/16/2020 - still active Deductible: does not have one OOP Max: $3,900 ($129.08 met) CIR: $295/day co-pay for days 1-6 SNF: $0.00 Copayment per day for days 1-20; $188 Copayment per day for days 21-60; $0.00 copayment for days 61-100 Maximum of 100 days/benefit period Outpatient:  $40 copay/visit Home Health:  100% coverage DME: 80% coverage, 20% co-insurance    SECONDARY: none      Policy#:      Phone#:    Financial Counselor:       Phone#:    The Actuary for patients in Inpatient Rehabilitation Facilities with attached Privacy Act [place] Records was provided and verbally reviewed with: Patient   Emergency Contact Information Contact Information       Name Relation Home Work Mobile    Devon Elliott [phone]   [place] [phone]        [place] Relative     [phone]           Current Medical History  Patient Admitting Diagnosis: CVA   History of Present Illness: Devon Elliott. is Elliott 87 y.o. male with PMH significant for hypertension, atrial fibrillation on anticoagulation who initially presented to [facility] for aphasia.  Patient was evaluated by Dr. Lorrene Reid with neurology team at [facility]. Per wife, had speech difficulty yesterday afternoon and not feeling went. Woke up this AM and was fine. Dressed himself and sat on the recliner when he had acute onset and severe word finding difficulties with some right-sided weakness.  This happened around 0900 on 06/19/2021.  EMS was called and patient was brought into [facility] where CT head without contrast with aspects of 10.  CT angio head and neck with distal left MCA M2 occlusion. CTP with no core and Elliott 60ms mismatch. [facility] Patient underwent mechanical thrombectomy per IR, after the procedure he became agitated and restless.  He received diphenhydramine and Haldol with Ativan.  Patient became somnolent with mild desaturation into 80s on 3 L of oxygen.  Mental status was depressed, he was minimally responsive to noxious stimuli.  PCCM was subsequently consulted for airway protection; however, Pt. Did not require intubation. Received romazicon for reversal. Pt. Seen by PT and OT who recommend CIR for return to PLOF.    Complete NIHSS TOTAL: 11   [facility]Elliott [facility]has been reviewed by the rehabilitation admission coordinator and physician.   Past Medical History      Past Medical History:  Diagnosis Date   Complete tear of left rotator cuff 07/21/2014   Essential hypertension 04/19/2014   Impingement syndrome of left shoulder 07/21/2014   Left hip pain 02/08/2016   Left supraspinatus tenosynovitis 07/21/2014   Pure hypercholesterolemia 04/19/2014  Has the patient had major surgery during 100 days prior to admission? Yes   Family History   family history includes Diabetes in his father; Pancreatic cancer in his mother.   Current Medications   Current Facility-Administered Medications:    acetaminophen (TYLENOL) tablet 650 mg, 650 mg, Oral, Q4H PRN, 650 mg at 06/24/21 0701 **OR** acetaminophen (TYLENOL) 160 MG/5ML solution 650 mg, 650 mg, Per Tube, Q4H PRN **OR** acetaminophen (TYLENOL) suppository 650 mg, 650 mg, Rectal, Q4H PRN, Devon Simpers, Devon Elliott   albuterol (PROVENTIL) (2.5 MG/3ML) 0.083% nebulizer solution 2.5 mg, 2.5 mg, Nebulization, Q2H PRN, Devon Elliott, Devon Dimmer, NP   apixaban (ELIQUIS) tablet 5 mg, 5 mg, Oral, BID, Devon Elliott, Devon Elliott, Devon Elliott, 5 mg at 06/29/21 0757   atorvastatin (LIPITOR) tablet 40 mg, 40 mg, Oral, Daily, Devon Elliott, Devon E, NP, 40 mg at 06/29/21 9629   Chlorhexidine Gluconate Cloth 2 % PADS 6 each, 6 each, Topical, Daily, Devon Simpers, Devon Elliott, 6 each at 06/29/21 1241   divalproex (DEPAKOTE SPRINKLE) capsule 125 mg, 125 mg, Oral, Q8H, Devon Elliott, Devon A, NP, 125 mg at 06/29/21 0507   haloperidol lactate (HALDOL) injection 2.5 mg, 2.5 mg, Intravenous, Q6H PRN, Devon Elliott, Devon Elliott, Devon Elliott, 2.5 mg at 06/29/21 0758   labetalol (NORMODYNE) injection 10 mg, 10 mg, Intravenous, Q30 min PRN, Devon Elliott, Grace E, NP, 10 mg at 06/22/21 1204   memantine (NAMENDA) tablet 5 mg, 5 mg, Oral, q morning, Devon Elliott, Grace E, NP, 5 mg at 06/29/21 0758   metoprolol tartrate (LOPRESSOR) tablet 25 mg, 25 mg, Oral, BID,  Darrick Meigs, Gagan Elliott, Devon Elliott, 25 mg at 06/29/21 0758   pantoprazole (PROTONIX) EC tablet 40 mg, 40 mg, Oral, Q1200, Darrick Meigs, Gagan Elliott, Devon Elliott, 40 mg at 06/29/21 1243   QUEtiapine (SEROQUEL) tablet 25 mg, 25 mg, Oral, Q6H PRN, Devon Elliott, [name], NP, 25 mg at 06/26/21 0957   QUEtiapine (SEROQUEL) tablet 25 mg, 25 mg, Oral, QHS, Sethi, Devon Elliott, Devon Elliott, 25 mg at 06/28/21 2141   QUEtiapine (SEROQUEL) tablet 25 mg, 25 mg, Oral, Q0600, Garvin Fila, Devon Elliott, 25 mg at 06/29/21 0507   senna-docusate (Senokot-Elliott) tablet 1 tablet, 1 tablet, Oral, QHS PRN, Devon Simpers, Devon Elliott   Patients Current Diet:  Diet Order                  DIET DYS 3 Room service appropriate? Yes with Assist; Fluid consistency: Thin  Diet effective now                         Precautions / Restrictions Precautions Precautions: Fall, Other (comment) Precaution Comments: SBP<200 Restrictions Weight Bearing Restrictions: No    Has the patient had 2 or more falls or Elliott fall with injury in the past year? Yes   Prior Activity Level Community (5-7x/wk): active in the community PTA   Prior Functional Level Self Care: Did the patient need help bathing, dressing, using the toilet or eating? Independent   Indoor Mobility: Did the patient need assistance with walking from room to room (with or without device)? Independent   Stairs: Did the patient need assistance with internal or external stairs (with or without device)? Independent   Functional Cognition: Did the patient need help planning regular tasks such as shopping or remembering to take medications? Needed some help   Patient Information Are you of Hispanic, Latino/Elliott,or Spanish origin?: Elliott. No, not of Hispanic, Latino/Elliott, or Spanish origin What is your race?: Elliott. White Do  you need or want an interpreter to communicate with Elliott doctor or health care staff?: 0. No   Patient'Elliott Response To:  Health Literacy and Transportation Is the patient able to respond to health literacy and  transportation needs?: Yes Health Literacy - How often do you need to have someone help you when you read instructions, pamphlets, or other written material from your doctor or pharmacy?: Often In the past 12 months, has lack of transportation kept you from medical appointments or from getting medications?: No In the past 12 months, has lack of transportation kept you from meetings, work, or from getting things needed for daily living?: No   Home Assistive Devices / Equipment Home Equipment: Conservation officer, nature (2 wheels), Other (comment), Cane - single point, Shower seat   Prior Device Use: Indicate devices/aids used by the patient prior to current illness, exacerbation or injury? Walker   Current Functional Level Cognition   Arousal/Alertness: Awake/alert Overall Cognitive Status: History of cognitive impairments - at baseline Current Attention Level: Focused, Sustained Orientation Level: Oriented X4 Following Commands: Follows one step commands inconsistently, Follows one step commands with increased time Safety/Judgement: Decreased awareness of safety, Decreased awareness of deficits General Comments: baseline confusion with hx of dementia. Following ~75% of commands with encouragement. Attention: Sustained Sustained Attention: Impaired Sustained Attention Impairment: Verbal basic Awareness: Impaired Awareness Impairment: Intellectual impairment    Extremity Assessment (includes Sensation/Coordination)   Upper Extremity Assessment: Generalized weakness RUE Deficits / Details: cuts/skin tears on dorsal portion of hand  Lower Extremity Assessment: Defer to PT evaluation     ADLs   Overall ADL'Elliott : Needs assistance/impaired Grooming: Wash/dry face, Minimal assistance, Sitting Grooming Details (indicate cue type and reason): Max Elliott for standing balance while washing his face at sink. Mod cues for sequencing of washing his face. becoming distracted by wiping sink counter with wash clothe  instead. Upper Body Bathing: Moderate assistance, Sitting Lower Body Bathing: Maximal assistance, Sitting/lateral leans Upper Body Dressing : Moderate assistance, Sitting Lower Body Dressing: Maximal assistance, Sit to/from stand Toilet Transfer: Maximal assistance (simulated at recliner) Toileting- Clothing Manipulation and Hygiene: Maximal assistance, Sitting/lateral lean Functional mobility during ADLs: Moderate assistance, Maximal assistance (sit<>stand) General ADL Comments: Pt with increased lethargy at begining of session. As he became more aroused, pt was able to particiapte more in activities. Pt performing sit<>stand at sink to wash his face.     Mobility   Overal bed mobility: Needs Assistance Bed Mobility: Supine to Sit, Sit to Supine Supine to sit: Min assist, +2 for physical assistance Sit to supine: Mod assist, +2 for physical assistance General bed mobility comments: patient initiating bringing LEs towards EOB and assist for trunk elevation     Transfers   Overall transfer level: Needs assistance Equipment used: 2 person hand held assist Transfers: Sit to/from Stand Sit to Stand: Mod assist, +2 safety/equipment, From elevated surface Bed to/from chair/wheelchair/BSC transfer type:: Step pivot Step pivot transfers: Min assist, +2 safety/equipment General transfer comment: modA+2 to rise and steady. Encouragement required to complete     Ambulation / Gait / Stairs / Wheelchair Mobility   Ambulation/Gait Ambulation/Gait assistance: Mod assist, +2 safety/equipment Gait Distance (Feet): 15 Feet Assistive device: 2 person hand held assist Gait Pattern/deviations: Step-to pattern, Decreased stride length, Wide base of support, Shuffle General Gait Details: shuffling gait pattern with wide BOS. HHAx2 for safety and modA+2 for balance throughout. Max encouragement required to perform short mobility. Patient seems to have fear of falling Gait velocity: decreased Gait velocity  interpretation: <1.31 ft/sec, indicative of household ambulator     Posture / Balance Dynamic Sitting Balance Sitting balance - Comments: Practicing bending forward with supervision. no LOB while picking item off the floor while seated Balance Overall balance assessment: Needs assistance Sitting-balance support: Feet supported, No upper extremity supported Sitting balance-Leahy Scale: Fair Sitting balance - Comments: Practicing bending forward with supervision. no LOB while picking item off the floor while seated Postural control: Posterior lean Standing balance support: Bilateral upper extremity supported, No upper extremity supported, During functional activity Standing balance-Leahy Scale: Poor Standing balance comment: reliant on physical Elliott and/or UE support     Special needs/care consideration Skin surgical incision, R hand abrasion,  and Behavioral consideration Pt. With mild dementia at baseline, increased confusion this admission.     Previous Home Environment (from acute therapy documentation) Living Arrangements: Spouse/significant other  Lives With: Spouse Available Help at Discharge: Available 24 hours/day Type of Home: House Home Layout: One level Home Access: Ramped entrance Bathroom Shower/Tub: Multimedia programmer: Standard Bathroom Accessibility: Yes How Accessible: Accessible via walker Additional Comments: Per PT called spouse:  aide 3 days, 10-5 really as supervision and to take him out in the community. Pt has hearing aids and per son has always been very energetic and on the go. wife uses Elliott RW and will not be able to physically assist.   Discharge Living Setting Plans for Discharge Living Setting: Patient'Elliott home Type of Home at Discharge: House Discharge Home Layout: One level Discharge Home Access: Ramped entrance Entrance Stairs-Rails: None Discharge Bathroom Shower/Tub: Walk-in shower, [place] unit Discharge Bathroom Toilet:  Standard Discharge Bathroom Accessibility: Yes How Accessible: Accessible via walker   Social/Family/Support Systems Patient Roles: Spouse Contact Information: [phone] Anticipated Caregiver: june (wife) Ability/Limitations of Caregiver: Can provide light min Elliott Caregiver Availability: 24/7 Discharge Plan Discussed with Primary Caregiver: Yes Is Caregiver In Agreement with Plan?: Yes   Goals Patient/Family Goal for Rehab: PT/OT Supervision Expected length of stay: 12-14 days Pt/Family Agrees to Admission and willing to participate: Yes Program Orientation Provided & Reviewed with Pt/Caregiver Including Roles  & Responsibilities: Yes   Decrease burden of Care through IP rehab admission: Specialzed equipment needs, Decrease number of caregivers, Bowel and bladder program, and Patient/family education   Possible need for SNF placement upon discharge: Not anticipated   Patient Condition: I have reviewed medical records from [facility], spoken with CM, and patient, spouse, and son. I met with patient at the bedside and discussed via phone for inpatient rehabilitation assessment.  Patient will benefit from ongoing PT, OT, and SLP, can actively participate in 3 hours of therapy Elliott day 5 days of the week, and can make measurable gains during the admission.  Patient will also benefit from the coordinated team approach during an Inpatient Acute Rehabilitation admission.  The patient will receive intensive therapy as well as Rehabilitation physician, nursing, social worker, and care management interventions.  Due to bladder management, bowel management, safety, skin/wound care, disease management, medication administration, pain management, and patient education the patient requires 24 hour Elliott day rehabilitation nursing.  The patient is currently ModA-mod +2 with mobility and basic ADLs.  Discharge setting and therapy post discharge at home with home health is anticipated.  Patient has  agreed to participate in the Acute Inpatient Rehabilitation Program and will admit today.   Preadmission Screen Completed By:  Retta Diones, 06/29/2021 3:20 PM ______________________________________________________________________   Discussed status with Dr. Ranell Patrick on 06/29/21 at 3:00 pm  and received approval for admission today.   Admission Coordinator:  Retta Diones, RN, time 3:19 pm/Date 06/29/21    Assessment/Plan: Diagnosis: Left MCA CVA Does the need for close, 24 hr/day Medical supervision in concert with the patient'Elliott rehab needs make it unreasonable for this patient to be served in Elliott less intensive setting? Yes Co-Morbidities requiring supervision/potential complications: HTN, Elliott fib, left rotator cuff tear Due to bladder management, bowel management, safety, skin/wound care, disease management, medication administration, pain management, and patient education, does the patient require 24 hr/day rehab nursing? Yes Does the patient require coordinated care of Elliott physician, rehab nurse, PT, OT, and SLP to address physical and functional deficits in the context of the above medical diagnosis(es)? Yes Addressing deficits in the following areas: balance, endurance, locomotion, strength, transferring, bowel/bladder control, bathing, dressing, feeding, grooming, toileting, and psychosocial support Can the patient actively participate in an intensive therapy program of at least 3 hrs of therapy 5 days Elliott week? Yes The potential for patient to make measurable gains while on inpatient rehab is excellent Anticipated functional outcomes upon discharge from inpatient rehab: supervision PT, supervision OT, n/Elliott SLP Estimated rehab length of stay to reach the above functional goals is: 12-14 days Anticipated discharge destination: Home 10. Overall Rehab/Functional Prognosis: excellent     Devon Elliott Signature: Devon Staggers, Devon Elliott, [place]  Director Rehabilitation Services 06/29/2021          Revision History                                                                       Note Details  Author Devon Staggers, Devon Elliott File Time 06/29/2021  4:12 PM  Author Type Physician Status Signed  Last Editor Devon Staggers, Devon Elliott Service CASE MANAGEMENT

## 2021-06-29 NOTE — H&P (Incomplete)
Physical Medicine and Rehabilitation Admission H&P    CC: Stroke Weakness  HPI: Devon Elliott. Whary is a 87 year old male who began experiencing mild word finding difficulties on the evening of June 18, 2021.  The following morning, his symptoms worsened and family called EMS.  The patient, at that time was combative, with EMS personnel.  He arrived at [facility] emergency department, assessed and neurology consulted.  Code stroke was activated by the emergency department physician. He was found to have dense global aphasia and was unable to follow any commands.  He remained alert and protecting his airway. He was exhibiting expressive aphasia as well.  He had antigravity in his right leg with some movement against gravity.  CTH NAICP w/ ASPECTS 10.   CT of the head, CTA of head and CT perfusion were completed.  The patient was not a candidate for IV thrombolysis due to LKW of greater than 4.5 hours.  Imaging findings were consistent with acute left MCA M2 occlusion.  Dr. Quinn Axe discussed the findings with the patient and with Dr. Earleen Newport at [facility] neuro interventional radiology.  The patient and his family agreed to transfer to [facility].  Patient and family were counseled regarding interventional revascularization.  Dr. Earleen Newport felt given his profile, he would be a reasonable candidate for attempted mechanical thrombectomy.  This was performed via right common femoral artery access by Dr. Earleen Newport on June 19, 2021 at 3 PM.  He was extubated and admitted to the ICU.  He exhibited postprocedure agitation and restlessness.  He was medicated and noted to have transient hypotension and mild hypoxia.  Cleviprex was tapered off.  Critical care management was consulted; neurology following. MCA infarct felt likely secondary to atrial fibrillation. His hyperactive delirium continued and he was placed on low-dose Precedex infusion.  Palliative care was consulted to establish goals of care.   Remained full code with full scope of care at that time. Lovenox for DVT prophylaxis.  Heparin infusion started on June 21, 2021. EEG performed: suggestive of cortical dysfunction in right hemisphere as well as mild diffuse encephalopathy, nonspecific etiology.  No seizures or epileptiform discharges were seen throughout the recording. Heparin infusion discontinued and transitioned to Lovenox 65 mg q 24 hours due to difficulty obtaining labs. Follow-up CT head without acute change. Extracranial carotid arteries <50% stenosis. Able to cooperate with MRI of head June 24, 2021. 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 [name]. Patient is now DNR. The patient requires inpatient medicine and rehabilitation evaluations and services for ongoing dysfunction secondary to left MCA infarct.  2D echocardiogram performed June 20, 2021 with estimated EF of 45%.  The patient lives at home with his wife, June.  She states the patient walked with a walker at baseline and had no baseline speech or cognitive deficits prior to current event.  Performs majority of his ADLs.  Wears hearing aids. Patient on minimal medications at home.    His past medical history is significant for hypertension on beta-blocker. History of hyperlipidemia, no antilipid therapy PTA.  Remote history of atrial fibrillation not on anticoagulation PTA. Wife reports: "very mild dementia".  Review of Systems  HENT:  Positive for hearing loss.   Respiratory:  Negative for shortness of breath and wheezing.   Gastrointestinal:  Negative for abdominal pain.  Neurological:        Alert. Oriented to name only. States he is currently in [place]. Follows simple commands.  Endo/Heme/Allergies:  Bruises/bleeds easily.  Past Medical History:  Diagnosis Date   Complete tear of left rotator cuff 07/21/2014   Essential hypertension 04/19/2014   Impingement syndrome of left shoulder 07/21/2014    Left hip pain 02/08/2016   Left supraspinatus tenosynovitis 07/21/2014   Pure hypercholesterolemia 04/19/2014   Past Surgical History:  Procedure Laterality Date   ELBOW SURGERY  1995   ESOPHAGOGASTRODUODENOSCOPY N/A 05/01/2020   Procedure: ESOPHAGOGASTRODUODENOSCOPY (EGD);  Surgeon: Toledo, Benay Pike, MD;  Location: [facility] ENDOSCOPY;  Service: Gastroenterology;  Laterality: N/A;   ESOPHAGOGASTRODUODENOSCOPY (EGD) WITH PROPOFOL N/A 05/31/2016   Procedure: ESOPHAGOGASTRODUODENOSCOPY (EGD) WITH PROPOFOL;  Surgeon: Jonathon Bellows, MD;  Location: [facility] ENDOSCOPY;  Service: Endoscopy;  Laterality: N/A;   IR CT HEAD LTD  06/19/2021   IR PERCUTANEOUS ART THROMBECTOMY/INFUSION INTRACRANIAL INC DIAG ANGIO  06/19/2021   IR US GUIDE VASC ACCESS RIGHT  06/19/2021   KYPHOPLASTY N/A 07/30/2019   Procedure: L1 KYPHOPLASTY;  Surgeon: Hessie Knows, MD;  Location: [facility] ORS;  Service: Orthopedics;  Laterality: N/A;   RADIOLOGY WITH ANESTHESIA N/A 06/19/2021   Procedure: IR WITH ANESTHESIA;  Surgeon: Radiologist, Medication, MD;  Location: [place];  Service: Radiology;  Laterality: N/A;   Family History  Problem Relation Age of Onset   Pancreatic cancer Mother    Diabetes Father    Social History:  reports that he has quit smoking. He has never used smokeless tobacco. He reports current alcohol use. He reports that he does not use drugs. Allergies:  Allergies  Allergen Reactions   Ace Inhibitors     Other reaction(s): Kidney Disorder   Clarithromycin Hives   Etodolac     Other reaction(s): Kidney Disorder   Levofloxacin Swelling   Medications Prior to Admission  Medication Sig Dispense Refill   acetaminophen (TYLENOL) 500 MG tablet Take 1,000 mg by mouth every 8 (eight) hours as needed for mild pain.     aspirin 81 MG EC tablet Take 81 tablets by mouth 2 (two) times a week.     Cholecalciferol 25 MCG (1000 UT) capsule Take 1,000 Units by mouth daily.     memantine (NAMENDA) 5 MG tablet Take 5 mg by mouth every  evening.     traMADol (ULTRAM) 50 MG tablet Take 50 mg by mouth 2 (two) times daily as needed for moderate pain.     metoprolol succinate (TOPROL-XL) 25 MG 24 hr tablet Take 1 tablet (25 mg total) by mouth daily. 30 tablet 0   pantoprazole (PROTONIX) 40 MG tablet Take 1 tablet (40 mg total) by mouth daily. (Patient not taking: Reported on 06/20/2021) 30 tablet 0    Drug Regimen Review  Drug regimen was reviewed and remains appropriate with no significant issues identified  Home: Home Living Family/patient expects to be discharged to:: Private residence Living Arrangements: Spouse/significant other Available Help at Discharge: Available 24 hours/day Type of Home: House Home Access: Ramped entrance Home Layout: One level Bathroom Shower/Tub: Multimedia programmer: Standard Bathroom Accessibility: Yes Home Equipment: Conservation officer, nature (2 wheels), Other (comment), Cane - single point, Shower seat Additional Comments: Per PT called spouse:  aide 3 days, 10-5 really as supervision and to take him out in the community. Pt has hearing aids and per son has always been very energetic and on the go. wife uses a RW and will not be able to physically assist.  Lives With: Spouse   Functional History: Prior Function Prior Level of Function : Independent/Modified Independent Mobility Comments: walks with RW ADLs  Comments: Supervision for ADL/IADL  Functional Status:  Mobility: Bed Mobility Overal bed mobility: Needs Assistance Bed Mobility: Supine to Sit, Sit to Supine Supine to sit: Min assist, +2 for physical assistance Sit to supine: Mod assist, +2 for physical assistance General bed mobility comments: patient initiating bringing LEs towards EOB and assist for trunk elevation Transfers Overall transfer level: Needs assistance Equipment used: 2 person hand held assist Transfers: Sit to/from Stand Sit to Stand: Mod assist, +2 safety/equipment, From elevated surface Bed to/from  chair/wheelchair/BSC transfer type:: Step pivot Step pivot transfers: Min assist, +2 safety/equipment General transfer comment: modA+2 to rise and steady. Encouragement required to complete Ambulation/Gait Ambulation/Gait assistance: Mod assist, +2 safety/equipment Gait Distance (Feet): 15 Feet Assistive device: 2 person hand held assist Gait Pattern/deviations: Step-to pattern, Decreased stride length, Wide base of support, Shuffle General Gait Details: shuffling gait pattern with wide BOS. HHAx2 for safety and modA+2 for balance throughout. Max encouragement required to perform short mobility. Patient seems to have fear of falling Gait velocity: decreased Gait velocity interpretation: <1.31 ft/sec, indicative of household ambulator    ADL: ADL Overall ADL's : Needs assistance/impaired Grooming: Wash/dry face, Minimal assistance, Sitting Grooming Details (indicate cue type and reason): Max A for standing balance while washing his face at sink. Mod cues for sequencing of washing his face. becoming distracted by wiping sink counter with wash clothe instead. Upper Body Bathing: Moderate assistance, Sitting Lower Body Bathing: Maximal assistance, Sitting/lateral leans Upper Body Dressing : Moderate assistance, Sitting Lower Body Dressing: Maximal assistance, Sit to/from stand Toilet Transfer: Maximal assistance (simulated at recliner) Toileting- Clothing Manipulation and Hygiene: Maximal assistance, Sitting/lateral lean Functional mobility during ADLs: Moderate assistance, Maximal assistance (sit<>stand) General ADL Comments: Pt with increased lethargy at begining of session. As he became more aroused, pt was able to particiapte more in activities. Pt performing sit<>stand at sink to wash his face.  Cognition: Cognition Overall Cognitive Status: History of cognitive impairments - at baseline Arousal/Alertness: Awake/alert Orientation Level: Oriented X4 Attention: Sustained Sustained  Attention: Impaired Sustained Attention Impairment: Verbal basic Awareness: Impaired Awareness Impairment: Intellectual impairment Cognition Arousal/Alertness: Awake/alert Behavior During Therapy: WFL for tasks assessed/performed Overall Cognitive Status: History of cognitive impairments - at baseline Area of Impairment: Orientation, Attention, Memory, Following commands, Safety/judgement, Awareness, Problem solving Orientation Level: Disoriented to, Time, Place, Situation Current Attention Level: Focused, Sustained Memory: Decreased short-term memory Following Commands: Follows one step commands inconsistently, Follows one step commands with increased time Safety/Judgement: Decreased awareness of safety, Decreased awareness of deficits Awareness: Intellectual Problem Solving: Requires verbal cues, Requires tactile cues, Slow processing, Decreased initiation, Difficulty sequencing General Comments: baseline confusion with hx of dementia. Following ~75% of commands with encouragement.  Physical Exam: Blood pressure (!) 146/78, pulse 100, temperature 98 F (36.7 C), temperature source Oral, resp. rate 17, height 5\' 9"  (1.753 m), SpO2 97 %. Physical Exam Constitutional:      General: He is not in acute distress. Cardiovascular:     Rate and Rhythm: Rhythm irregular.  Abdominal:     General: Bowel sounds are normal.     Palpations: Abdomen is soft.  Genitourinary:    Comments: Indwelling Foley catheter in place Neurological:     Mental Status: He is alert.    Results for orders placed or performed during the hospital encounter of 06/19/21 (from the past 48 hour(s))  CBC     Status: Abnormal   Collection Time: 06/28/21  3:18 AM  Result Value Ref Range   WBC 10.1 4.0 -  10.5 K/uL   RBC 3.72 (L) 4.22 - 5.81 MIL/uL   Hemoglobin 11.2 (L) 13.0 - 17.0 g/dL   HCT 34.2 (L) 39.0 - 52.0 %   MCV 91.9 80.0 - 100.0 fL   MCH 30.1 26.0 - 34.0 pg   MCHC 32.7 30.0 - 36.0 g/dL   RDW 14.7 11.5 -  15.5 %   Platelets 170 150 - 400 K/uL   nRBC 0.0 0.0 - 0.2 %    Comment: Performed at [facility], [place] [address], [place], [place] [id number]  Basic metabolic panel     Status: Abnormal   Collection Time: 06/28/21  3:18 AM  Result Value Ref Range   Sodium 141 135 - 145 mmol/L   Potassium 3.5 3.5 - 5.1 mmol/L   Chloride 111 98 - 111 mmol/L   CO2 24 22 - 32 mmol/L   Glucose, Bld 125 (H) 70 - 99 mg/dL    Comment: Glucose reference range applies only to samples taken after fasting for at least 8 hours.   BUN 25 (H) 8 - 23 mg/dL   Creatinine, Ser 1.19 0.61 - 1.24 mg/dL   Calcium 7.8 (L) 8.9 - 10.3 mg/dL   GFR, Estimated 57 (L) >60 mL/min    Comment: (NOTE) Calculated using the CKD-EPI Creatinine Equation (2021)    Anion gap 6 5 - 15    Comment: Performed at [facility] [address] [place], [place] [id number]  Basic metabolic panel     Status: Abnormal   Collection Time: 06/29/21  4:56 AM  Result Value Ref Range   Sodium 142 135 - 145 mmol/L   Potassium 4.1 3.5 - 5.1 mmol/L   Chloride 109 98 - 111 mmol/L   CO2 24 22 - 32 mmol/L   Glucose, Bld 115 (H) 70 - 99 mg/dL    Comment: Glucose reference range applies only to samples taken after fasting for at least 8 hours.   BUN 26 (H) 8 - 23 mg/dL   Creatinine, Ser 1.29 (H) 0.61 - 1.24 mg/dL   Calcium 8.5 (L) 8.9 - 10.3 mg/dL   GFR, Estimated 52 (L) >60 mL/min    Comment: (NOTE) Calculated using the CKD-EPI Creatinine Equation (2021)    Anion gap 9 5 - 15    Comment: Performed at [facility] [address], [place], [place] [address]  Magnesium     Status: None   Collection Time: 06/29/21  4:56 AM  Result Value Ref Range   Magnesium 2.4 1.7 - 2.4 mg/dL    Comment: Performed at [facility] [address] [place], [place] [address]   No results found.     Medical Problem List and Plan: 1. Functional deficits secondary to left MCA infarct likely secondary to embolism  -patient may ***  shower  -ELOS/Goals: *** 2.  Antithrombotics: -DVT/anticoagulation:  Pharmaceutical: Other (comment)  apixaban 5 mg BID  -antiplatelet therapy: none 3. Pain Management: Tylenol 4. Mood: Depakote,   -antipsychotic agents: Seroquel 25 mg daily and q HS, Seroquel 25 mg q 6 hours PRN agitation;  Namenda 5. Neuropsych: This patient is not capable of making decisions on his own behalf. 6. Skin/Wound Care: routine skin checks 7. Fluids/Electrolytes/Nutrition: routine ins and outs with follow-up chemistries. Hypokalemia resolved. 8. Atrial fibrillation: rate controlled. Metoprolol 25 mg BID, Eliquis 5 mg daily 9. Hypertension: goal 0000000 systolic BP post thrombectomy. Continue metoprolol. Amlodipine added 12/13 10: Hyperlipidemia: LDL =  110; goal <  70. Started on high intensity statin: atorvastatin 40 mg. Continue at discharge. 11: Delirium with history of mild dementia. Continue Depakote, Seroquel and Namenda 12: HFrEF: currently euvolemic 13: AKI: resolved. Recheck serum creatinine 14: Urinary retention: Foley cath placed 12/9. Will discontinue in AM. Orders in place for bladder scanning, PVR check and in/out cath if necessary ***  Barbie Banner, PA-C 06/29/2021

## 2021-06-30 ENCOUNTER — Inpatient Hospital Stay (HOSPITAL_COMMUNITY): Payer: Medicare Other

## 2021-06-30 DIAGNOSIS — I749 Embolism and thrombosis of unspecified artery: Secondary | ICD-10-CM

## 2021-06-30 LAB — CBC WITH DIFFERENTIAL/PLATELET
Abs Immature Granulocytes: 0.06 10*3/uL (ref 0.00–0.07)
Basophils Absolute: 0.1 10*3/uL (ref 0.0–0.1)
Basophils Relative: 1 %
Eosinophils Absolute: 0.1 10*3/uL (ref 0.0–0.5)
Eosinophils Relative: 1 %
HCT: 35 % — ABNORMAL LOW (ref 39.0–52.0)
Hemoglobin: 11.5 g/dL — ABNORMAL LOW (ref 13.0–17.0)
Immature Granulocytes: 1 %
Lymphocytes Relative: 8 %
Lymphs Abs: 0.8 10*3/uL (ref 0.7–4.0)
MCH: 30.3 pg (ref 26.0–34.0)
MCHC: 32.9 g/dL (ref 30.0–36.0)
MCV: 92.3 fL (ref 80.0–100.0)
Monocytes Absolute: 0.8 10*3/uL (ref 0.1–1.0)
Monocytes Relative: 8 %
Neutro Abs: 8.1 10*3/uL — ABNORMAL HIGH (ref 1.7–7.7)
Neutrophils Relative %: 81 %
Platelets: 231 10*3/uL (ref 150–400)
RBC: 3.79 MIL/uL — ABNORMAL LOW (ref 4.22–5.81)
RDW: 14.9 % (ref 11.5–15.5)
WBC: 9.9 10*3/uL (ref 4.0–10.5)
nRBC: 0 % (ref 0.0–0.2)

## 2021-06-30 LAB — COMPREHENSIVE METABOLIC PANEL
ALT: 103 U/L — ABNORMAL HIGH (ref 0–44)
AST: 203 U/L — ABNORMAL HIGH (ref 15–41)
Albumin: 2.3 g/dL — ABNORMAL LOW (ref 3.5–5.0)
Alkaline Phosphatase: 69 U/L (ref 38–126)
Anion gap: 9 (ref 5–15)
BUN: 24 mg/dL — ABNORMAL HIGH (ref 8–23)
CO2: 25 mmol/L (ref 22–32)
Calcium: 8.6 mg/dL — ABNORMAL LOW (ref 8.9–10.3)
Chloride: 109 mmol/L (ref 98–111)
Creatinine, Ser: 1.05 mg/dL (ref 0.61–1.24)
GFR, Estimated: >60 mL/min (ref >60)
Glucose, Bld: 113 mg/dL — ABNORMAL HIGH (ref 70–99)
Potassium: 4 mmol/L (ref 3.5–5.1)
Sodium: 143 mmol/L (ref 135–145)
Total Bilirubin: 0.6 mg/dL (ref 0.3–1.2)
Total Protein: 5.8 g/dL — ABNORMAL LOW (ref 6.5–8.1)

## 2021-06-30 LAB — MAGNESIUM: Magnesium: 2 mg/dL (ref 1.7–2.4)

## 2021-06-30 MED ORDER — VITAMIN D 25 MCG (1000 UNIT) PO TABS
1000.0000 [IU] | ORAL_TABLET | Freq: Every day | ORAL | Status: DC
  Administered 2021-06-30 – 2021-07-18 (×19): 1000 [IU] via ORAL
  Filled 2021-06-30 (×19): qty 1

## 2021-06-30 NOTE — H&P (Addendum)
Physical Medicine and Rehabilitation Admission H&P     CC: Stroke Weakness   HPI: Devon Elliott is a 87 year old male who began experiencing mild word finding difficulties on the evening of June 18, 2021.  The following morning, his symptoms worsened and family called EMS.  The patient, at that time was combative, with EMS personnel.  He arrived at [facility] emergency department, assessed and neurology consulted.  Code stroke was activated by the emergency department physician. He was found to have dense global aphasia and was unable to follow any commands.  He remained alert and protecting his airway. He was exhibiting expressive aphasia as well.  He had antigravity in his right leg with some movement against gravity.  CTH NAICP w/ ASPECTS 10.    CT of the head, CTA of head and CT perfusion were completed.  The patient was not a candidate for IV thrombolysis due to LKW of greater than 4.5 hours.  Imaging findings were consistent with acute left MCA M2 occlusion.  Dr. Quinn Axe discussed the findings with the patient and with Dr. Earleen Newport at [facility] neuro interventional radiology.  The patient and his family agreed to transfer to [facility].  Patient and family were counseled regarding interventional revascularization.  Dr. Earleen Newport felt given his profile, he would be a reasonable candidate for attempted mechanical thrombectomy.  This was performed via right common femoral artery access by Dr. Earleen Newport on June 19, 2021 at 3 PM.  He was extubated and admitted to the ICU.  He exhibited postprocedure agitation and restlessness.  He was medicated and noted to have transient hypotension and mild hypoxia.  Cleviprex was tapered off.  Critical care management was consulted; neurology following. MCA infarct felt likely secondary to atrial fibrillation. His hyperactive delirium continued and he was placed on low-dose Precedex infusion.  Palliative care was consulted to establish goals of care.   Remained full code with full scope of care at that time. Lovenox for DVT prophylaxis.  Heparin infusion started on June 21, 2021. EEG performed: suggestive of cortical dysfunction in right hemisphere as well as mild diffuse encephalopathy, nonspecific etiology.  No seizures or epileptiform discharges were seen throughout the recording. Heparin infusion discontinued and transitioned to Lovenox 65 mg q 24 hours due to difficulty obtaining labs. Follow-up CT head without acute change. Extracranial carotid arteries <50% stenosis. Able to cooperate with MRI of head June 24, 2021. 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 [name]. Patient is now DNR. The patient requires inpatient medicine and rehabilitation evaluations and services for ongoing dysfunction secondary to left MCA infarct.   2D echocardiogram performed June 20, 2021 with estimated EF of 45%.   The patient lives at home with his wife, June.  She states the patient walked with a walker at baseline and had no baseline speech or cognitive deficits prior to current event.  Performs majority of his ADLs.  Wears hearing aids. Patient on minimal medications at home.     His past medical history is significant for hypertension on beta-blocker. History of hyperlipidemia, no antilipid therapy PTA.  Remote history of atrial fibrillation not on anticoagulation PTA. Wife reports: "very mild dementia".  C/o Right hand pain with gripping noted by OT   Review of Systems  HENT:  Positive for hearing loss.   Respiratory:  Negative for shortness of breath and wheezing.   Gastrointestinal:  Negative for abdominal pain.  Neurological:        Alert.  Oriented to name only. States he is currently in [place]. Follows simple commands.   Endo/Heme/Allergies:  Bruises/bleeds easily.      Past Medical History:  Diagnosis Date   Complete tear of left rotator cuff 07/21/2014   Essential hypertension  04/19/2014   Impingement syndrome of left shoulder 07/21/2014   Left hip pain 02/08/2016   Left supraspinatus tenosynovitis 07/21/2014   Pure hypercholesterolemia 04/19/2014         Past Surgical History:  Procedure Laterality Date   ELBOW SURGERY   1995   ESOPHAGOGASTRODUODENOSCOPY N/A 05/01/2020    Procedure: ESOPHAGOGASTRODUODENOSCOPY (EGD);  Surgeon: Toledo, Boykin Nearing, MD;  Location: [facility] ENDOSCOPY;  Service: Gastroenterology;  Laterality: N/A;   ESOPHAGOGASTRODUODENOSCOPY (EGD) WITH PROPOFOL N/A 05/31/2016    Procedure: ESOPHAGOGASTRODUODENOSCOPY (EGD) WITH PROPOFOL;  Surgeon: Wyline Mood, MD;  Location: [facility] ENDOSCOPY;  Service: Endoscopy;  Laterality: N/A;   IR CT HEAD LTD   06/19/2021   IR PERCUTANEOUS ART THROMBECTOMY/INFUSION INTRACRANIAL INC DIAG ANGIO   06/19/2021   IR US GUIDE VASC ACCESS RIGHT   06/19/2021   KYPHOPLASTY N/A 07/30/2019    Procedure: L1 KYPHOPLASTY;  Surgeon: Kennedy Bucker, MD;  Location: [facility] ORS;  Service: Orthopedics;  Laterality: N/A;   RADIOLOGY WITH ANESTHESIA N/A 06/19/2021    Procedure: IR WITH ANESTHESIA;  Surgeon: Radiologist, Medication, MD;  Location: [facility] OR;  Service: Radiology;  Laterality: N/A;         Family History  Problem Relation Age of Onset   Pancreatic cancer Mother     Diabetes Father      Social History:  reports that he has quit smoking. He has never used smokeless tobacco. He reports current alcohol use. He reports that he does not use drugs. Allergies:       Allergies  Allergen Reactions   Ace Inhibitors        Other reaction(s): Kidney Disorder   Clarithromycin Hives   Etodolac        Other reaction(s): Kidney Disorder   Levofloxacin Swelling          Medications Prior to Admission  Medication Sig Dispense Refill   acetaminophen (TYLENOL) 500 MG tablet Take 1,000 mg by mouth every 8 (eight) hours as needed for mild pain.       aspirin 81 MG EC tablet Take 81 tablets by mouth 2 (two) times a week.       Cholecalciferol 25 MCG  (1000 UT) capsule Take 1,000 Units by mouth daily.       memantine (NAMENDA) 5 MG tablet Take 5 mg by mouth every evening.       traMADol (ULTRAM) 50 MG tablet Take 50 mg by mouth 2 (two) times daily as needed for moderate pain.       metoprolol succinate (TOPROL-XL) 25 MG 24 hr tablet Take 1 tablet (25 mg total) by mouth daily. 30 tablet 0   pantoprazole (PROTONIX) 40 MG tablet Take 1 tablet (40 mg total) by mouth daily. (Patient not taking: Reported on 06/20/2021) 30 tablet 0      Drug Regimen Review  Drug regimen was reviewed and remains appropriate with no significant issues identified   Home: Home Living Family/patient expects to be discharged to:: Private residence Living Arrangements: Spouse/significant other Available Help at Discharge: Available 24 hours/day Type of Home: House Home Access: Ramped entrance Home Layout: One level Bathroom Shower/Tub: Health visitor: Standard Bathroom Accessibility: Yes Home Equipment: Agricultural consultant (2 wheels), Other (comment), Cane - single point, [employer]  seat Additional Comments: Per PT called spouse:  aide 3 days, 10-5 really as supervision and to take him out in the community. Pt has hearing aids and per son has always been very energetic and on the go. wife uses a RW and will not be able to physically assist.  Lives With: Spouse   Functional History: Prior Function Prior Level of Function : Independent/Modified Independent Mobility Comments: walks with RW ADLs Comments: Supervision for ADL/IADL   Functional Status:  Mobility: Bed Mobility Overal bed mobility: Needs Assistance Bed Mobility: Supine to Sit, Sit to Supine Supine to sit: Min assist, +2 for physical assistance Sit to supine: Mod assist, +2 for physical assistance General bed mobility comments: patient initiating bringing LEs towards EOB and assist for trunk elevation Transfers Overall transfer level: Needs assistance Equipment used: 2 person hand held  assist Transfers: Sit to/from Stand Sit to Stand: Mod assist, +2 safety/equipment, From elevated surface Bed to/from chair/wheelchair/BSC transfer type:: Step pivot Step pivot transfers: Min assist, +2 safety/equipment General transfer comment: modA+2 to rise and steady. Encouragement required to complete Ambulation/Gait Ambulation/Gait assistance: Mod assist, +2 safety/equipment Gait Distance (Feet): 15 Feet Assistive device: 2 person hand held assist Gait Pattern/deviations: Step-to pattern, Decreased stride length, Wide base of support, Shuffle General Gait Details: shuffling gait pattern with wide BOS. HHAx2 for safety and modA+2 for balance throughout. Max encouragement required to perform short mobility. Patient seems to have fear of falling Gait velocity: decreased Gait velocity interpretation: <1.31 ft/sec, indicative of household ambulator   ADL: ADL Overall ADL's : Needs assistance/impaired Grooming: Wash/dry face, Minimal assistance, Sitting Grooming Details (indicate cue type and reason): Max A for standing balance while washing his face at sink. Mod cues for sequencing of washing his face. becoming distracted by wiping sink counter with wash clothe instead. Upper Body Bathing: Moderate assistance, Sitting Lower Body Bathing: Maximal assistance, Sitting/lateral leans Upper Body Dressing : Moderate assistance, Sitting Lower Body Dressing: Maximal assistance, Sit to/from stand Toilet Transfer: Maximal assistance (simulated at recliner) Toileting- Clothing Manipulation and Hygiene: Maximal assistance, Sitting/lateral lean Functional mobility during ADLs: Moderate assistance, Maximal assistance (sit<>stand) General ADL Comments: Pt with increased lethargy at begining of session. As he became more aroused, pt was able to particiapte more in activities. Pt performing sit<>stand at sink to wash his face.   Cognition: Cognition Overall Cognitive Status: History of cognitive  impairments - at baseline Arousal/Alertness: Awake/alert Orientation Level: Oriented X4 Attention: Sustained Sustained Attention: Impaired Sustained Attention Impairment: Verbal basic Awareness: Impaired Awareness Impairment: Intellectual impairment Cognition Arousal/Alertness: Awake/alert Behavior During Therapy: WFL for tasks assessed/performed Overall Cognitive Status: History of cognitive impairments - at baseline Area of Impairment: Orientation, Attention, Memory, Following commands, Safety/judgement, Awareness, Problem solving Orientation Level: Disoriented to, Time, Place, Situation Current Attention Level: Focused, Sustained Memory: Decreased short-term memory Following Commands: Follows one step commands inconsistently, Follows one step commands with increased time Safety/Judgement: Decreased awareness of safety, Decreased awareness of deficits Awareness: Intellectual Problem Solving: Requires verbal cues, Requires tactile cues, Slow processing, Decreased initiation, Difficulty sequencing General Comments: baseline confusion with hx of dementia. Following ~75% of commands with encouragement.   Physical Exam: Blood pressure (!) 146/78, pulse 100, temperature 98 F (36.7 C), temperature source Oral, resp. rate 17, height 5\' 9"  (1.753 m), SpO2 97 %. Physical Exam Constitutional:      General: He is not in acute distress. Cardiovascular:     Rate and Rhythm: Rhythm irregular.  Abdominal:     General: Bowel sounds are normal.  Palpations: Abdomen is soft.  Genitourinary:    Comments: Indwelling Foley catheter in place Neurological:     Mental Status: He is alert.    General: No acute distress Mood and affect are appropriate Heart: Regular rate and rhythm no rubs murmurs or extra sounds Lungs: Clear to auscultation, breathing unlabored, no rales or wheezes Abdomen: Positive bowel sounds, soft nontender to palpation, nondistended Extremities: No clubbing, cyanosis, or  edema Skin: No evidence of breakdown, no evidence of rash Neurologic: Cranial nerves II through XII intact, motor strength is 5/5 in left 3/5 RIght deltoid, bicep, tricep, grip, hip flexor, knee extensors, ankle dorsiflexor and plantar flexor Sensory exam difficult to assess due to aphasia  Pt expresses at short phrase level, difficulty following 2 step commands   Musculoskeletal: Full range of motion in all 4 extremities. No joint swelling, pain with RIght hand grasp no MCP, PIP or DIP effusion , 1x2 cm scab on dorsal surface of hand     Lab Results Last 48 Hours        Results for orders placed or performed during the hospital encounter of 06/19/21 (from the past 48 hour(s))  CBC     Status: Abnormal    Collection Time: 06/28/21  3:18 AM  Result Value Ref Range    WBC 10.1 4.0 - 10.5 K/uL    RBC 3.72 (L) 4.22 - 5.81 MIL/uL    Hemoglobin 11.2 (L) 13.0 - 17.0 g/dL    HCT 34.2 (L) 39.0 - 52.0 %    MCV 91.9 80.0 - 100.0 fL    MCH 30.1 26.0 - 34.0 pg    MCHC 32.7 30.0 - 36.0 g/dL    RDW 14.7 11.5 - 15.5 %    Platelets 170 150 - 400 K/uL    nRBC 0.0 0.0 - 0.2 %      Comment: Performed at [facility], [place] [address] [place], [place] [id number]  Basic metabolic panel     Status: Abnormal    Collection Time: 06/28/21  3:18 AM  Result Value Ref Range    Sodium 141 135 - 145 mmol/L    Potassium 3.5 3.5 - 5.1 mmol/L    Chloride 111 98 - 111 mmol/L    CO2 24 22 - 32 mmol/L    Glucose, Bld 125 (H) 70 - 99 mg/dL      Comment: Glucose reference range applies only to samples taken after fasting for at least 8 hours.    BUN 25 (H) 8 - 23 mg/dL    Creatinine, Ser 1.19 0.61 - 1.24 mg/dL    Calcium 7.8 (L) 8.9 - 10.3 mg/dL    GFR, Estimated 57 (L) >60 mL/min      Comment: (NOTE) Calculated using the CKD-EPI Creatinine Equation (2021)      Anion gap 6 5 - 15      Comment: Performed at [facility] [address] [place], [place] [id number]  Basic metabolic panel     Status:  Abnormal    Collection Time: 06/29/21  4:56 AM  Result Value Ref Range    Sodium 142 135 - 145 mmol/L    Potassium 4.1 3.5 - 5.1 mmol/L    Chloride 109 98 - 111 mmol/L    CO2 24 22 - 32 mmol/L    Glucose, Bld 115 (H) 70 - 99 mg/dL      Comment: Glucose reference range applies only to samples taken after fasting for at least 8 hours.  BUN 26 (H) 8 - 23 mg/dL    Creatinine, Ser 1.29 (H) 0.61 - 1.24 mg/dL    Calcium 8.5 (L) 8.9 - 10.3 mg/dL    GFR, Estimated 52 (L) >60 mL/min      Comment: (NOTE) Calculated using the CKD-EPI Creatinine Equation (2021)      Anion gap 9 5 - 15      Comment: Performed at [facility] [address] [place], [place] [address]  Magnesium     Status: None    Collection Time: 06/29/21  4:56 AM  Result Value Ref Range    Magnesium 2.4 1.7 - 2.4 mg/dL      Comment: Performed at [facility] [address], [place], [place] [address]      Imaging Results (Last 48 hours)  No results found.           Medical Problem List and Plan: 1. Functional deficits secondary to left MCA infarct likely secondary to embolism Righ themiparesis and moderate global aphasia , suspect apraxia as well              -patient may  shower             -ELOS/Goals: 12-14d 2.  Antithrombotics: -DVT/anticoagulation:  Pharmaceutical: Other (comment)  apixaban 5 mg BID             -antiplatelet therapy: none 3. Pain Management: Tylenol 4. Mood: Depakote,              -antipsychotic agents: Seroquel 25 mg daily and q HS, Seroquel 25 mg q 6 hours PRN agitation;  Namenda 5. Neuropsych: This patient is not capable of making decisions on his own behalf. 6. Skin/Wound Care: routine skin checks 7. Fluids/Electrolytes/Nutrition: routine ins and outs with follow-up chemistries. Hypokalemia resolved. 8. Atrial fibrillation: rate controlled. Metoprolol 25 mg BID, Eliquis 5 mg daily 9. Hypertension: goal 0000000 systolic BP post thrombectomy. Continue metoprolol. Amlodipine  added 12/13 10: Hyperlipidemia: LDL =  110; goal <70. Started on high intensity statin: atorvastatin 40 mg. Continue at discharge. 11: Delirium with history of mild dementia. Continue Depakote, Seroquel and Namenda Reduced comprehension associated with receptive language deficits are contributing  12: HFrEF: currently euvolemic 13: AKI: resolved. Recheck serum creatinine 14: Urinary retention: Foley cath placed 12/9. Will discontinue in AM. Orders in place for bladder scanning, PVR check and in/out cath if necessary    Barbie Banner, PA-C 06/29/2021  "I have personally performed a face to face diagnostic evaluation of this patient.  Additionally, I have reviewed and concur with the physician assistant's documentation above." Charlett Blake M.D. [employer] Fellow Am Acad of Phys Med and Rehab Diplomate Am Board of Electrodiagnostic Med Fellow Am Board of Interventional Pain

## 2021-06-30 NOTE — Progress Notes (Signed)
Radiology reported Right hand x-ray results to this RN. PA made aware.  Marylu Lund, RN

## 2021-06-30 NOTE — Evaluation (Addendum)
Speech Language Pathology Assessment and Plan  Patient Details  Name: Devon Elliott. 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[facility])  Past Medical History:  Past Medical History:  Diagnosis Date   Complete tear of left rotator cuff 07/21/2014   Essential hypertension 04/19/2014   Impingement syndrome of left shoulder 07/21/2014   Left hip pain 02/08/2016   Left supraspinatus tenosynovitis 07/21/2014   Pure hypercholesterolemia 04/19/2014   Past Surgical History:  Past Surgical History:  Procedure Laterality Date   ELBOW SURGERY  1995   ESOPHAGOGASTRODUODENOSCOPY N/A 05/01/2020   Procedure: ESOPHAGOGASTRODUODENOSCOPY (EGD);  Surgeon: Toledo, Benay Pike, MD;  Location: [facility] ENDOSCOPY;  Service: Gastroenterology;  Laterality: N/A;   ESOPHAGOGASTRODUODENOSCOPY (EGD) WITH PROPOFOL N/A 05/31/2016   Procedure: ESOPHAGOGASTRODUODENOSCOPY (EGD) WITH PROPOFOL;  Surgeon: Jonathon Bellows, MD;  Location: [facility] ENDOSCOPY;  Service: Endoscopy;  Laterality: N/A;   IR CT HEAD LTD  06/19/2021   IR PERCUTANEOUS ART THROMBECTOMY/INFUSION INTRACRANIAL INC DIAG ANGIO  06/19/2021   IR US GUIDE VASC ACCESS RIGHT  06/19/2021   KYPHOPLASTY N/A 07/30/2019   Procedure: L1 KYPHOPLASTY;  Surgeon: Hessie Knows, MD;  Location: [facility] ORS;  Service: Orthopedics;  Laterality: N/A;   RADIOLOGY WITH ANESTHESIA N/A 06/19/2021   Procedure: IR WITH ANESTHESIA;  Surgeon: Radiologist, Medication, MD;  Location: [place];  Service: Radiology;  Laterality: N/A;    Assessment / Plan / Recommendation Clinical Impression [name]. Devon Elliott is a 87 year old male who began experiencing mild word finding difficulties on the evening of June 18, 2021.   The following morning, his symptoms worsened and family called EMS. The patient, at that time was combative, with EMS personnel. He arrived at [facility] emergency department, assessed and neurology consulted. Code stroke was activated by the emergency department physician. He was found to have dense global aphasia and was unable to follow any commands. He remained alert and protecting his airway. He was exhibiting expressive aphasia as well. Imaging findings were consistent with acute left MCA M2 occlusion. Able to cooperate with MRI of head June 24, 2021. 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 [name]. Patient is now DNR. The patient lives at home with his wife, Devon Elliott. She states the patient walked with a walker at baseline. Wife reports: "very mild dementia". Performs majority of his ADLs.  Wears hearing aids. Patient on minimal medications at home.    Pt presents with expressive/receptive aphasia in setting of recent CVA. Pt with mild cognitive impairment at baseline per chart. Pt appeared confused during evaluation with decreased attention to task and intermittent restlessness and internal distractibility. Pt had difficulty following basic one-step commands (total A), accurately responding to biographical and environmental yes/no questions (30% accurate and max A), object naming with semantic paraphasias and perseveration, object identification, and automatic speech tasks. Pt had significant difficulty following task instructions. Pt with relative strengths in spontaneous speech but difficult to understand at times with poor awareness of errors. Vocal intensity was reduced. Speech production was clear without indication for dysarthria. Patient had MBS on 06/20/21 revealing functional oropharyngeal swallow function with recommendations for regular solids and thin liquids with crushed meds. RN noted difficulty masticating tougher consistencies on  regular tray and diet was downgraded to dysphagia 3, thin liquids. Pt  refused consideration of solid PO trials this date therefore unable to assess diet tolerance. Continue current diet at this time. Full supervision during meals for feeding assistance. Recommend follow up prior to signing off on swallowing needs. Pt's granddaughter was present for evaluation and educated on cognitive-communication, language, and swallowing deficits. Patient would benefit from skilled SLP intervention to maximize cognitive-communication, language, and swallow functioning, caregiver education, and decreased burden of care.   Skilled Therapeutic Interventions          Pt participated in portions of the Western Aphasia Battery Bedside (WAB-B) as well as further non-standardized assessments of cognitive-linguistic, speech, and language function. Partial clinical swallow exam completed. Please see above.      SLP Assessment  Patient will need skilled Speech Lanaguage Pathology Services during CIR admission    Recommendations  SLP Diet Recommendations: Dysphagia 3 (Mech soft);Thin Liquid Administration via: Straw;Cup Medication Administration: Crushed with puree Compensations: Slow rate;Small sips/bites;Minimize environmental distractions Postural Changes and/or Swallow Maneuvers: Seated upright 90 degrees;Upright 30-60 min after meal Oral Care Recommendations: Oral care BID Patient destination: Home Follow up Recommendations: 24 hour supervision/assistance Equipment Recommended: None recommended by SLP    SLP Frequency 3 to 5 out of 7 days   SLP Duration  SLP Intensity  SLP Treatment/Interventions 2.5-3 weeks  Minumum of 1-2 x/day, 30 to 90 minutes  Cognitive remediation/compensation;Environmental controls;Internal/external aids;Speech/Language facilitation;Cueing hierarchy;Multimodal communication approach;Dysphagia/aspiration precaution training;Functional tasks;Patient/family education;Therapeutic Activities     Pain  No discomfort demonstrated  Prior Functioning Cognitive/Linguistic Baseline: Baseline deficits Baseline deficit details: mild cognitive deficits per neurology Type of Home: House  Lives With: Spouse Available Help at Discharge: Available 24 hours/day Vocation: Retired  Programmer, systems Overall Cognitive Status: History of cognitive impairments - at baseline Arousal/Alertness: Awake/alert Orientation Level: Oriented to person Year:  (unrelated response) Month:  (unrelated response) Day of Week: Incorrect Attention: Focused Focused Attention: Impaired Focused Attention Impairment: Verbal basic;Functional basic Sustained Attention: Impaired Sustained Attention Impairment: Verbal basic;Functional basic Memory: Impaired (task negatively impacted by language deficits and decreased attention to task) Immediate Memory Recall:  (unable to repeat the words, kept saying "soft") Memory Recall Sock: Not able to recall Memory Recall Blue: Not able to recall Memory Recall Bed: Not able to recall Awareness: Impaired Awareness Impairment: Intellectual impairment Problem Solving: Impaired Behaviors: Restless;Perseveration Safety/Judgment: Impaired  Comprehension Auditory Comprehension Overall Auditory Comprehension: Impaired Yes/No Questions: Impaired Basic Biographical Questions: 0-25% accurate Basic Immediate Environment Questions: 0-24% accurate Commands: Impaired One Step Basic Commands: 0-24% accurate Two Step Basic Commands: 0-24% accurate Interfering Components: Attention EffectiveTechniques: Visual/Gestural cues Visual Recognition/Discrimination Discrimination: Not tested Common Objects: Unable to indentify Reading Comprehension Reading Status: Not tested Expression Expression Primary Mode of Expression: Verbal Verbal Expression Overall Verbal Expression: Impaired Initiation: No impairment Automatic Speech: Name (unable to follow directions for  additional automatic speech tasks) Level of Generative/Spontaneous Verbalization: Sentence Repetition: Impaired Level of Impairment: Phrase level Naming: Impairment Responsive: 0-25% accurate (didn't understand task) Confrontation: Impaired Common Objects: Unable to indentify Verbal Errors: Perseveration;Semantic paraphasias;Not aware of errors;Language of confusion Interfering Components: Premorbid deficit (premorbid cognitive deficits) Written Expression Written Expression: Not tested Oral Motor Oral Motor/Sensory Function Overall Oral Motor/Sensory Function: Mild impairment Facial Symmetry: Abnormal symmetry left Motor Speech Overall Motor Speech: Appears within functional limits for tasks assessed  Care Tool Care Tool Cognition Ability to hear (with hearing aid or hearing appliances if normally used Ability to hear (with hearing aid or hearing appliances if normally used): 1. Minimal difficulty - difficulty in some environments (e.g.  when person speaks softly or setting is noisy)   Expression of Ideas and Wants Expression of Ideas and Wants: 2. Frequent difficulty - frequently exhibits difficulty with expressing needs and ideas   Understanding Verbal and Non-Verbal Content Understanding Verbal and Non-Verbal Content: 2. Sometimes understands - understands only basic conversations or simple, direct phrases. Frequently requires cues to understand  Memory/Recall Ability Memory/Recall Ability : None of the above were recalled   Bedside Swallowing Assessment General Date of Onset: 06/19/21 Previous Swallow Assessment: MBS on 06/20/21 Diet Prior to this Study: Regular;Thin liquids Temperature Spikes Noted: No Respiratory Status: Room air History of Recent Intubation: No Behavior/Cognition: Confused Oral Cavity - Dentition: Adequate natural dentition Self-Feeding Abilities: Needs assist Patient Positioning: Upright in bed Baseline Vocal Quality: Low vocal intensity Volitional  Cough: Cognitively unable to elicit Volitional Swallow: Unable to elicit  Oral Care Assessment Does patient have any of the following "high(er) risk" factors?: None of the above Does patient have any of the following "at risk" factors?: Nutritional status - dependent feeder Patient is AT RISK: Order set for Adult Oral Care Protocol initiated -  "At Risk Patients" option selected (see row information) Patient is LOW RISK: Follow universal precautions (see row information) Ice Chips Ice chips: Not tested Thin Liquid Thin Liquid: Within functional limits Presentation: Straw Nectar Thick Nectar Thick Liquid: Not tested Honey Thick Honey Thick Liquid: Not tested Puree Puree: Not tested Solid Solid: Not tested BSE Assessment Suspected Esophageal Findings Suspected Esophageal Findings:  (hx of esophageal issues) Risk for Aspiration Impact on safety and function: No limitations Other Related Risk Factors: History of esophageal-related issues  Short Term Goals: Week 1: SLP Short Term Goal 1 (Week 1): Patient will consume current diet without overt s/sx of aspiration with min A verbal cues for use of swallowing compensatory strategies SLP Short Term Goal 2 (Week 1): Patient will demonstrate sustained attenion to functional tasks for 3 minutes with mod A verbal cues for redirection SLP Short Term Goal 3 (Week 1): Patient will follow basic one-step directions in 50% of opportunities with max A verbal/visual/modeling cues SLP Short Term Goal 4 (Week 1): Patient will point to objects/pictures in field of 3 with max A verbal/visual cues SLP Short Term Goal 5 (Week 1): Patient will answer biographical/environmental yes/no questions with 50% accuracy with max A multimodal cues  Refer to Care Plan for Long Term Goals  Recommendations for other services: None   Discharge Criteria: Patient will be discharged from SLP if patient refuses treatment 3 consecutive times without medical reason, if  treatment goals not met, if there is a change in medical status, if patient makes no progress towards goals or if patient is discharged from hospital.  The above assessment, treatment plan, treatment alternatives and goals were discussed and mutually agreed upon: by patient  Patty Sermons 06/30/2021, 5:25 PM

## 2021-06-30 NOTE — Plan of Care (Signed)
°  Problem: RH Balance Goal: LTG Patient will maintain dynamic sitting balance (PT) Description: LTG:  Patient will maintain dynamic sitting balance with assistance during mobility activities (PT) Flowsheets (Taken 06/30/2021 1100) LTG: Pt will maintain dynamic sitting balance during mobility activities with:: Supervision/Verbal cueing Goal: LTG Patient will maintain dynamic standing balance (PT) Description: LTG:  Patient will maintain dynamic standing balance with assistance during mobility activities (PT) Flowsheets (Taken 06/30/2021 1100) LTG: Pt will maintain dynamic standing balance during mobility activities with:: Contact Guard/Touching assist   Problem: Sit to Stand Goal: LTG:  Patient will perform sit to stand with assistance level (PT) Description: LTG:  Patient will perform sit to stand with assistance level (PT) Flowsheets (Taken 06/30/2021 1100) LTG: PT will perform sit to stand in preparation for functional mobility with assistance level: Contact Guard/Touching assist   Problem: RH Bed Mobility Goal: LTG Patient will perform bed mobility with assist (PT) Description: LTG: Patient will perform bed mobility with assistance, with/without cues (PT). Flowsheets (Taken 06/30/2021 1100) LTG: Pt will perform bed mobility with assistance level of: Supervision/Verbal cueing   Problem: RH Bed to Chair Transfers Goal: LTG Patient will perform bed/chair transfers w/assist (PT) Description: LTG: Patient will perform bed to chair transfers with assistance (PT). Flowsheets (Taken 06/30/2021 1100) LTG: Pt will perform Bed to Chair Transfers with assistance level: Contact Guard/Touching assist   Problem: RH Car Transfers Goal: LTG Patient will perform car transfers with assist (PT) Description: LTG: Patient will perform car transfers with assistance (PT). Flowsheets (Taken 06/30/2021 1100) LTG: Pt will perform car transfers with assist:: Contact Guard/Touching assist   Problem: RH  Ambulation Goal: LTG Patient will ambulate in controlled environment (PT) Description: LTG: Patient will ambulate in a controlled environment, # of feet with assistance (PT). Flowsheets (Taken 06/30/2021 1100) LTG: Pt will ambulate in controlled environ  assist needed:: Contact Guard/Touching assist LTG: Ambulation distance in controlled environment: 150 Goal: LTG Patient will ambulate in home environment (PT) Description: LTG: Patient will ambulate in home environment, # of feet with assistance (PT). Flowsheets (Taken 06/30/2021 1100) LTG: Pt will ambulate in home environ  assist needed:: Contact Guard/Touching assist LTG: Ambulation distance in home environment: 50   Problem: RH Wheelchair Mobility Goal: LTG Patient will propel w/c in controlled environment (PT) Description: LTG: Patient will propel wheelchair in controlled environment, # of feet with assist (PT) Flowsheets (Taken 06/30/2021 1100) LTG: Pt will propel w/c in controlled environ  assist needed:: Supervision/Verbal cueing LTG: Propel w/c distance in controlled environment: 150 Goal: LTG Patient will propel w/c in home environment (PT) Description: LTG: Patient will propel wheelchair in home environment, # of feet with assistance (PT). Flowsheets (Taken 06/30/2021 1100) Distance: wheelchair distance in controlled environment: 50

## 2021-06-30 NOTE — Progress Notes (Signed)
Inpatient Rehabilitation  Patient information reviewed and entered into eRehab system by Barkley Kratochvil Keonta Alsip, OTR/L.   Information including medical coding, functional ability and quality indicators will be reviewed and updated through discharge.    

## 2021-06-30 NOTE — Evaluation (Signed)
Physical Therapy Assessment and Plan  Patient Details  Name: Devon Elliott. 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[facility])   Past Medical History:  Past Medical History:  Diagnosis Date   Complete tear of left rotator cuff 07/21/2014   Essential hypertension 04/19/2014   Impingement syndrome of left shoulder 07/21/2014   Left hip pain 02/08/2016   Left supraspinatus tenosynovitis 07/21/2014   Pure hypercholesterolemia 04/19/2014   Past Surgical History:  Past Surgical History:  Procedure Laterality Date   ELBOW SURGERY  1995   ESOPHAGOGASTRODUODENOSCOPY N/A 05/01/2020   Procedure: ESOPHAGOGASTRODUODENOSCOPY (EGD);  Surgeon: Toledo, Benay Pike, MD;  Location: [facility] ENDOSCOPY;  Service: Gastroenterology;  Laterality: N/A;   ESOPHAGOGASTRODUODENOSCOPY (EGD) WITH PROPOFOL N/A 05/31/2016   Procedure: ESOPHAGOGASTRODUODENOSCOPY (EGD) WITH PROPOFOL;  Surgeon: Jonathon Bellows, MD;  Location: [facility] ENDOSCOPY;  Service: Endoscopy;  Laterality: N/A;   IR CT HEAD LTD  06/19/2021   IR PERCUTANEOUS ART THROMBECTOMY/INFUSION INTRACRANIAL INC DIAG ANGIO  06/19/2021   IR US GUIDE VASC ACCESS RIGHT  06/19/2021   KYPHOPLASTY N/A 07/30/2019   Procedure: L1 KYPHOPLASTY;  Surgeon: Hessie Knows, MD;  Location: [facility] ORS;  Service: Orthopedics;  Laterality: N/A;   RADIOLOGY WITH ANESTHESIA N/A 06/19/2021   Procedure: IR WITH ANESTHESIA;  Surgeon: Radiologist, Medication, MD;  Location: [place];  Service: Radiology;  Laterality: N/A;    Assessment & Plan Clinical Impression: Patient is a 87 y.o. year old male who began experiencing mild word finding difficulties on the evening of June 18, 2021.  The  following morning, his symptoms worsened and family called EMS.  The patient, at that time was combative, with EMS personnel.  He arrived at [facility] emergency department, assessed and neurology consulted.  Code stroke was activated by the emergency department physician. He was found to have dense global aphasia and was unable to follow any commands.  He remained alert and protecting his airway. He was exhibiting expressive aphasia as well.  He had antigravity in his right leg with some movement against gravity.  CTH NAICP w/ ASPECTS 10.    CT of the head, CTA of head and CT perfusion were completed.  The patient was not a candidate for IV thrombolysis due to LKW of greater than 4.5 hours.  Imaging findings were consistent with acute left MCA M2 occlusion.  Dr. Quinn Axe discussed the findings with the patient and with Dr. Earleen Newport at [facility] neuro interventional radiology.  The patient and his family agreed to transfer to [facility].  Patient and family were counseled regarding interventional revascularization.  Dr. Earleen Newport felt given his profile, he would be a reasonable candidate for attempted mechanical thrombectomy.  This was performed via right common femoral artery access by Dr. Earleen Newport on June 19, 2021 at 3 PM.  He was extubated and admitted to the ICU.  He exhibited postprocedure agitation and restlessness.  He was medicated and noted to have transient hypotension and mild hypoxia.  Cleviprex was tapered off.  Critical care management was consulted; neurology following. MCA infarct felt likely secondary to atrial fibrillation. His hyperactive delirium continued and he was placed on low-dose Precedex infusion.  Palliative care was consulted to establish goals of care.  Remained full code with  full scope of care at that time. Lovenox for DVT prophylaxis.  Heparin infusion started on June 21, 2021. EEG performed: suggestive of cortical dysfunction in right hemisphere as well as mild diffuse  encephalopathy, nonspecific etiology.  No seizures or epileptiform discharges were seen throughout the recording. Heparin infusion discontinued and transitioned to Lovenox 65 mg q 24 hours due to difficulty obtaining labs. Follow-up CT head without acute change. Extracranial carotid arteries <50% stenosis. Able to cooperate with MRI of head June 24, 2021. 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 [name]. Patient is now DNR. The patient requires inpatient medicine and rehabilitation evaluations and services for ongoing dysfunction secondary to left MCA infarct.  Patient currently requires  +2 assist for safety  with mobility secondary to muscle weakness, decreased cardiorespiratoy endurance, decreased motor planning, decreased initiation, decreased attention, decreased awareness, decreased problem solving, decreased safety awareness, decreased memory, and delayed processing, and decreased sitting balance, decreased standing balance, decreased postural control, and decreased balance strategies.  Prior to hospitalization, patient was  unable to confirm PLOF  with mobility and lived with Spouse in a House home.  Home access is  Ramped entrance.  Patient will benefit from skilled PT intervention to maximize safe functional mobility, minimize fall risk, and decrease caregiver burden for planned discharge home with 24 hour assist.  Anticipate patient will benefit from follow up [facility] at discharge.  PT - End of Session Activity Tolerance: Tolerates < 10 min activity, no significant change in vital signs Endurance Deficit: Yes PT Assessment Rehab Potential (ACUTE/IP ONLY): Fair PT Barriers to Discharge: Decreased caregiver support;Lack of/limited family support;Insurance for SNF coverage;Behavior PT Patient demonstrates impairments in the following area(s): Balance;Behavior;Edema;Endurance;Motor;Nutrition;Pain;Perception;Safety;Sensory;Skin Integrity PT  Transfers Functional Problem(s): Bed Mobility;Bed to Chair;Car;Furniture PT Locomotion Functional Problem(s): Ambulation;Wheelchair Mobility PT Plan PT Intensity: Minimum of 1-2 x/day ,45 to 90 minutes PT Frequency: 5 out of 7 days PT Duration Estimated Length of Stay: ~2.5 weeks PT Treatment/Interventions: Ambulation/gait training;Discharge planning;Functional mobility training;Psychosocial support;Therapeutic Activities;Visual/perceptual remediation/compensation;Balance/vestibular training;Neuromuscular re-education;Disease management/prevention;Skin care/wound management;Therapeutic Exercise;Wheelchair propulsion/positioning;Cognitive remediation/compensation;DME/adaptive equipment instruction;Pain management;Splinting/orthotics;UE/LE Strength taining/ROM;Community reintegration;Functional electrical stimulation;Patient/family education;Stair training;UE/LE Coordination activities PT Transfers Anticipated Outcome(s): CGA PT Locomotion Anticipated Outcome(s): CGA PT Recommendation Follow Up Recommendations: Skilled nursing facility;24 hour supervision/assistance;Home health PT Patient destination: [place] (LTC) Equipment Recommended: To be determined   PT Evaluation Precautions/Restrictions Precautions Precautions: Fall;Other (comment) Precaution Comments: SBP<200, aphasia, can be combative Restrictions Weight Bearing Restrictions: No General   Vital Signs Pain Pain Assessment Pain Scale: Faces Faces Pain Scale: Hurts a little bit Pain Type: Acute pain Pain Location: Wrist Pain Orientation: Right Pain Interference Pain Interference Pain Effect on Sleep: 8. Unable to answer Pain Interference with Therapy Activities: 8. Unable to answer Pain Interference with Day-to-Day Activities: 8. Unable to answer Home Living/Prior Functioning Home Living Available Help at Discharge: Available 24 hours/day Type of Home: House Home Access: Ramped entrance Home Layout: One  level Bathroom Shower/Tub: Multimedia programmer: Standard Bathroom Accessibility: Yes Additional Comments: Per PT on acute notes:  aide 3 days, 10-5 really as supervision and to take him out in the community. Pt has hearing aids and per son has always been very energetic and on the go. wife uses a RW and will not be able to physically assist.  Lives With: Spouse Prior Function Level of Independence: Other (comment) (no one present to confirm) Vision/Perception  Vision - History Ability to See in Adequate Light: 0 Adequate Perception Perception: Impaired Praxis Praxis: Impaired  Praxis Impairment Details: Motor planning;Perseveration;Initiation  Cognition Overall Cognitive Status: History of cognitive impairments - at baseline Arousal/Alertness: Awake/alert Orientation Level: Oriented to person (sometimes not responsive to name) Year:  ("I'm just curious") Month:  ("the monthly") Day of Week: Incorrect Sustained Attention: Impaired Memory: Impaired Awareness: Impaired Problem Solving: Impaired Safety/Judgment: Impaired Sensation Sensation Light Touch: Appears Intact Hot/Cold: Not tested Proprioception: Appears Intact Stereognosis: Not tested Coordination Gross Motor Movements are Fluid and Coordinated: No Fine Motor Movements are Fluid and Coordinated: No Coordination and Movement Description: general deconditioning, guarding R UE Motor  Motor Motor: Hemiplegia;Abnormal tone;Abnormal postural alignment and control Motor - Skilled Clinical Observations: generalized weakness, no overt hemiplegia noted   Trunk/Postural Assessment  Cervical Assessment Cervical Assessment: Exceptions to [facility] (forward head) Thoracic Assessment Thoracic Assessment: Exceptions to [facility] (increased kyphosis) Lumbar Assessment Lumbar Assessment: Exceptions to [facility] (posterior pelvic tilt) Postural Control Postural Control: Deficits on evaluation Righting Reactions: delayed and  inadequate Protective Responses: delayed and inadequate  Balance Balance Balance Assessed: Yes Standardized Balance Assessment Standardized Balance Assessment: FIST Function in Sitting Test = FIST Anterior Nudge: superior sternum: Needs assistance Anterior Nudge Assistance: Min Assist Posterior Nudge: between scapular spines: Needs assistance Posterior Nudge Assistance: Min Assist Lateral Nudge: to dominant side at acromion: Needs assistance Lateral Nudge Assistance: Min Assist Static Sitting: 30 seconds: Upper extremity support Sitting, Shake "No": left and right: Upper extremity support Sitting, Eyes Closed: 30 seconds: Upper extremity support Sitting, Lift Foot: dominant side, lift foot 1 inch twice: Upper extremity support Pick Up Object From Behind: object at midline, hands breadth posterior: Needs assistance Pick Up Object From Behind Assistance: Min Assist Forward Reach: use dominant arm, must complete full motion: Dependent Lateral Reach: use dominant arm, clear opposite ischial tuberosity: Dependent Pick Up Object From Floor: from between feet: Dependent Posterior Scooting: move backwards 2 inches: Needs assistance Posterior Scooting Assistance: Max Assist Anterior Scooting: move forward 2 inches: Needs assistance Anterior Scooting Assistance: Max Assist Lateral Scooting: move to dominent side 2 inches: Needs assistance Lateral Scooting Assistance: Max Assist FIST TOTAL SCORE: 15 Static Sitting Balance Static Sitting - Balance Support: Feet supported;Bilateral upper extremity supported Static Sitting - Level of Assistance: 5: Stand by assistance Dynamic Sitting Balance Dynamic Sitting - Balance Support: Feet supported;During functional activity Dynamic Sitting - Level of Assistance: 4: Min assist Static Standing Balance Static Standing - Balance Support: Bilateral upper extremity supported Static Standing - Level of Assistance: 4: Min assist Dynamic Standing  Balance Dynamic Standing - Balance Support: During functional activity Dynamic Standing - Level of Assistance: 3: Mod assist Extremity Assessment      RLE Assessment RLE Assessment: Exceptions to [facility] General Strength Comments: unable to formally assess, but can move against gravity LLE Assessment LLE Assessment: Exceptions to [facility] General Strength Comments: unable to formally assess, but can move against gravity  Care Tool Care Tool Bed Mobility Roll left and right activity   Roll left and right assist level: Minimal Assistance - Patient > 75%    Sit to lying activity   Sit to lying assist level: Minimal Assistance - Patient > 75%    Lying to sitting on side of bed activity   Lying to sitting on side of bed assist level: the ability to move from lying on the back to sitting on the side of the bed with no back support.: Maximal Assistance - Patient 25 - 49%     Care Tool Transfers Sit to stand transfer   Sit to stand assist level:  2 Helpers    Chair/bed transfer   Chair/bed transfer assist level: 2 Armed forces training and education officer transfer activity did not occur: Safety/medical Personal assistant transfer activity did not occur: Environmental limitations        Care Corporate treasurer   Assist level: 2 helpers Assistive device: Walker-rolling Max distance: 20  Walk 10 feet activity   Assist level: 2 helpers Assistive device: Walker-rolling   Walk 50 feet with 2 turns activity Walk 50 feet with 2 turns activity did not occur: Safety/medical concerns      Walk 150 feet activity Walk 150 feet activity did not occur: Safety/medical concerns      Walk 10 feet on uneven surfaces activity Walk 10 feet on uneven surfaces activity did not occur: Safety/medical concerns      Stairs Stair activity did not occur: Safety/medical concerns        Walk up/down 1 step activity Walk up/down 1 step or curb (drop down) activity did not occur:  Safety/medical concerns      Walk up/down 4 steps activity Walk up/down 4 steps activity did not occur: Safety/medical concerns      Walk up/down 12 steps activity Walk up/down 12 steps activity did not occur: Safety/medical concerns      Pick up small objects from floor Pick up small object from the floor (from standing position) activity did not occur: Safety/medical concerns      Wheelchair Is the patient using a wheelchair?: Yes Type of Wheelchair: Manual   Wheelchair assist level: Maximal Assistance - Patient 25 - 49% Max wheelchair distance: 150  Wheel 50 feet with 2 turns activity   Assist Level: Maximal Assistance - Patient 25 - 49%  Wheel 150 feet activity   Assist Level: Maximal Assistance - Patient 25 - 49%    Refer to Care Plan for Long Term Goals  SHORT TERM GOAL WEEK 1 PT Short Term Goal 1 (Week 1): Patient will complete bed mobility with CGA PT Short Term Goal 2 (Week 1): Patient will complete bed <> wc transfers with LRAD and ModA x1 PT Short Term Goal 3 (Week 1): Patient will ambulate >5f with LRAD and MinA x1  Recommendations for other services: None   Skilled Therapeutic Intervention Mobility Bed Mobility Bed Mobility: Rolling Right;Rolling Left;Supine to Sit;Sit to Supine Rolling Right: Minimal Assistance - Patient > 75% Rolling Left: Minimal Assistance - Patient > 75% Supine to Sit: Maximal Assistance - Patient - Patient 25-49% Sit to Supine: Minimal Assistance - Patient > 75% Transfers Transfers: Sit to Stand;Stand Pivot Transfers;Stand to Sit Sit to Stand: 2 Helpers Stand to Sit: 2 HTeacher, musicTransfers: 2 HPress photographer(Assistive device): 2 person hand held aChief Operating OfficerAmbulation: Yes Gait Assistance: 2 Helpers (MinA + wc follow) Gait Distance (Feet): 20 Feet Assistive device: Rolling walker Gait Assistance Details: Verbal cues for safe use of DME/AE;Verbal cues for precautions/safety;Verbal cues for  sequencing Gait Gait: Yes Gait Pattern: Impaired Gait Pattern: Step-through pattern;Decreased step length - right;Decreased step length - left;Poor foot clearance - left;Poor foot clearance - right;Wide base of support;Shuffle;Trunk flexed Gait velocity: decreased High Level Ambulation High Level Ambulation: Backwards walking Backwards Walking: x5 steps to bed (incr difficulty motor planning) Stairs / Additional Locomotion Stairs: No Wheelchair Mobility Wheelchair Mobility: Yes Wheelchair Assistance: Maximal Assistance - Patient 25 - 49% Wheelchair Propulsion: Both upper extremities Wheelchair Parts Management: Needs assistance Distance: 150   Discharge  Criteria: Patient will be discharged from PT if patient refuses treatment 3 consecutive times without medical reason, if treatment goals not met, if there is a change in medical status, if patient makes no progress towards goals or if patient is discharged from hospital.  The above assessment, treatment plan, treatment alternatives and goals were discussed and mutually agreed upon: No family available/patient unable  Debbora Dus 06/30/2021, 10:59 AM

## 2021-06-30 NOTE — Plan of Care (Signed)
°  Problem: RH Swallowing Goal: LTG Patient will consume least restrictive diet using compensatory strategies with assistance (SLP) Description: LTG:  Patient will consume least restrictive diet using compensatory strategies with assistance (SLP) Flowsheets (Taken 06/30/2021 1730) LTG: Pt Patient will consume least restrictive diet using compensatory strategies with assistance of (SLP): Supervision   Problem: RH Comprehension Communication Goal: LTG Patient will comprehend basic/complex auditory (SLP) Description: LTG: Patient will comprehend basic/complex auditory information with cues (SLP). Flowsheets (Taken 06/30/2021 1730) LTG: Patient will comprehend auditory information with cueing (SLP):  Minimal Assistance - Patient > 75%  Moderate Assistance - Patient 50 - 74%   Problem: RH Expression Communication Goal: LTG Patient will express needs/wants via multi-modal(SLP) Description: LTG:  Patient will express needs/wants via multi-modal communication (gestures/written, etc) with cues (SLP) Flowsheets (Taken 06/30/2021 1730) LTG: Patient will express needs/wants via multimodal communication (gestures/written, etc) with cueing (SLP):  Minimal Assistance - Patient > 75%  Moderate Assistance - Patient 50 - 74% Goal: LTG Patient will verbally express basic/complex needs(SLP) Description: LTG:  Patient will verbally express basic/complex needs, wants or ideas with cues  (SLP) Flowsheets (Taken 06/30/2021 1730) LTG: Patient will verbally express basic/complex needs, wants or ideas (SLP):  Minimal Assistance - Patient > 75%  Moderate Assistance - Patient 50 - 74% Goal: LTG Patient will increase word finding of common (SLP) Description: LTG:  Patient will increase word finding of common objects/daily info/abstract thoughts with cues using compensatory strategies (SLP). Flowsheets (Taken 06/30/2021 1730) LTG: Patient will increase word finding of common (SLP):  Minimal Assistance - Patient >  75%  Moderate Assistance - Patient 50 - 74%   Problem: RH Attention Goal: LTG Patient will demonstrate this level of attention during functional activites (SLP) Description: LTG:  Patient will will demonstrate this level of attention during functional activites (SLP) Flowsheets (Taken 06/30/2021 1730) Patient will demonstrate during cognitive/linguistic activities the attention type of: Sustained LTG: Patient will demonstrate this level of attention during cognitive/linguistic activities with assistance of (SLP): Minimal Assistance - Patient > 75%

## 2021-06-30 NOTE — Progress Notes (Signed)
Inpatient Rehabilitation Care Coordinator Assessment and Plan Patient Details  Name: Devon Elliott. 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[facility])  Past Medical History:  Past Medical History:  Diagnosis Date   Complete tear of left rotator cuff 07/21/2014   Essential hypertension 04/19/2014   Impingement syndrome of left shoulder 07/21/2014   Left hip pain 02/08/2016   Left supraspinatus tenosynovitis 07/21/2014   Pure hypercholesterolemia 04/19/2014   Past Surgical History:  Past Surgical History:  Procedure Laterality Date   ELBOW SURGERY  1995   ESOPHAGOGASTRODUODENOSCOPY N/A 05/01/2020   Procedure: ESOPHAGOGASTRODUODENOSCOPY (EGD);  Surgeon: Toledo, Boykin Nearing, MD;  Location: [facility] ENDOSCOPY;  Service: Gastroenterology;  Laterality: N/A;   ESOPHAGOGASTRODUODENOSCOPY (EGD) WITH PROPOFOL N/A 05/31/2016   Procedure: ESOPHAGOGASTRODUODENOSCOPY (EGD) WITH PROPOFOL;  Surgeon: Wyline Mood, MD;  Location: [facility] ENDOSCOPY;  Service: Endoscopy;  Laterality: N/A;   IR CT HEAD LTD  06/19/2021   IR PERCUTANEOUS ART THROMBECTOMY/INFUSION INTRACRANIAL INC DIAG ANGIO  06/19/2021   IR US GUIDE VASC ACCESS RIGHT  06/19/2021   KYPHOPLASTY N/A 07/30/2019   Procedure: L1 KYPHOPLASTY;  Surgeon: Kennedy Bucker, MD;  Location: [facility] ORS;  Service: Orthopedics;  Laterality: N/A;   RADIOLOGY WITH ANESTHESIA N/A 06/19/2021   Procedure: IR WITH ANESTHESIA;  Surgeon: Radiologist, Medication, MD;  Location: [facility] OR;  Service: Radiology;  Laterality: N/A;   Social History:  reports that he has quit smoking. He has never used smokeless tobacco. He reports current alcohol use. He reports that he does not use drugs.  Family / Support Systems Spouse/Significant Other: June Children: Earlene Plater (Sons) Other Supports: Harriet Anticipated Caregiver: June Ability/Limitations of Caregiver: Min A Caregiver Availability: 24/7 Family Dynamics:  support from spouse, son and other family  Social History Preferred language: English Religion: None Health Literacy - How often do you need to have someone help you when you read instructions, pamphlets, or other written material from your doctor or pharmacy?: Often Writes: Yes Employment Status: Retired Marine scientist Issues: n/a Guardian/Conservator: spouse/son   Abuse/Neglect Abuse/Neglect Assessment Can Be Completed: Yes Physical Abuse: Denies Verbal Abuse: Denies Sexual Abuse: Denies Exploitation of patient/patient's resources: Denies Self-Neglect: Denies  Patient response to: Social Isolation - How often do you feel lonely or isolated from those around you?:  (per Nurse documentation, pt unable to respond.)  Emotional Status Pt's affect, behavior and adjustment status: n/a Recent Psychosocial Issues: coping Psychiatric History: n/a Substance Abuse History: n/a  Patient / Family Perceptions, Expectations & Goals Pt/Family understanding of illness & functional limitations: yes Premorbid pt/family roles/activities: Active in the community. Independent & some assistance with cognitive tasks Anticipated changes in roles/activities/participation: Spouse able to provide some assistance Pt/family expectations/goals: Supervision  [employer] Agencies: None Premorbid Home Care/DME Agencies: Other (Comment) (RW, Financial risk analyst) Transportation available at discharge: family Is the patient able to respond to transportation needs?: Yes In the past 12 months, has lack of transportation kept you from medical appointments or from getting medications?: No In the past 12 months, has lack of transportation kept you from meetings, work, or from getting things needed for daily living?: No  Discharge Planning Living Arrangements: Spouse/significant other Support Systems: Spouse/significant other, Children, Other relatives Type of Residence: Private  residence Insurance Resources: Media planner (specify) Passenger transport manager) Surveyor, quantity Resources: Social Security, SSD, Family Support Financial Screen Referred: No Living Expenses: Own Money Management: Spouse, Patient Does the patient have any problems obtaining your medications?:  No Home Management: Independent Patient/Family Preliminary Plans: spouse and other family able to assist Care Coordinator Barriers to Discharge: Insurance for SNF coverage Care Coordinator Anticipated Follow Up Needs: HH/OP Expected length of stay: 12-14 Days  Clinical Impression Goal is for the patient to d/c home with assisting from spouse, son and other family. SW will follow up with patient on next week with updates of progress. No additional questions or concerns.  Andria Rhein 06/30/2021, 11:57 AM

## 2021-06-30 NOTE — Progress Notes (Addendum)
Speech Language Pathology Daily Session Note  Patient Details  Name: Devon Elliott. MRN: [id number] Date of Birth: 12-Jan-1927  Today's Date: 06/30/2021 SLP Individual Time: 1530-1600 SLP Individual Time Calculation (min): 30 min  Short Term Goals: Week 1: SLP Short Term Goal 1 (Week 1): Patient will consume current diet without overt s/sx of aspiration with min A verbal cues for use of swallowing compensatory strategies SLP Short Term Goal 2 (Week 1): Patient will demonstrate sustained attenion to functional tasks for 3 minutes with mod A verbal cues for redirection SLP Short Term Goal 3 (Week 1): Patient will follow basic one-step directions in 50% of opportunities with max A verbal/visual/modeling cues SLP Short Term Goal 4 (Week 1): Patient will point to objects/pictures in field of 3 with max A verbal/visual cues SLP Short Term Goal 5 (Week 1): Patient will answer biographical/environmental yes/no questions with 50% accuracy with max A multimodal cues  Skilled Therapeutic Interventions: Patient's granddaughter was present. Provided education on cogntive-communication and language deficits s/p CVA. Patient appeared in good spirits, was smiling, and expressed "we had a good time." When granddaughter left pt appeared more restless and internally distracted. SLP showed patient a picture that was in room and patient identified his wife by stating "that's my June" and when referring to himself said "her beautiful husband." Pt had difficulty naming other family members but responded yes when asked if they were his grandchildren. Pt was unable to follow basic one-step commands and perform automatic speech tasks. He didn't appear to understand task instructions and required max A verbal redirection for attention to present task. Pt was left in bed with alarm activated and needs within reach. Continue current POC.  Pain  No discomfort/[ain behaviors demonstrated.  Therapy/Group: Individual  Therapy  Tamala Ser 06/30/2021, 5:39 PM

## 2021-06-30 NOTE — Evaluation (Signed)
Occupational Therapy Assessment and Plan  Patient Details  Name: Devon Elliott. 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[facility])   Past Medical History:  Past Medical History:  Diagnosis Date   Complete tear of left rotator cuff 07/21/2014   Essential hypertension 04/19/2014   Impingement syndrome of left shoulder 07/21/2014   Left hip pain 02/08/2016   Left supraspinatus tenosynovitis 07/21/2014   Pure hypercholesterolemia 04/19/2014   Past Surgical History:  Past Surgical History:  Procedure Laterality Date   ELBOW SURGERY  1995   ESOPHAGOGASTRODUODENOSCOPY N/A 05/01/2020   Procedure: ESOPHAGOGASTRODUODENOSCOPY (EGD);  Surgeon: Toledo, Benay Pike, MD;  Location: [facility] ENDOSCOPY;  Service: Gastroenterology;  Laterality: N/A;   ESOPHAGOGASTRODUODENOSCOPY (EGD) WITH PROPOFOL N/A 05/31/2016   Procedure: ESOPHAGOGASTRODUODENOSCOPY (EGD) WITH PROPOFOL;  Surgeon: Jonathon Bellows, MD;  Location: [facility] ENDOSCOPY;  Service: Endoscopy;  Laterality: N/A;   IR CT HEAD LTD  06/19/2021   IR PERCUTANEOUS ART THROMBECTOMY/INFUSION INTRACRANIAL INC DIAG ANGIO  06/19/2021   IR US GUIDE VASC ACCESS RIGHT  06/19/2021   KYPHOPLASTY N/A 07/30/2019   Procedure: L1 KYPHOPLASTY;  Surgeon: Hessie Knows, MD;  Location: [facility] ORS;  Service: Orthopedics;  Laterality: N/A;   RADIOLOGY WITH ANESTHESIA N/A 06/19/2021   Procedure: IR WITH ANESTHESIA;  Surgeon: Radiologist, Medication, MD;  Location: [facility];  Service: Radiology;  Laterality: N/A;    Assessment & Plan Clinical Impression: Devon Elliott. Devon Elliott is a 87 year old male who began experiencing mild word finding  difficulties on the evening of June 18, 2021.  The following morning, his symptoms worsened and family called EMS.  The patient, at that time was combative, with EMS personnel.  He arrived at [facility] emergency department, assessed and neurology consulted.  Code stroke was activated by the emergency department physician. He was found to have dense global aphasia and was unable to follow any commands.  He remained alert and protecting his airway. He was exhibiting expressive aphasia as well.  He had antigravity in his right leg with some movement against gravity.  CTH NAICP w/ ASPECTS 10.    CT of the head, CTA of head and CT perfusion were completed.  The patient was not a candidate for IV thrombolysis due to LKW of greater than 4.5 hours.  Imaging findings were consistent with acute left MCA M2 occlusion.  Dr. Quinn Axe discussed the findings with the patient and with Dr. Earleen Newport at [facility] neuro interventional radiology.  The patient and his family agreed to transfer to [facility].  Patient and family were counseled regarding interventional revascularization.  Dr. Earleen Newport felt given his profile, he would be a reasonable candidate for attempted mechanical thrombectomy.  This was performed via right common femoral artery access by Dr. Earleen Newport on June 19, 2021 at 3 PM.  He was extubated and admitted to the ICU.  He exhibited postprocedure agitation and restlessness.  He was medicated and noted to have transient hypotension and mild hypoxia.  Cleviprex was tapered off.  Critical care management was consulted; neurology following. MCA infarct felt likely secondary to atrial fibrillation. His hyperactive delirium continued and he was placed on low-dose Precedex infusion.  Palliative care was consulted to establish goals of care.  Remained full code with full scope of care at that time. Lovenox for DVT prophylaxis.  Heparin infusion started on June 21, 2021. EEG performed: suggestive of cortical  dysfunction in right hemisphere as well as mild diffuse encephalopathy, nonspecific etiology.  No seizures or epileptiform discharges were seen throughout the recording. Heparin infusion discontinued and transitioned to Lovenox 65 mg q 24 hours due to difficulty obtaining labs. Follow-up CT head without acute change. Extracranial carotid arteries <50% stenosis. Able to cooperate with MRI of head June 24, 2021. 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 [name]. Patient is now DNR. The patient requires inpatient medicine and rehabilitation evaluations and services for ongoing dysfunction secondary to left MCA infarct.   2D echocardiogram performed June 20, 2021 with estimated EF of 45%.   The patient lives at home with his wife, June.  She states the patient walked with a walker at baseline and had no baseline speech or cognitive deficits prior to current event.  Performs majority of his ADLs.  Wears hearing aids. Patient on minimal medications at home.     His past medical history is significant for hypertension on beta-blocker. History of hyperlipidemia, no antilipid therapy PTA.  Remote history of atrial fibrillation not on anticoagulation PTA. Wife reports: "very mild dementia". Patient transferred to CIR on 06/29/2021 .    Patient currently requires max with basic self-care skills secondary to muscle weakness and muscle joint tightness, decreased cardiorespiratoy endurance, decreased coordination, decreased motor planning, decreased awareness, decreased problem solving, decreased safety awareness, decreased memory, and delayed processing, and decreased sitting balance, decreased standing balance, decreased postural control, hemiplegia, and decreased balance strategies.  Prior to hospitalization, patient could complete BADLs with modified independent .  Patient will benefit from skilled intervention to increase independence with basic self-care  skills prior to discharge home with care partner.  Anticipate patient will require 24 hour supervision and minimal physical assistance and follow up home health.  OT - End of Session Endurance Deficit: Yes OT Assessment Rehab Potential (ACUTE ONLY): Fair OT Patient demonstrates impairments in the following area(s): Balance;Cognition;Behavior;Pain;Motor;Endurance;Perception;Safety;Sensory OT Basic ADL's Functional Problem(s): Grooming;Bathing;Dressing;Toileting OT Transfers Functional Problem(s): Toilet;Tub/Shower OT Additional Impairment(s): Fuctional Use of Upper Extremity OT Plan OT Intensity: Minimum of 1-2 x/day, 45 to 90 minutes OT Frequency: 5 out of 7 days OT Duration/Estimated Length of Stay: 19-21 days OT Treatment/Interventions: Balance/vestibular training;Cognitive remediation/compensation;Discharge planning;DME/adaptive equipment instruction;Functional mobility training;Neuromuscular re-education;Pain management;Patient/family education;Self Care/advanced ADL retraining;Psychosocial support;Therapeutic Activities;Therapeutic Exercise;UE/LE Strength taining/ROM;UE/LE Coordination activities;Visual/perceptual remediation/compensation OT Self Feeding Anticipated Outcome(s): independent OT Basic Self-Care Anticipated Outcome(s): min A OT Toileting Anticipated Outcome(s): min A OT Bathroom Transfers Anticipated Outcome(s): min A OT Recommendation Patient destination: Home Follow Up Recommendations: Home health OT Equipment Recommended: 3 in 1 bedside comode;To be determined   OT Evaluation Precautions/Restrictions  Precautions Precautions: Fall;Other (comment) Precaution Comments: SBP<200, aphasia, can be combative Restrictions Weight Bearing Restrictions: No Therapy Vitals Temp: 98.9 F (37.2 C) Pulse Rate: 60 Resp: 18 BP: (!) 148/115 Patient Position (if appropriate): Lying Oxygen Therapy SpO2: 96 % O2 Device: Room Air Pain Pain Assessment Pain Scale: Faces Faces  Pain Scale: Hurts a little bit Pain Type: Acute pain Pain Location: Wrist Pain Orientation: Right Home Living/Prior Functioning Home Living Living Arrangements: Spouse/significant other Available Help at Discharge: Available 24 hours/day Type of Home: House Home Access: Ramped entrance Home Layout: One level Bathroom Shower/Tub: Multimedia programmer: Standard Bathroom Accessibility: Yes Additional Comments: Per PT on acute notes:  aide  3 days, 10-5 really as supervision and to take him out in the community. Pt has hearing aids and per son has always been very energetic and on the go. wife uses a RW and will not be able to physically assist.  Lives With: Spouse Prior Function Level of Independence: Other (comment) (no one present to confirm) Vision Baseline Vision/History: 0 No visual deficits Ability to See in Adequate Light: 0 Adequate Patient Visual Report: Other (comment) (patient unable to answer) Vision Assessment?: Vision impaired- to be further tested in functional context Perception  Perception: Impaired Praxis Praxis: Impaired Praxis Impairment Details: Motor planning;Perseveration;Initiation Cognition Overall Cognitive Status: History of cognitive impairments - at baseline Arousal/Alertness: Awake/alert Orientation Level: Person Year:  ("I'm just curious") Month:  ("the monthly") Day of Week: Incorrect Memory: Impaired Immediate Memory Recall:  (unable to repeat the words, kept saying "soft") Memory Recall Sock: Not able to recall Memory Recall Blue: Not able to recall Memory Recall Bed: Not able to recall Sustained Attention: Impaired Awareness: Impaired Problem Solving: Impaired Safety/Judgment: Impaired Sensation Sensation Light Touch: Appears Intact Hot/Cold: Not tested Proprioception: Appears Intact Stereognosis: Not tested Coordination Gross Motor Movements are Fluid and Coordinated: No Fine Motor Movements are Fluid and Coordinated:  No Coordination and Movement Description: general deconditioning, guarding R UE Motor  Motor Motor: Hemiplegia;Abnormal tone;Abnormal postural alignment and control Motor - Skilled Clinical Observations: generalized weakness, no overt hemiplegia noted  Trunk/Postural Assessment  Cervical Assessment Cervical Assessment: Exceptions to [facility] (forward head) Thoracic Assessment Thoracic Assessment: Exceptions to [facility] (increased kyphosis) Lumbar Assessment Lumbar Assessment: Exceptions to [facility] (posterior pelvic tilt) Postural Control Postural Control: Deficits on evaluation Righting Reactions: delayed and inadequate Protective Responses: delayed and inadequate  Balance Balance Balance Assessed: Yes Standardized Balance Assessment Standardized Balance Assessment: FIST Function in Sitting Test = FIST Anterior Nudge: superior sternum: Needs assistance Anterior Nudge Assistance: Min Assist Posterior Nudge: between scapular spines: Needs assistance Posterior Nudge Assistance: Min Assist Lateral Nudge: to dominant side at acromion: Needs assistance Lateral Nudge Assistance: Min Assist Static Sitting: 30 seconds: Upper extremity support Sitting, Shake "No": left and right: Upper extremity support Sitting, Eyes Closed: 30 seconds: Upper extremity support Sitting, Lift Foot: dominant side, lift foot 1 inch twice: Upper extremity support Pick Up Object From Behind: object at midline, hands breadth posterior: Needs assistance Pick Up Object From Behind Assistance: Min Assist Forward Reach: use dominant arm, must complete full motion: Dependent Lateral Reach: use dominant arm, clear opposite ischial tuberosity: Dependent Pick Up Object From Floor: from between feet: Dependent Posterior Scooting: move backwards 2 inches: Needs assistance Posterior Scooting Assistance: Max Assist Anterior Scooting: move forward 2 inches: Needs assistance Anterior Scooting Assistance: Max Assist Lateral Scooting:  move to dominent side 2 inches: Needs assistance Lateral Scooting Assistance: Max Assist FIST TOTAL SCORE: 15 Static Sitting Balance Static Sitting - Balance Support: Feet supported;Bilateral upper extremity supported Static Sitting - Level of Assistance: 5: Stand by assistance Dynamic Sitting Balance Dynamic Sitting - Balance Support: Feet supported;During functional activity Dynamic Sitting - Level of Assistance: 4: Min assist Static Standing Balance Static Standing - Balance Support: Bilateral upper extremity supported Static Standing - Level of Assistance: 4: Min assist Dynamic Standing Balance Dynamic Standing - Balance Support: During functional activity Dynamic Standing - Level of Assistance: 3: Mod assist Extremity/Trunk Assessment RUE Assessment Passive Range of Motion (PROM) Comments: pt resisted me touching him to assess PROM Active Range of Motion (AROM) Comments: Per observation, pt lifted shoulder to 120 flexion only General Strength  Comments: Pt unable to do MMT, c/o R wrist pain and grimaced when squeezing hand LUE Assessment Passive Range of Motion (PROM) Comments: pt resisted me touching him to assess PROM Active Range of Motion (AROM) Comments: per observation, WNL General Strength Comments: unable to do MMT, appears to be 4/5  Care Tool Care Tool Self Care Eating   Eating Assist Level: Minimal Assistance - Patient > 75%    Oral Care    Oral Care Assist Level: Minimal Assistance - Patient > 75%    Bathing   Body parts bathed by patient: Chest;Abdomen;Face Body parts bathed by helper: Right arm;Left arm;Front perineal area;Buttocks;Left lower leg;Right lower leg;Left upper leg;Right upper leg   Assist Level: Maximal Assistance - Patient 24 - 49%    Upper Body Dressing(including orthotics)   What is the patient wearing?: Pull over shirt   Assist Level: Maximal Assistance - Patient 25 - 49%    Lower Body Dressing (excluding footwear)   What is the patient  wearing?: Pants;Incontinence brief Assist for lower body dressing: Total Assistance - Patient < 25%    Putting on/Taking off footwear   What is the patient wearing?: Non-skid slipper socks Assist for footwear: Total Assistance - Patient < 25%       Care Tool Toileting Toileting activity   Assist for toileting: 2 Helpers     Care Tool Bed Mobility Roll left and right activity   Roll left and right assist level: Minimal Assistance - Patient > 75%    Sit to lying activity   Sit to lying assist level: Minimal Assistance - Patient > 75%    Lying to sitting on side of bed activity   Lying to sitting on side of bed assist level: the ability to move from lying on the back to sitting on the side of the bed with no back support.: Maximal Assistance - Patient 25 - 49%     Care Tool Transfers Sit to stand transfer   Sit to stand assist level: 2 Helpers    Chair/bed transfer   Chair/bed transfer assist level: 2 Armed forces training and education officer transfer activity did not occur: Safety/medical concerns Assist Level: 2 Helpers     Care Tool Cognition  Expression of Ideas and Wants Expression of Ideas and Wants: 2. Frequent difficulty - frequently exhibits difficulty with expressing needs and ideas  Understanding Verbal and Non-Verbal Content Understanding Verbal and Non-Verbal Content: 2. Sometimes understands - understands only basic conversations or simple, direct phrases. Frequently requires cues to understand   Memory/Recall Ability Memory/Recall Ability : None of the above were recalled   Refer to Care Plan for Long Term Goals  SHORT TERM GOAL WEEK 1 OT Short Term Goal 1 (Week 1): Pt will actively use RUE during self feeding with mod cues. OT Short Term Goal 2 (Week 1): Pt will demonstrate improved praxis to bathe with min A. OT Short Term Goal 3 (Week 1): Pt will don shirt with min A. OT Short Term Goal 4 (Week 1): pt will sit to stand with min A to prepare for transfers. OT  Short Term Goal 5 (Week 1): pt will complete BSC transfers with mod A of 1.  Recommendations for other services: None    Skilled Therapeutic Intervention ADL ADL Grooming: Minimal assistance Upper Body Bathing: Moderate assistance Where Assessed-Upper Body Bathing: Edge of bed Lower Body Bathing: Moderate assistance Where Assessed-Lower Body Bathing: Edge of bed Upper Body Dressing: Maximal assistance Where Assessed-Upper Body  Dressing: Edge of bed Lower Body Dressing: Maximal assistance Where Assessed-Lower Body Dressing: Edge of bed Toileting: Unable to assess Toilet Transfer: Moderate assistance (+2 for safety) Toilet Transfer Method: Stand pivot Toilet Transfer Equipment: Bedside commode Mobility  Bed Mobility Bed Mobility: Rolling Right;Rolling Left;Supine to Sit;Sit to Supine Rolling Right: Minimal Assistance - Patient > 75% Rolling Left: Minimal Assistance - Patient > 75% Supine to Sit: Maximal Assistance - Patient - Patient 25-49% Sit to Supine: Minimal Assistance - Patient > 75% Transfers Sit to Stand: 2 Helpers Stand to Sit: 2 Helpers  Pt seen for initial evaluation, no family present.  Pt has aphasia but is also very disoriented telling me he is 87 years old and could not identify he was in the hospital. Once he is told where and why he is here, he is able to repeat the information but also perseverates on the words.  Pt initially fearful and guarding his arms. He continually repeated "I am in pain, no touch!"  It took some time building rapport and reassuring pt I am here to help him. Used a great deal of encouraging language to allow me to assist pt with moving in bed, bathing and dressing. His NT arrived to be a +2 for safety with sit to stand and transfers. He is actually able to move fairly well but due to decreased awareness he is a high fall risk. At one pt began to sit down before transfer completed.  Pt very focused on painful R wrist saying repeatedly it was from  tennis.  Informed MD.  At end of session, pt resting in bed with all needs met and alarm set.   Discharge Criteria: Patient will be discharged from OT if patient refuses treatment 3 consecutive times without medical reason, if treatment goals not met, if there is a change in medical status, if patient makes no progress towards goals or if patient is discharged from hospital.  The above assessment, treatment plan, treatment alternatives and goals were discussed and mutually agreed upon: No family available/patient unable  [facility] 06/30/2021, 1:46 PM

## 2021-06-30 NOTE — Progress Notes (Signed)
Inpatient Rehabilitation Admission Medication Review by a Pharmacist  A complete drug regimen review was completed for this patient to identify any potential clinically significant medication issues.  High Risk Drug Classes Is patient taking? Indication by Medication  Antipsychotic Yes Compazine n/v Seroquel/depakote agitation  Anticoagulant Yes Apixaban - afib/cva  Antibiotic No   Opioid No   Antiplatelet No   Hypoglycemics/insulin No   Vasoactive Medication Yes Metoprolol - htn  Chemotherapy No   Other Yes Memantine - dementia Atorvastatin - hld Protonix - gerd      Type of Medication Issue Identified Description of Issue Recommendation(s)  Drug Interaction(s) (clinically significant)     Duplicate Therapy     Allergy     No Medication Administration End Date     Incorrect Dose     Additional Drug Therapy Needed  Cholecalciferol 25 MCG (1000 UT) Follow up with Wendi Maya in [place]  Significant med changes from prior encounter (inform family/care partners about these prior to discharge).    Other       Clinically significant medication issues were identified that warrant physician communication and completion of prescribed/recommended actions by midnight of the next day:  Yes  Name of provider notified for urgent issues identified: Wendi Maya, PA  Provider Method of Notification: Secure chat    Pharmacist comments: Adden: 12/16: Vit D resumed. No further f/u  Time spent performing this drug regimen review (minutes):  20  Courtany Mcmurphy S. Merilynn Finland, PharmD, BCPS Clinical Staff Pharmacist [email or web address] 06/30/2021 10:23 AM

## 2021-06-30 NOTE — Progress Notes (Signed)
Inpatient Rehabilitation Center Individual Statement of Services  Patient Name:  Devon Elliott.  Date:  06/30/2021  Welcome to the Inpatient Rehabilitation Center.  Our goal is to provide you with an individualized program based on your diagnosis and situation, designed to meet your specific needs.  With this comprehensive rehabilitation program, you will be expected to participate in at least 3 hours of rehabilitation therapies Monday-Friday, with modified therapy programming on the weekends.  Your rehabilitation program will include the following services:  Physical Therapy (PT), Occupational Therapy (OT), Speech Therapy (ST), 24 hour per day rehabilitation nursing, Therapeutic Recreaction (TR), Neuropsychology, Care Coordinator, Rehabilitation Medicine, Nutrition Services, Pharmacy Services, and Other  Weekly team conferences will be held on Wednesdays  to discuss your progress.  Your Inpatient Rehabilitation Care Coordinator will talk with you frequently to get your input and to update you on team discussions.  Team conferences with you and your family in attendance may also be held.  Expected length of stay:  12-14 Days  Overall anticipated outcome:  Supervision  Depending on your progress and recovery, your program may change. Your Inpatient Rehabilitation Care Coordinator will coordinate services and will keep you informed of any changes. Your Inpatient Rehabilitation Care Coordinator's name and contact numbers are listed  below.  The following services may also be recommended but are not provided by the Inpatient Rehabilitation Center:   Home Health Rehabiltiation Services Outpatient Rehabilitation Services    Arrangements will be made to provide these services after discharge if needed.  Arrangements include referral to agencies that provide these services.  Your insurance has been verified to be:  [employer] Your primary doctor is:  Bethann Punches, MD  Pertinent  information will be shared with your doctor and your insurance company.  Inpatient Rehabilitation Care Coordinator:  Lavera Guise, [place] [phone] or [phone]  Information discussed with and copy given to patient by: Andria Rhein, 06/30/2021, 12:04 PM

## 2021-06-30 NOTE — IPOC Note (Addendum)
Overall Plan of Care (IPOC) Patient Details Name: Devon Elliott. 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[facility])     Functional Problem List: Nursing Bladder, Medication Management, Safety, Bowel, Endurance  PT Balance, Behavior, Edema, Endurance, Motor, Nutrition, Pain, Perception, Safety, Sensory, Skin Integrity  OT Balance, Cognition, Behavior, Pain, Motor, Endurance, Perception, Safety, Sensory  SLP Cognition, Behavior, Linguistic, Safety  TR         Basic ADLs: OT Grooming, Bathing, Dressing, Toileting     Advanced  ADLs: OT       Transfers: PT Bed Mobility, Bed to Chair, Car, Occupational psychologist, Research scientist (life sciences): PT Ambulation, Psychologist, prison and probation services     Additional Impairments: OT Fuctional Use of Upper Extremity  SLP Swallowing, Communication, Social Cognition expression, comprehension Problem Solving, Awareness, Attention  TR      Anticipated Outcomes Item Anticipated Outcome  Self Feeding independent  Swallowing  sup A and least restrictive diet   Basic self-care  min A  Toileting  min A   Bathroom Transfers min A  Bowel/Bladder  manage bowel w mod I assist and bladder with min assist  Transfers  CGA  Locomotion  CGA  Communication  min A basic  Cognition  min A basic  Pain  n/a  Safety/Judgment  maintain safety w cues   Therapy Plan: PT Intensity: Minimum of 1-2 x/day ,45 to 90 minutes PT Frequency: 5 out of 7 days PT Duration Estimated Length of Stay: ~2.5 weeks OT Intensity: Minimum of 1-2 x/day, 45 to 90 minutes OT Frequency: 5 out of 7 days OT Duration/Estimated Length of Stay: 19-21 days SLP Intensity: Minumum of 1-2 x/day, 30 to 90 minutes SLP Frequency: 3 to 5 out of 7 days SLP Duration/Estimated Length of Stay: 2.5-3 weeks   Due to the current state of emergency, patients may not be receiving their 3-hours of  Medicare-mandated therapy.   Team Interventions: Nursing Interventions Bladder Management, Disease Management/Prevention, Medication Management, Discharge Planning, Dysphagia/Aspiration Precaution Training, Bowel Management, Patient/Family Education  PT interventions Ambulation/gait training, Discharge planning, Functional mobility training, Psychosocial support, Therapeutic Activities, Visual/perceptual remediation/compensation, Balance/vestibular training, Neuromuscular re-education, Disease management/prevention, Skin care/wound management, Therapeutic Exercise, Wheelchair propulsion/positioning, Cognitive remediation/compensation, DME/adaptive equipment instruction, Pain management, Splinting/orthotics, UE/LE Strength taining/ROM, Community reintegration, Development worker, international aid stimulation, Patient/family education, Museum/gallery curator, UE/LE Coordination activities  OT Interventions Warden/ranger, Cognitive remediation/compensation, Discharge planning, DME/adaptive equipment instruction, Functional mobility training, Neuromuscular re-education, Pain management, Patient/family education, Self Care/advanced ADL retraining, Psychosocial support, Therapeutic Activities, Therapeutic Exercise, UE/LE Strength taining/ROM, UE/LE Coordination activities, Visual/perceptual remediation/compensation  SLP Interventions Cognitive remediation/compensation, Environmental controls, Internal/external aids, Speech/Language facilitation, Cueing hierarchy, Multimodal communication approach, Dysphagia/aspiration precaution training, Functional tasks, Patient/family education, Therapeutic Activities  TR Interventions    SW/CM Interventions Discharge Planning, Psychosocial Support, Patient/Family Education, Disease Management/Prevention   Barriers to Discharge MD  Medical stability  Nursing Decreased caregiver support, Incontinence 1 level ramped entry w spouse;aide 3 days, 10-5 really as supervision and to take  him out in the community. Pt has hearing aids and per son has always been very energetic and on the go. wife uses a RW and will not be able to physically assist  PT Decreased caregiver support, Lack of/limited family support, Insurance for SNF coverage, Behavior    OT      SLP      SW Insurance for SNF coverage     Team Discharge Planning: Destination:  PT-Long Term Care (LTC) ,OT- Home , SLP-Home Projected Follow-up: PT-Skilled nursing facility, 24 hour supervision/assistance, Home health PT, OT-  Home health OT, SLP-24 hour supervision/assistance Projected Equipment Needs: PT-To be determined, OT- 3 in 1 bedside comode, To be determined, SLP-None recommended by SLP Equipment Details: PT- , OT-  Patient/family involved in discharge planning: PT- Patient unable/family or caregiver not available,  OT-Patient unable/family or caregiver not available, SLP-Patient unable/family or caregive not available  MD ELOS: 10 to 14 days Medical Rehab Prognosis:  Good Assessment: 87 year old male who began experiencing mild word finding difficulties on the evening of June 18, 2021.  The following morning, his symptoms worsened and family called EMS.  The patient, at that time was combative, with EMS personnel.  He arrived at [facility] emergency department, assessed and neurology consulted.  Code stroke was activated by the emergency department physician. He was found to have dense global aphasia and was unable to follow any commands.  He remained alert and protecting his airway. He was exhibiting expressive aphasia as well.  He had antigravity in his right leg with some movement against gravity.  CTH NAICP w/ ASPECTS 10.    CT of the head, CTA of head and CT perfusion were completed.  The patient was not a candidate for IV thrombolysis due to LKW of greater than 4.5 hours.  Imaging findings were consistent with acute left MCA M2 occlusion.  Dr. Quinn Axe discussed the findings with the patient and with  Dr. Earleen Newport at [facility] neuro interventional radiology.  The patient and his family agreed to transfer to [facility].  Patient and family were counseled regarding interventional revascularization.  Dr. Earleen Newport felt given his profile, he would be a reasonable candidate for attempted mechanical thrombectomy.  This was performed via right common femoral artery access by Dr. Earleen Newport on June 19, 2021 at 3 PM.  He was extubated and admitted to the ICU.  He exhibited postprocedure agitation and restlessness.  He was medicated and noted to have transient hypotension and mild hypoxia.  Cleviprex was tapered off.  Critical care management was consulted; neurology following. MCA infarct felt likely secondary to atrial fibrillation. His hyperactive delirium continued and he was placed on low-dose Precedex infusion.  Palliative care was consulted to establish goals of care.  Remained full code with full scope of care at that time. Lovenox for DVT prophylaxis.  Heparin infusion started on June 21, 2021. EEG performed: suggestive of cortical dysfunction in right hemisphere as well as mild diffuse encephalopathy, nonspecific etiology.  No seizures or epileptiform discharges were seen throughout the recording. Heparin infusion discontinued and transitioned to Lovenox 65 mg q 24 hours due to difficulty obtaining labs. Follow-up CT head without acute change. Extracranial carotid arteries <50% stenosis. Able to cooperate with MRI of head June 24, 2021. 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 [name]. Patient is now DNR. The patient requires inpatient medicine and rehabilitation evaluations and services for ongoing dysfunction secondary to left MCA infarct.   2D echocardiogram performed June 20, 2021 with estimated EF of 45%.    See Team Conference Notes for weekly updates to the plan of care

## 2021-07-01 NOTE — Progress Notes (Signed)
Speech Language Pathology Daily Session Note  Patient Details  Name: Devon Elliott. MRN: [id number] Date of Birth: 1927/06/14  Today's Date: 07/01/2021 SLP Individual Time: 1435-1520 SLP Individual Time Calculation (min): 45 min and Today's Date: 07/01/2021 SLP Missed Time: 10 Minutes Missed Time Reason: Patient fatigue  Short Term Goals: Week 1: SLP Short Term Goal 1 (Week 1): Patient will consume current diet without overt s/sx of aspiration with min A verbal cues for use of swallowing compensatory strategies SLP Short Term Goal 2 (Week 1): Patient will demonstrate sustained attenion to functional tasks for 3 minutes with mod A verbal cues for redirection SLP Short Term Goal 3 (Week 1): Patient will follow basic one-step directions in 50% of opportunities with max A verbal/visual/modeling cues SLP Short Term Goal 4 (Week 1): Patient will point to objects/pictures in field of 3 with max A verbal/visual cues SLP Short Term Goal 5 (Week 1): Patient will answer biographical/environmental yes/no questions with 50% accuracy with max A multimodal cues  Skilled Therapeutic Interventions: Pt seen for skilled ST with focus on communication goals, pt granddaughter having just left from visiting. Pt in bed calmly watching basketball, in pleasant mood. Initially, pt able to communicate biographical information with 50% accuracy mod A cues including age "59", DOB "31-Dec-1926" and recalling granddaughters name. Pt incorrectly stating wife's name is "Toniann Fail" and he lives on "[address] in [place]", does recognize correct responses when SLP provides them. Pt spontaneous answers to basic questions appear to have the most accuracy, when asked questions again or patient thought about same question for a while, he would start to produce answers that were incorrect (stated he was 87 years old after correct response given few seconds earlier). Pt able to follow 1-step directions <25% of opportunities  despite max A multimodal cues. Pt focused attention to therapeutic tasks 1-2 minutes with mod verbal and visual A. As session continued, pt becoming more aphasic with fatigue impacting communication and ability to understand questions/prompts. Pt eventually falling asleep, missing final 10 minutes of ST session. Pt left in bed sleeping with alarm set and all needs within reach. Cont ST POC.  Pain Pain Assessment Pain Scale: 0-10 Pain Score: 0-No pain  Therapy/Group: Individual Therapy  Tacey Ruiz 07/01/2021, 3:06 PM

## 2021-07-01 NOTE — Progress Notes (Signed)
Physical Therapy Session Note  Patient Details  Name: Devon Elliott. MRN: [id number] Date of Birth: August 10, 1926  Today's Date: 07/01/2021 PT Individual Time: 0805-0900 PT Individual Time Calculation (min): 55 min   Short Term Goals: Week 1:  PT Short Term Goal 1 (Week 1): Patient will complete bed mobility with CGA PT Short Term Goal 2 (Week 1): Patient will complete bed <> wc transfers with LRAD and ModA x1 PT Short Term Goal 3 (Week 1): Patient will ambulate >50f with LRAD and MinA x1  Skilled Therapeutic Interventions/Progress Updates:   Pt received supine in bed with NT present, and pt agreeable to PT. NT reporting that pt having trouble with eating breakfast due to partial activation. Supine>sit transfer with mod assist  at trunk and then to perform scoot to EOB to allow stand. [facility] mod A nad +2 present for safety to assist with AD management.  PT assisted pt with completion of meal. Pt required moderate cues for processing, proper use of utensils, awareness of food and drink types. Pt noted to drink syrup several times, regardless of instruction from PT. Pt left sitting in WC with call bell in reach and all needs met.       Therapy Documentation Precautions:  Precautions Precautions: Fall, Other (comment) Precaution Comments: SBP<200, aphasia, can be combative Restrictions Weight Bearing Restrictions: No  Pain:  denies    Therapy/Group: Individual Therapy  ALorie Phenix12/17/2022, 12:57 PM

## 2021-07-01 NOTE — Progress Notes (Signed)
PROGRESS NOTE   Subjective/Complaints: Pt aphasic  Hand xray apprecaite , pt has pain but no erythema or synovitis in MCP or PIP or DIPs  WBCs normal  No fevers   ROS- aphasic  Objective:   DG Hand 2 View Right  Result Date: 06/30/2021 CLINICAL DATA:  Pain EXAM: RIGHT HAND - 2 VIEW COMPARISON:  None. FINDINGS: There is deformity in the head of fourth metacarpal with multiple radiolucencies and erosive changes in the cortical margins. No definite recent fracture is seen. There is small focus of erosive change in the cortical margin in the base of proximal phalanx of the fourth finger. Bony spurs are noted in the interphalangeal joint of right thumb. Possible small subcortical cyst is seen in the head of the third metacarpal. IMPRESSION: No recent fracture or dislocation is seen. There is deformity of head of the right fourth metacarpal with subcortical lucencies and possible disruption of cortical margins. There is possible small erosive change in the base of proximal phalanx of fourth finger close to the fourth metacarpophalangeal joint. Findings may suggest acute or chronic arthritis or septic arthritis. Follow-up three-phase bone scan or MRI as clinically warranted should be considered. Electronically Signed   By: Elmer Picker M.D.   On: 06/30/2021 13:56   Recent Labs    06/30/21 0505  WBC 9.9  HGB 11.5*  HCT 35.0*  PLT 231   Recent Labs    06/29/21 0456 06/30/21 0505  NA 142 143  K 4.1 4.0  CL 109 109  CO2 24 25  GLUCOSE 115* 113*  BUN 26* 24*  CREATININE 1.29* 1.05  CALCIUM 8.5* 8.6*    Intake/Output Summary (Last 24 hours) at 07/01/2021 1008 Last data filed at 07/01/2021 0914 Gross per 24 hour  Intake 820 ml  Output 1600 ml  Net -780 ml        Physical Exam: Vital Signs Blood pressure (!) 118/106, pulse 81, temperature (!) 97.5 F (36.4 C), temperature source Oral, resp. rate 18, weight 67.1 kg,  SpO2 95 %.   General: No acute distress Mood and affect are appropriate Heart: Regular rate and rhythm no rubs murmurs or extra sounds Lungs: Clear to auscultation, breathing unlabored, no rales or wheezes Abdomen: Positive bowel sounds, soft nontender to palpation, nondistended Extremities: No clubbing, cyanosis, or edema Skin: No evidence of breakdown, no evidence of rash Neurologic: Cranial nerves II through XII intact, motor strength is 5/5 in left and 3- RIght deltoid, bicep, tricep, grip, 5/5 bilateral hip flexor, knee extensors, ankle dorsiflexor and plantar flexor Sensory exam cannot assess due to aphasia   Musculoskeletal: pain with RIght MCP compression , no eythema or swelling, chronic appearing arthritic changes in both hands    Assessment/Plan: 1. Functional deficits which require 3+ hours per day of interdisciplinary therapy in a comprehensive inpatient rehab setting. Physiatrist is providing close team supervision and 24 hour management of active medical problems listed below. Physiatrist and rehab team continue to assess barriers to discharge/monitor patient progress toward functional and medical goals  Care Tool:  Bathing    Body parts bathed by patient: Chest, Abdomen, Face   Body parts bathed by helper: Right arm, Left  arm, Front perineal area, Buttocks, Left lower leg, Right lower leg, Left upper leg, Right upper leg     Bathing assist Assist Level: Maximal Assistance - Patient 24 - 49%     Upper Body Dressing/Undressing Upper body dressing   What is the patient wearing?: Pull over shirt    Upper body assist Assist Level: Maximal Assistance - Patient 25 - 49%    Lower Body Dressing/Undressing Lower body dressing      What is the patient wearing?: Pants, Incontinence brief     Lower body assist Assist for lower body dressing: Total Assistance - Patient < 25%     Toileting Toileting    Toileting assist Assist for toileting: 2 Helpers      Transfers Chair/bed transfer  Transfers assist     Chair/bed transfer assist level: 2 Helpers     Locomotion Ambulation   Ambulation assist      Assist level: 2 helpers Assistive device: Walker-rolling Max distance: 20   Walk 10 feet activity   Assist     Assist level: 2 helpers Assistive device: Walker-rolling   Walk 50 feet activity   Assist Walk 50 feet with 2 turns activity did not occur: Safety/medical concerns         Walk 150 feet activity   Assist Walk 150 feet activity did not occur: Safety/medical concerns         Walk 10 feet on uneven surface  activity   Assist Walk 10 feet on uneven surfaces activity did not occur: Safety/medical concerns         Wheelchair     Assist Is the patient using a wheelchair?: Yes Type of Wheelchair: Manual    Wheelchair assist level: Maximal Assistance - Patient 25 - 49% Max wheelchair distance: 150    Wheelchair 50 feet with 2 turns activity    Assist        Assist Level: Maximal Assistance - Patient 25 - 49%   Wheelchair 150 feet activity     Assist      Assist Level: Maximal Assistance - Patient 25 - 49%   Blood pressure (!) 118/106, pulse 81, temperature (!) 97.5 F (36.4 C), temperature source Oral, resp. rate 18, weight 67.1 kg, SpO2 95 %.  Medical Problem List and Plan: 1. Functional deficits secondary to left MCA infarct likely secondary to embolism Righ themiparesis and moderate global aphasia , suspect apraxia as well              -patient may  shower             -ELOS/Goals: 12-14d 2.  Antithrombotics: -DVT/anticoagulation:  Pharmaceutical: Other (comment)  apixaban 5 mg BID             -antiplatelet therapy: none 3. Pain Management: Tylenol Add diclofenac gel to RIght hand  4. Mood: Depakote,              -antipsychotic agents: Seroquel 25 mg daily and q HS, Seroquel 25 mg q 6 hours PRN agitation;  Namenda 5. Neuropsych: This patient is not capable of  making decisions on his own behalf. 6. Skin/Wound Care: routine skin checks 7. Fluids/Electrolytes/Nutrition: routine ins and outs with follow-up chemistries. Hypokalemia resolved. 8. Atrial fibrillation: rate controlled. Metoprolol 25 mg BID, Eliquis 5 mg daily 9. Hypertension: goal 0000000 systolic BP post thrombectomy. Continue metoprolol. Amlodipine added 12/13 Vitals:   06/30/21 1928 07/01/21 0433  BP: 137/86 (!) 118/106  Pulse: 95 81  Resp: 18 18  Temp: 97.8 F (36.6 C) (!) 97.5 F (36.4 C)  SpO2: 98% 95%  Elevated diastolic x 1 , monitor   10: Hyperlipidemia: LDL =  110; goal <70. Started on high intensity statin: atorvastatin 40 mg. Continue at discharge. 11: Delirium with history of mild dementia. Continue Depakote, Seroquel and Namenda Reduced comprehension associated with receptive language deficits are contributing  12: HFrEF: currently euvolemic 13: AKI: resolved. Recheck serum creatinine 14: Urinary retention: Foley cath placed 12/9. Will discontinue in AM. Orders in place for bladder scanning, PVR check and in/out cath if necessary    LOS: 2 days A FACE TO FACE EVALUATION WAS PERFORMED  Erick Colace 07/01/2021, 10:08 AM

## 2021-07-01 NOTE — Progress Notes (Signed)
Occupational Therapy Session Note  Patient Details  Name: Devon Elliott. MRN: [id number] Date of Birth: [birth year]  Today's Date: 07/01/2021 OT Individual Time: 8786-7672 OT Individual Time Calculation (min): 70 min    Short Term Goals: Week 1:  OT Short Term Goal 1 (Week 1): Pt will actively use RUE during self feeding with mod cues. OT Short Term Goal 2 (Week 1): Pt will demonstrate improved praxis to bathe with min A. OT Short Term Goal 3 (Week 1): Pt will don shirt with min A. OT Short Term Goal 4 (Week 1): pt will sit to stand with min A to prepare for transfers. OT Short Term Goal 5 (Week 1): pt will complete BSC transfers with mod A of 1.  Skilled Therapeutic Interventions/Progress Updates:    Pt received asleep side-lying in bed, awakened to multimodal cuing and, agreeable to therapy. Session focus on self-care retraining, activity tolerance, R inattention in prep for improved ADL/IADL/func mobility performance + decreased caregiver burden. Lunch tray then delivered late. Pt initially agreeable to get to w/c to eat lunch, but required extensive time to become consistently alert despite using bed features and tactile cuing. Unable to initiate sitting EOB due to patient not participating in transfer. +2 to scoot up in bed and reposition. Pt's granddaughter [name] present pt more alert / agreeable to eat lunch with her encouragement. Self-fed breakfast with L hand with overall S and assist to open items/cut up food + extensive amount of time. Attempted to use built up handles on utensils to facilitate grasp due to initial difficulty using nondominant hand, but pt took them off and expressed preference to not use them. Cues throughout for swallowing strategies per SLP swallowing sheet. Noted R inattention, requiring therapist to spin plate around to notice all food on plate.  Pt left semi-reclined in bed with granddaughter present with bed alarm engaged, call bell in reach, and all  immediate needs met.    Therapy Documentation Precautions:  Precautions Precautions: Fall, Other (comment) Precaution Comments: SBP<200, aphasia, can be combative Restrictions Weight Bearing Restrictions: No  Pain:   Ongoing R wrist pain, unable to rate but guarding throughout session ADL: See Care Tool for more details.   Therapy/Group: Individual Therapy  Volanda Napoleon MS, OTR/L  07/01/2021, 2:24 PM

## 2021-07-02 NOTE — Progress Notes (Signed)
Orthopedic Tech Progress Note Patient Details:  Devon Elliott February 27, 1927 [id number]  Ortho Devices Type of Ortho Device: Velcro wrist forearm splint Ortho Device/Splint Location: RUE Ortho Device/Splint Interventions: Ordered, Application, Adjustment   Post Interventions Patient Tolerated: Fair  Darleen Crocker 07/02/2021, 3:03 PM

## 2021-07-03 DIAGNOSIS — I69320 Aphasia following cerebral infarction: Secondary | ICD-10-CM

## 2021-07-03 DIAGNOSIS — R339 Retention of urine, unspecified: Secondary | ICD-10-CM

## 2021-07-03 DIAGNOSIS — R7989 Other specified abnormal findings of blood chemistry: Secondary | ICD-10-CM

## 2021-07-03 LAB — COMPREHENSIVE METABOLIC PANEL
ALT: 95 U/L — ABNORMAL HIGH (ref 0–44)
AST: 118 U/L — ABNORMAL HIGH (ref 15–41)
Albumin: 2 g/dL — ABNORMAL LOW (ref 3.5–5.0)
Alkaline Phosphatase: 60 U/L (ref 38–126)
Anion gap: 9 (ref 5–15)
BUN: 28 mg/dL — ABNORMAL HIGH (ref 8–23)
CO2: 24 mmol/L (ref 22–32)
Calcium: 8.1 mg/dL — ABNORMAL LOW (ref 8.9–10.3)
Chloride: 104 mmol/L (ref 98–111)
Creatinine, Ser: 1.41 mg/dL — ABNORMAL HIGH (ref 0.61–1.24)
GFR, Estimated: 47 mL/min — ABNORMAL LOW (ref >60)
Glucose, Bld: 120 mg/dL — ABNORMAL HIGH (ref 70–99)
Potassium: 3.8 mmol/L (ref 3.5–5.1)
Sodium: 137 mmol/L (ref 135–145)
Total Bilirubin: 0.7 mg/dL (ref 0.3–1.2)
Total Protein: 5.3 g/dL — ABNORMAL LOW (ref 6.5–8.1)

## 2021-07-03 LAB — CBC
HCT: 32.3 % — ABNORMAL LOW (ref 39.0–52.0)
Hemoglobin: 10.4 g/dL — ABNORMAL LOW (ref 13.0–17.0)
MCH: 29.2 pg (ref 26.0–34.0)
MCHC: 32.2 g/dL (ref 30.0–36.0)
MCV: 90.7 fL (ref 80.0–100.0)
Platelets: 292 10*3/uL (ref 150–400)
RBC: 3.56 MIL/uL — ABNORMAL LOW (ref 4.22–5.81)
RDW: 14.6 % (ref 11.5–15.5)
WBC: 12.9 10*3/uL — ABNORMAL HIGH (ref 4.0–10.5)
nRBC: 0 % (ref 0.0–0.2)

## 2021-07-03 LAB — URINALYSIS, COMPLETE (UACMP) WITH MICROSCOPIC
Bilirubin Urine: NEGATIVE
Glucose, UA: NEGATIVE mg/dL
Ketones, ur: NEGATIVE mg/dL
Nitrite: NEGATIVE
Protein, ur: 100 mg/dL — AB
RBC / HPF: >50 RBC/hpf (ref 0–5)
Specific Gravity, Urine: 1.025 (ref 1.005–1.030)
WBC, UA: >50 WBC/hpf (ref 0–5)
pH: 5.5 (ref 5.0–8.0)

## 2021-07-03 MED ORDER — TAMSULOSIN HCL 0.4 MG PO CAPS
0.4000 mg | ORAL_CAPSULE | Freq: Every day | ORAL | Status: DC
  Administered 2021-07-03 – 2021-07-12 (×10): 0.4 mg via ORAL
  Filled 2021-07-03 (×10): qty 1

## 2021-07-03 NOTE — Progress Notes (Signed)
Occupational Therapy Session Note  Patient Details  Name: Devon Elliott. MRN: [id number] Date of Birth: May 16, 1927  Today's Date: 07/03/2021 OT Individual Time: 0900-1001 OT Individual Time Calculation (min): 61 min    Short Term Goals: Week 1:  OT Short Term Goal 1 (Week 1): Pt will actively use RUE during self feeding with mod cues. OT Short Term Goal 2 (Week 1): Pt will demonstrate improved praxis to bathe with min A. OT Short Term Goal 3 (Week 1): Pt will don shirt with min A. OT Short Term Goal 4 (Week 1): pt will sit to stand with min A to prepare for transfers. OT Short Term Goal 5 (Week 1): pt will complete BSC transfers with mod A of 1.  Skilled Therapeutic Interventions/Progress Updates:    Pt in bed just finishing eating with NT to start session.  He was not oriented to place as when asked, he could not state.  He also could not pick correctly when given three choices such as bank, church, or hospital.  Decreased ability to consistently follow one step commands.  He was able to transfer to the EOB with max assist secondary to increased fear and decreased ability to actively participate in movement.  Once sitting, he worked on bathing tasks.  Max demonstrational cueing needed to sequence as he would continually keep washing his face.  Therapist would have to physically give him hand over hand guidance to place the his hand on his chest to begin washing.  He demonstrated decreased understanding as he would resist at times with in ability to put sentences together appropriately to answer or respond to therapist.  Max assist was needed for donning pullover shirt with max assist to stand and take 2 small steps up toward the top of the bed in order to lay back down.  Mod assist for transfer to supine with mod assist and max demonstrational cueing to sequence rolling in order to remove brief, wash front and back peri area, and donn new brief.  Total assist for all aspects of LB dressing  in supine.  He was left in bed at completion of dressing tasks to rest prior to next session with PT.  Call button and phone in reach with nursing present as well.    Therapy Documentation Precautions:  Precautions Precautions: Fall, Other (comment) Precaution Comments: SBP<200, aphasia, can be combative Restrictions Weight Bearing Restrictions: No  Pain: Pain Assessment Pain Scale: 0-10 Pain Score: Asleep ADL: See Care Tool Section for some details of mobility and selfcare   Therapy/Group: Individual Therapy  Makaya Juneau OTR/L 07/03/2021, 2:42 PM

## 2021-07-03 NOTE — Progress Notes (Addendum)
PROGRESS NOTE   Subjective/Complaints: Pt eating breakfast with NT. No issues per nursing  ROS: limited due to language/communication    Objective:   No results found. Recent Labs    07/03/21 0545  WBC 12.9*  HGB 10.4*  HCT 32.3*  PLT 292   Recent Labs    07/03/21 0545  NA 137  K 3.8  CL 104  CO2 24  GLUCOSE 120*  BUN 28*  CREATININE 1.41*  CALCIUM 8.1*    Intake/Output Summary (Last 24 hours) at 07/03/2021 1208 Last data filed at 07/03/2021 1008 Gross per 24 hour  Intake 480 ml  Output 975 ml  Net -495 ml        Physical Exam: Vital Signs Blood pressure (!) 149/80, pulse 88, temperature 98.8 F (37.1 C), temperature source Oral, resp. rate 16, weight 67.1 kg, SpO2 96 %.   Constitutional: No distress . Vital signs reviewed. HEENT: NCAT, EOMI, oral membranes moist Neck: supple Cardiovascular: RRR without murmur. No JVD    Respiratory/Chest: CTA Bilaterally without wheezes or rales. Normal effort    GI/Abdomen: BS +, non-tender, non-distended Ext: no clubbing, cyanosis, or edema Psych: pleasant and cooperative  Skin: No evidence of breakdown, no evidence of rash Neurologic: aphasic, follows 25% or less of simple one step commands. Cranial nerves II through XII intact, motor strength is 5/5 in left and 3- RIght deltoid, bicep, tricep, grip, 5/5 bilateral hip flexor, knee extensors, ankle dorsiflexor and plantar flexor Musculoskeletal: pain with RIght MCP compression , no eythema or swelling, chronic appearing arthritic changes in both hands    Assessment/Plan: 1. Functional deficits which require 3+ hours per day of interdisciplinary therapy in a comprehensive inpatient rehab setting. Physiatrist is providing close team supervision and 24 hour management of active medical problems listed below. Physiatrist and rehab team continue to assess barriers to discharge/monitor patient progress toward  functional and medical goals  Care Tool:  Bathing    Body parts bathed by patient: Chest, Abdomen, Face   Body parts bathed by helper: Right arm, Left arm, Front perineal area, Buttocks, Left lower leg, Right lower leg, Left upper leg, Right upper leg     Bathing assist Assist Level: Maximal Assistance - Patient 24 - 49%     Upper Body Dressing/Undressing Upper body dressing   What is the patient wearing?: Pull over shirt    Upper body assist Assist Level: Maximal Assistance - Patient 25 - 49%    Lower Body Dressing/Undressing Lower body dressing      What is the patient wearing?: Pants, Incontinence brief     Lower body assist Assist for lower body dressing: Total Assistance - Patient < 25%     Toileting Toileting    Toileting assist Assist for toileting: 2 Helpers     Transfers Chair/bed transfer  Transfers assist     Chair/bed transfer assist level: 2 Helpers     Locomotion Ambulation   Ambulation assist      Assist level: 2 helpers Assistive device: Walker-rolling Max distance: 20   Walk 10 feet activity   Assist     Assist level: 2 helpers Assistive device: Walker-rolling   Walk 50 feet  activity   Assist Walk 50 feet with 2 turns activity did not occur: Safety/medical concerns         Walk 150 feet activity   Assist Walk 150 feet activity did not occur: Safety/medical concerns         Walk 10 feet on uneven surface  activity   Assist Walk 10 feet on uneven surfaces activity did not occur: Safety/medical concerns         Wheelchair     Assist Is the patient using a wheelchair?: Yes Type of Wheelchair: Manual    Wheelchair assist level: Maximal Assistance - Patient 25 - 49% Max wheelchair distance: 150    Wheelchair 50 feet with 2 turns activity    Assist        Assist Level: Maximal Assistance - Patient 25 - 49%   Wheelchair 150 feet activity     Assist      Assist Level: Maximal  Assistance - Patient 25 - 49%   Blood pressure (!) 149/80, pulse 88, temperature 98.8 F (37.1 C), temperature source Oral, resp. rate 16, weight 67.1 kg, SpO2 96 %.  Medical Problem List and Plan: 1. Functional deficits secondary to left MCA infarct likely secondary to embolism Righ themiparesis and moderate global aphasia , suspect apraxia as well              -patient may  shower             -ELOS/Goals: 12-14d  --Continue CIR therapies including PT, OT, and SLP  2.  Antithrombotics: -DVT/anticoagulation:  Pharmaceutical: Other (comment)  apixaban 5 mg BID             -antiplatelet therapy: none 3. Pain Management: Tylenol Added diclofenac gel to RIght hand  4. Mood: Depakote,              -antipsychotic agents: Seroquel 25 mg daily and q HS, Seroquel 25 mg q 6 hours PRN agitation;  Namenda 5. Neuropsych: This patient is not capable of making decisions on his own behalf. 6. Skin/Wound Care: routine skin checks 7. Fluids/Electrolytes/Nutrition: routine ins and outs with follow-up chemistries. Hypokalemia resolved. 8. Atrial fibrillation: rate controlled. Metoprolol 25 mg BID, Eliquis 5 mg daily 9. Hypertension: goal 0000000 systolic BP post thrombectomy. Continue metoprolol. Amlodipine added 12/13 Vitals:   07/03/21 0523 07/03/21 0849  BP: 136/84 (!) 149/80  Pulse: 91 88  Resp: 16   Temp: 98.8 F (37.1 C)   SpO2: 96%   12/19 improved bp control  10: Hyperlipidemia: LDL =  110; goal <70. Started on high intensity statin: atorvastatin 40 mg. Continue at discharge. 11: Delirium with history of mild dementia. Continue Depakote, Seroquel and Namenda Reduced comprehension associated with receptive language deficits are contributing  12: HFrEF: currently euvolemic 13: AKI: resolved. BUN/Cr trending back up  -encourage fluids  -recheck labs 12/21 14: Urinary retention: Foley cath placed 12/9. Will discontinue in AM. Orders in place for bladder scanning, PVR check and in/out cath if  necessary  -blood clots in urine with caths d/t eliquis  -add flomax, bp can tolerate 15. Leukocytosis  12.9 on 12/19  -afebrile  -check U/A UCX. Consider CXR   LOS: 4 days A FACE TO FACE EVALUATION WAS PERFORMED  Meredith Staggers 07/03/2021, 12:08 PM

## 2021-07-03 NOTE — Progress Notes (Addendum)
Speech Language Pathology Daily Session Note  Patient Details  Name: Devon Elliott. MRN: [id number] Date of Birth: [birth year]  Today's Date: 07/03/2021 SLP Individual Time: 1500-1550 SLP Individual Time Calculation (min): 50 min  Short Term Goals: Week 1: SLP Short Term Goal 1 (Week 1): Patient will consume current diet without overt s/sx of aspiration with min A verbal cues for use of swallowing compensatory strategies SLP Short Term Goal 2 (Week 1): Patient will demonstrate sustained attenion to functional tasks for 3 minutes with mod A verbal cues for redirection SLP Short Term Goal 3 (Week 1): Patient will follow basic one-step directions in 50% of opportunities with max A verbal/visual/modeling cues SLP Short Term Goal 4 (Week 1): Patient will point to objects/pictures in field of 3 with max A verbal/visual cues SLP Short Term Goal 5 (Week 1): Patient will answer biographical/environmental yes/no questions with 50% accuracy with max A multimodal cues  Skilled Therapeutic Interventions: Skilled ST treatment focused on cognitive-communication goals. SLP facilitated session by providing max A for sustained attention and answering basic questions related to holiday card from patient's granddaughter. Patient read card with min A verbal cues for accuracy, though card was perceived as somewhat difficult to read per SLP as writing was abstract with unusual spelling. Similar to initial evaluation, patient exhibited non-fluent speech with word finding difficulty, semantic/phonemic paraphasias, and neologisms with what appeared to be minimal awareness or ability to self correct. Pt appeared pleasantly confused throughout session and acted as though he thought therapist was one of his family members. Session was briefly interrupted by by nursing needs however SLP spent additional time with patient therefore therapist obtained full scheduled time with patient. Patient was left in bed with alarm  activated and immediate needs within reach at end of session. Continue per current plan of care.      Pain Pain Assessment Pain Scale: 0-10 Pain Score: Asleep  Therapy/Group: Individual Therapy  Tamala Ser 07/03/2021, 3:23 PM

## 2021-07-03 NOTE — Progress Notes (Signed)
Physical Therapy Session Note  Patient Details  Name: Devon Elliott. MRN: [id number] Date of Birth: [birth year]  Today's Date: 07/03/2021 PT Individual Time: 1015-1040 PT Individual Time Calculation (min): 25 min   Short Term Goals: Week 1:  PT Short Term Goal 1 (Week 1): Patient will complete bed mobility with CGA PT Short Term Goal 2 (Week 1): Patient will complete bed <> wc transfers with LRAD and ModA x1 PT Short Term Goal 3 (Week 1): Patient will ambulate >72ft with LRAD and MinA x1  Skilled Therapeutic Interventions/Progress Updates:   Received pt supine in bed asleep, upon wakening pt c/o pain in R arm but difficulty communicating due to aphasia - RN notified. Session with emphasis on functional mobility, standing balance/tolerance, and improved endurance with activity. Pt rolled L with mod A and max multimodal cues and transferred L sidelying<>sitting EOB with max A. Pt with difficulty maintaining trunk extension, flexing forward and resting elbows on knees. Pt also with increased difficulty scooting to EOB requiring max A. Sit<>stand with RW and heavy mod A of 1 but pulling up with BUE support on RW. Attempted to side step L towards HOB but pt unable to motor plan/sequence due to apraxia and aphasia. Returned to sitting and pt began trying to lie back down. Sit<>supine with light mod A and scooted to [facility] with total A and use of Trendelenburg bed position. Attempted bed level therex, but pt with difficulty understanding cues for SLR despite therapist providing active assist and tactile cues. Concluded session with pt semi-reclined in bed, needs within reach, and bed alarm on. RUE supported on pillow for edema management and comfort.   Therapy Documentation Precautions:  Precautions Precautions: Fall, Other (comment) Precaution Comments: SBP<200, aphasia, can be combative Restrictions Weight Bearing Restrictions: No  Therapy/Group: Individual Therapy Martin Majestic  PT, DPT   07/03/2021, 7:17 AM

## 2021-07-03 NOTE — Progress Notes (Signed)
Physical Therapy Session Note  Patient Details  Name: Devon Elliott. MRN: [id number] Date of Birth: 1926/08/14  Today's Date: 07/03/2021 PT Individual Time: 7342-8768 PT Individual Time Calculation (min): 16 min   Short Term Goals: Week 1:  PT Short Term Goal 1 (Week 1): Patient will complete bed mobility with CGA PT Short Term Goal 2 (Week 1): Patient will complete bed <> wc transfers with LRAD and ModA x1 PT Short Term Goal 3 (Week 1): Patient will ambulate >89ft with LRAD and MinA x1  Skilled Therapeutic Interventions/Progress Updates:    Patient received supine in bed, asleep, initially difficult to wake. Upon waking, he denied pain. PT attempting to encourage patient to participate in scheduled therapy. Patient not oriented to place, situation or time and sparsely oriented to self. PT gently trying to reorient patient. Patient kindly replying "no thank you" anytime PT offered to assist patient and encourage OOB mobility. PT attempting to gently facilitate movement, but patient would more harshly say "no thank you!" Patient remaining in bed, bed alarm on, call light within reach, RN aware.   Therapy Documentation Precautions:  Precautions Precautions: Fall, Other (comment) Precaution Comments: SBP<200, aphasia, can be combative Restrictions Weight Bearing Restrictions: No    Therapy/Group: Individual Therapy  Elizebeth Koller, PT, DPT, CBIS  07/03/2021, 7:32 AM

## 2021-07-03 NOTE — Progress Notes (Signed)
Patient ID: Devon Elliott., male   DOB: 1926-10-01, 87 y.o.   MRN: [id number] Met with the patient to introduce self and review role. Patient is pleasantly confused with word finding difficulties. Unable to answer questions at times and follow information provided; plan to follow up with wife. Information left at bedside for secondary risk management tips and dietary modification recommendations. Continue to follow along to discharge to address needs and collaborate with the team to facilitate preparation for discharge. Margarito Liner

## 2021-07-04 LAB — CBC WITH DIFFERENTIAL/PLATELET
Abs Immature Granulocytes: 0.12 10*3/uL — ABNORMAL HIGH (ref 0.00–0.07)
Basophils Absolute: 0.1 10*3/uL (ref 0.0–0.1)
Basophils Relative: 1 %
Eosinophils Absolute: 0.1 10*3/uL (ref 0.0–0.5)
Eosinophils Relative: 1 %
HCT: 35.7 % — ABNORMAL LOW (ref 39.0–52.0)
Hemoglobin: 11.8 g/dL — ABNORMAL LOW (ref 13.0–17.0)
Immature Granulocytes: 1 %
Lymphocytes Relative: 7 %
Lymphs Abs: 0.8 10*3/uL (ref 0.7–4.0)
MCH: 30 pg (ref 26.0–34.0)
MCHC: 33.1 g/dL (ref 30.0–36.0)
MCV: 90.8 fL (ref 80.0–100.0)
Monocytes Absolute: 1 10*3/uL (ref 0.1–1.0)
Monocytes Relative: 8 %
Neutro Abs: 9.9 10*3/uL — ABNORMAL HIGH (ref 1.7–7.7)
Neutrophils Relative %: 82 %
Platelets: 314 10*3/uL (ref 150–400)
RBC: 3.93 MIL/uL — ABNORMAL LOW (ref 4.22–5.81)
RDW: 14.6 % (ref 11.5–15.5)
WBC: 12 10*3/uL — ABNORMAL HIGH (ref 4.0–10.5)
nRBC: 0 % (ref 0.0–0.2)

## 2021-07-04 LAB — URIC ACID: Uric Acid, Serum: 8.2 mg/dL (ref 3.7–8.6)

## 2021-07-04 LAB — SEDIMENTATION RATE: Sed Rate: 95 mm/hr — ABNORMAL HIGH (ref 0–16)

## 2021-07-04 LAB — C-REACTIVE PROTEIN: CRP: 14.9 mg/dL — ABNORMAL HIGH (ref <1.0)

## 2021-07-04 MED ORDER — BETHANECHOL CHLORIDE 10 MG PO TABS
10.0000 mg | ORAL_TABLET | Freq: Three times a day (TID) | ORAL | Status: DC
  Administered 2021-07-04 – 2021-07-17 (×36): 10 mg via ORAL
  Filled 2021-07-04 (×38): qty 1

## 2021-07-04 MED ORDER — CEPHALEXIN 250 MG PO CAPS
500.0000 mg | ORAL_CAPSULE | Freq: Two times a day (BID) | ORAL | Status: AC
  Administered 2021-07-04 – 2021-07-09 (×10): 500 mg via ORAL
  Filled 2021-07-04 (×10): qty 2

## 2021-07-04 NOTE — Progress Notes (Signed)
PHARMACY NOTE:  ANTIMICROBIAL RENAL DOSAGE ADJUSTMENT  Current antimicrobial regimen includes a mismatch between antimicrobial dosage and estimated renal function.  As per policy approved by the Pharmacy & Therapeutics and Medical Executive Committees, the antimicrobial dosage will be adjusted accordingly.  Current antimicrobial dosage:  cephalexin 500mg  Q8 hr  Indication: cellulitis   Renal Function:  Estimated Creatinine Clearance: 31.7 mL/min (A) (by C-G formula based on SCr of 1.41 mg/dL (H)).      Antimicrobial dosage has been changed to:  Q12 hr   Additional comments: borderline renal function with worsening Scr in 87 year old -equation is likely overestimating true renal function    Thank you for allowing pharmacy to be a part of this patient's care.  99, PharmD, BCPS, BCCP Clinical Pharmacist  Please check AMION for all [facility] Pharmacy phone numbers After 10:00 PM, call Main Pharmacy [phone]

## 2021-07-04 NOTE — Progress Notes (Addendum)
Speech Language Pathology Daily Session Note  Patient Details  Name: Devon Elliott. MRN: [id number] Date of Birth: 10-15-1926  Today's Date: 07/04/2021 SLP Individual Time: 6712-4580 SLP Individual Time Calculation (min): 35 min and Today's Date: 07/04/2021 SLP Missed Time: 10 Minutes Missed Time Reason: Other (Comment) (Scheduling conflict)  Short Term Goals: Week 1: SLP Short Term Goal 1 (Week 1): Patient will consume current diet without overt s/sx of aspiration with min A verbal cues for use of swallowing compensatory strategies SLP Short Term Goal 2 (Week 1): Patient will demonstrate sustained attenion to functional tasks for 3 minutes with mod A verbal cues for redirection SLP Short Term Goal 3 (Week 1): Patient will follow basic one-step directions in 50% of opportunities with max A verbal/visual/modeling cues SLP Short Term Goal 4 (Week 1): Patient will point to objects/pictures in field of 3 with max A verbal/visual cues SLP Short Term Goal 5 (Week 1): Patient will answer biographical/environmental yes/no questions with 50% accuracy with max A multimodal cues  Skilled Therapeutic Interventions: Skilled ST treatment focused on swallowing and cognitive-communication goals. Pt was passed off by OT where he was consuming morning meal consisting of dysphagia 3 textures and thin liquids with timely and effective mastication & oral prep, oral clearance, and no overt s/sx of aspiration with all assessed consistencies. Pt consumed meal with sup A verbal cues for swallow safety including reducing talking with food in oral cavity. Continue current diet at this time and full supervision for cognitive factors. Following breakfast, patient exhibited significant difficulty attending to basic tasks for greater than 1 minute duration. Patient was initially agreeable to sorting cards based on number (1-5) in which he performed with moderate verbal and visual cues for organization, sustained, and  alternating attention, but eventually expressed "I don't want to" repeatedly and pushed cards away. Pt was agreeable to looking at pictures in room in which he verbally described basic details with non-fluent speech characteristic of semantic and phonemic paraphsias, circumlocution, and neologisms. He was able to share his spouse's name and where they were in a particular picture. Pt was transferred to bed with +2 assist at end of session where he required min A verbal and visual cues for following commands to safely execute transfer. Patient was left in bed with alarm activated and immediate needs within reach at end of session. Continue per current plan of care.      Pain  No pain  Therapy/Group: Individual Therapy  Tamala Ser 07/04/2021, 2:13 PM

## 2021-07-04 NOTE — Progress Notes (Signed)
Physical Therapy Session Note  Patient Details  Name: Devon Elliott. MRN: [id number] Date of Birth: 1926/12/11  Today's Date: 07/04/2021 PT Individual Time: 7124-5809 PT Individual Time Calculation (min): 53 min   Short Term Goals: Week 1:  PT Short Term Goal 1 (Week 1): Patient will complete bed mobility with CGA PT Short Term Goal 2 (Week 1): Patient will complete bed <> wc transfers with LRAD and ModA x1 PT Short Term Goal 3 (Week 1): Patient will ambulate >43ft with LRAD and MinA x1  Skilled Therapeutic Interventions/Progress Updates:    Pt received supine in bed having worked his way down towards the foot of the bed and appearing restless and eager to get OOB. Pt agreeable to therapy session. Pt is hyper-verbal throughout session with global aphasia (expressive more impaired than receptive) although is able to state more automatic phrases - requires calming redirection to attend to a task. Supine>sitting L EOB, HOB slightly elevated and using bedrail, with light mod assist for trunk upright - pt appears very fearful of falling and requires very slow assistance to avoid a startle response with verbal cuing and reassurance. Requires sitting EOB ~68minutes to gain confidence in this position prior to progressing OOB to attempting R stand pivot to w/c.   Sit>stand EOB>B UE support on w/c armrest; however, once stood up pt still holding onto w/c and appears fearful and is not able to motor plan or initiate turning to sit in w/c therefore with gentle, calm cuing encouraged pt to sit back on EOB. When pt becomes fearful he starts to become agitated requiring gentle, calming verbal assurance and cuing as pt does not respond well to tactile cuing as this increases pt agitation. Therapist retrieved RW to have BUE support during transfer to decrease fear of falling.  When initiating sit>stand EOB>RW pt requires significant reassurance to use RW as pt initially starts to push it away and does not  appear to understand the purpose of it - with gentle encouragement to stand on 3 pt able to break through some of these motor planning barriers and initiate coming to stand,  light mod assist for lifting (again with very slow assist so as not to startle patient). Now with B UE support on RW pt is able to initiate stepping towards w/c once thearpist provides manual facilitation to move AD in correct direction - requires mod assist primarily for turning/managing AD and then for balance with therapist gently providing manual facilitation for weight shifting in order to initiate a step.  Transported to gym in w/c to retreive +2 assist and for a quiet environment. Once in gym, pt becomes slightly distressed appearing unsure or nervous about this new environment - requires ~27minutes to settle into this new environment with therapist providing reassuring verbal responses to his aphasic phrases, therapist intermittently able to understand bits of what he was saying.  Sit>stand w/c>RW requiring ~3-20minutes to finally stand as pt continues to be hyperverbal and appear fearful but with gentle redirection and repeated cuing to initiate coming to stand pt able to rise with light mod assist. Gait 33ft using RW with light mod assist of 1 primarily for AD management (prevent it from going too far forward) and balance with +2 w/c follow - pt with very short, shuffled steps though slightly reciprocal and very rigid/guarded trunk posturing.  Transported back up to room. L stand pivot w/c>EOB using RW with mod assist as described above and pt continuing to require increased time for directing attention  to task, motor planning, and initiation. Sit>supine with mod assist primarily for B LE management into bed. Pt left supine in bed with needs in reach and bed alarm on.  Therapy Documentation Precautions:  Precautions Precautions: Fall, Other (comment) Precaution Comments: SBP<200, aphasia, can be  combative Restrictions Weight Bearing Restrictions: No   Pain:  Pt appears to have some R wrist discomfort at times; however due to aphasia unable to determine fully. Pt refuses to wear cock-up splint stating he "hates" it.     Therapy/Group: Individual Therapy  Ginny Forth , PT, DPT, NCS, CSRS  07/04/2021, 12:59 PM

## 2021-07-04 NOTE — Progress Notes (Addendum)
RN notified of urinary retention requiring in/out catheterizations. Flomax started yesterday. Scr 1.05 on 12/16 to 1.41 12/19 Urine output: 1,250 cc prior 24 hours Urine culture done yesterday:  Component 1 d ago   Specimen Description URINE, CATHETERIZED   Special Requests NONE   Culture  Abnormal  >=100,000 COLONIES/mL ESCHERICHIA COLI  CULTURE REINCUBATED FOR BETTER GROWTH  SUSCEPTIBILITIES TO FOLLOW  Performed at [facility], [address] [place], [place] [address]    Report Status PENDING    Will start Keflex 500 mg TID now>>pharmacy adjusted to q 12 hours for renal dosing Tm 99.0 at 1322 WBC 12.0 today

## 2021-07-04 NOTE — Progress Notes (Signed)
Occupational Therapy Session Note  Patient Details  Name: Devon Elliott. MRN: [id number] Date of Birth: June 02, 1927  Today's Date: 07/04/2021 OT Individual Time: 0569-7948 OT Individual Time Calculation (min): 63 min    Short Term Goals: Week 1:  OT Short Term Goal 1 (Week 1): Pt will actively use RUE during self feeding with mod cues. OT Short Term Goal 2 (Week 1): Pt will demonstrate improved praxis to bathe with min A. OT Short Term Goal 3 (Week 1): Pt will don shirt with min A. OT Short Term Goal 4 (Week 1): pt will sit to stand with min A to prepare for transfers. OT Short Term Goal 5 (Week 1): pt will complete BSC transfers with mod A of 1.  Skilled Therapeutic Interventions/Progress Updates:    Pt received asleep in bed, but awakened to voice, appears agreeable to therapy with increased time and gentle encouragement. Session focus on self-care retraining, activity tolerance, transfer retraining in prep for improved ADL/IADL/func mobility performance + decreased caregiver burden.  Came to sitting EOB on his R with max A to initiate / progress BLE off bed / to lift trunk. R lean in sitting EOB requiring intermittent min to mod A to prevent R LOB. Donned pants with total A. Increased time to initiate sit to stand from elevated height surface with use of RW with overall min A to block R knee and to facilitate upright posture. Difficulty grasping RW with R wrist brace and painful R wrist, demonstrating guarding behaviors throughout session. Attempted stand-pivot without AD with overall max A > w/c on his L. Poor initiation /sequencing requiring max A to pivot hips into chair. Able to wash face at sink and brush hair with overall S. Perseverating on first stating need to go to bathroom, but then adamently declining when presented to bathroom.   Seated in w/c, self-fed breakfast with set-up A of items and use of L hand and S. Cues throughout to take small bites and sips per SLP swallowing  sheet.  Pt with poor 1 step command follow throughout session and confusing therapist for family member, as well.   Pt left seated in w/c with SLP present with safety belt alarm engaged, call bell in reach, and all immediate needs met.    Therapy Documentation Precautions:  Precautions Precautions: Fall, Other (comment) Precaution Comments: SBP<200, aphasia, can be combative Restrictions Weight Bearing Restrictions: No  Pain: ongoing painful R wrist, unable to rate, but sensitive to touch and guarding throughout session; requesting for brace to be doffed   ADL: See Care Tool for more details.  Therapy/Group: Individual Therapy  Volanda Napoleon MS, OTR/L  07/04/2021, 6:53 AM

## 2021-07-04 NOTE — Progress Notes (Signed)
Patient ID: Devon Freed., male   DOB: [birth year], 87 y.o.   MRN: [id number]  Patient son reports he will bring clothing and shoes for the patient on tomorrow 12/21.

## 2021-07-04 NOTE — Progress Notes (Signed)
Physical Therapy Session Note  Patient Details  Name: Devon Elliott. MRN: [id number] Date of Birth: 22-Jun-1927  Today's Date: 07/04/2021 PT Individual Time: 1345-1405 PT Individual Time Calculation (min): 20 min   Short Term Goals: Week 1:  PT Short Term Goal 1 (Week 1): Patient will complete bed mobility with CGA PT Short Term Goal 2 (Week 1): Patient will complete bed <> wc transfers with LRAD and ModA x1 PT Short Term Goal 3 (Week 1): Patient will ambulate >52ft with LRAD and MinA x1  Skilled Therapeutic Interventions/Progress Updates:    Pt received supine in bed asleep, arousable but requires max encouragement for participation in therapy session. Pt eventually agreeable to sit up to EOB. Supine to sit with max A for initiation of transfer, for BLE management, and for trunk elevation. Once in seated position pt with sigificant L lateral lean, unable to maintain sitting balance without total A. Pt also noted to have runny nose, provided tissues. Pt requires total A for sitting balance with no UE support in sitting while blowing his nose. Attempt to have pt utilize UE placement on bedrails or RW for stability in sitting position. Pt unable to follow cues and becomes agitated with manual assist, returns to sidelying. Pt encouraged to sit up to EOB again but declines. Assisted pt with bringing BLE back into bed. Pt left in L sidelying in bed with needs in reach, bed alarm in place. Pt missed 25 min of scheduled therapy session due to refusal and minor agitation.  Therapy Documentation Precautions:  Precautions Precautions: Fall, Other (comment) Precaution Comments: SBP<200, aphasia, can be combative Restrictions Weight Bearing Restrictions: No General: PT Amount of Missed Time (min): 25 Minutes PT Missed Treatment Reason: Patient unwilling to participate     Therapy/Group: Individual Therapy   Peter Congo, PT, DPT, CSRS  07/04/2021, 2:08 PM

## 2021-07-04 NOTE — Consult Note (Signed)
Reason for Consult:Right hand pain Referring Physician: Claudette Laws Time called: 1015 Time at bedside: [address] Devon Elliott. is an 87 y.o. male.  HPI: Saquan suffered a stroke and is in CIR for recovery. He is right hemiparetic as a result. He's been c/o some hand pain and hand surgery was consulted. X-rays showed some abnormality at the 4th MCP joint. He had moderate aphasia and cannot really contribute to history though was able to answer yes/no questions, I think reliably. He denies hx/o gout.  Past Medical History:  Diagnosis Date   Complete tear of left rotator cuff 07/21/2014   Essential hypertension 04/19/2014   Impingement syndrome of left shoulder 07/21/2014   Left hip pain 02/08/2016   Left supraspinatus tenosynovitis 07/21/2014   Pure hypercholesterolemia 04/19/2014    Past Surgical History:  Procedure Laterality Date   ELBOW SURGERY  1995   ESOPHAGOGASTRODUODENOSCOPY N/A 05/01/2020   Procedure: ESOPHAGOGASTRODUODENOSCOPY (EGD);  Surgeon: Toledo, Boykin Nearing, MD;  Location: [facility] END[facility]COPY;  Service: Gastroenterology;  Laterality: N/A;   ESOPHAGOGASTRODUODENOSCOPY (EGD) WITH PROPOFOL N/A 05/31/2016   Procedure: ESOPHAGOGASTRODUODENOSCOPY (EGD) WITH PROPOFOL;  Surgeon: Wyline Mood, MD;  Location: [facility] ENDOSCOPY;  Service: Endoscopy;  Laterality: N/A;   IR CT HEAD LTD  06/19/2021   IR PERCUTANEOUS ART THROMBECTOMY/INFUSION INTRACRANIAL INC DIAG ANGIO  06/19/2021   IR US GUIDE VASC ACCESS RIGHT  06/19/2021   KYPHOPLASTY N/A 07/30/2019   Procedure: L1 KYPHOPLASTY;  Surgeon: Kennedy Bucker, MD;  Location: [facility] ORS;  Service: Orthopedics;  Laterality: N/A;   RADIOLOGY WITH ANESTHESIA N/A 06/19/2021   Procedure: IR WITH ANESTHESIA;  Surgeon: Radiologist, Medication, MD;  Location: [facility] OR;  Service: Radiology;  Laterality: N/A;    Family History  Problem Relation Age of Onset   Pancreatic cancer Mother    Diabetes Father     Social History:  reports that he has quit smoking. He  has never used smokeless tobacco. He reports current alcohol use. He reports that he does not use drugs.  Allergies:  Allergies  Allergen Reactions   Ace Inhibitors     Other reaction(s): Kidney Disorder   Clarithromycin Hives   Etodolac     Other reaction(s): Kidney Disorder   Levofloxacin Swelling    Medications: I have reviewed the patient's current medications.  Results for orders placed or performed during the hospital encounter of 06/29/21 (from the past 48 hour(s))  CBC     Status: Abnormal   Collection Time: 07/03/21  5:45 AM  Result Value Ref Range   WBC 12.9 (H) 4.0 - 10.5 K/uL   RBC 3.56 (L) 4.22 - 5.81 MIL/uL   Hemoglobin 10.4 (L) 13.0 - 17.0 g/dL   HCT 37.1 (L) 06.2 - 69.4 %   MCV 90.7 80.0 - 100.0 fL   MCH 29.2 26.0 - 34.0 pg   MCHC 32.2 30.0 - 36.0 g/dL   RDW 85.4 62.7 - 03.5 %   Platelets 292 150 - 400 K/uL   nRBC 0.0 0.0 - 0.2 %    Comment: Performed at [facility], [address], [place], [place] [address]  Comprehensive metabolic panel     Status: Abnormal   Collection Time: 07/03/21  5:45 AM  Result Value Ref Range   Sodium 137 135 - 145 mmol/L   Potassium 3.8 3.5 - 5.1 mmol/L   Chloride 104 98 - 111 mmol/L   CO2 24 22 - 32 mmol/L   Glucose, Bld 120 (H) 70 - 99 mg/dL  Comment: Glucose reference range applies only to samples taken after fasting for at least 8 hours.   BUN 28 (H) 8 - 23 mg/dL   Creatinine, Ser 1.41 (H) 0.61 - 1.24 mg/dL   Calcium 8.1 (L) 8.9 - 10.3 mg/dL   Total Protein 5.3 (L) 6.5 - 8.1 g/dL   Albumin 2.0 (L) 3.5 - 5.0 g/dL   AST 118 (H) 15 - 41 U/L   ALT 95 (H) 0 - 44 U/L   Alkaline Phosphatase 60 38 - 126 U/L   Total Bilirubin 0.7 0.3 - 1.2 mg/dL   GFR, Estimated 47 (L) >60 mL/min    Comment: (NOTE) Calculated using the CKD-EPI Creatinine Equation (2021)    Anion gap 9 5 - 15    Comment: Performed at [facility], [place] [address] [place], [place] [address]  Urinalysis, Complete w Microscopic Urine,  Catheterized     Status: Abnormal   Collection Time: 07/03/21 12:14 PM  Result Value Ref Range   Color, Urine ORANGE (A) YELLOW    Comment: BIOCHEMICALS MAY BE AFFECTED BY COLOR   APPearance TURBID (A) CLEAR   Specific Gravity, Urine 1.025 1.005 - 1.030   pH 5.5 5.0 - 8.0   Glucose, UA NEGATIVE NEGATIVE mg/dL   Hgb urine dipstick LARGE (A) NEGATIVE   Bilirubin Urine NEGATIVE NEGATIVE   Ketones, ur NEGATIVE NEGATIVE mg/dL   Protein, ur 100 (A) NEGATIVE mg/dL   Nitrite NEGATIVE NEGATIVE   Leukocytes,Ua MODERATE (A) NEGATIVE   Squamous Epithelial / LPF 0-5 0 - 5   WBC, UA >50 0 - 5 WBC/hpf   RBC / HPF >50 0 - 5 RBC/hpf   Bacteria, UA MANY (A) NONE SEEN   WBC Clumps PRESENT    Mucus PRESENT     Comment: Performed at [facility], [address], [place], [place] [address]  Urine Culture     Status: Abnormal (Preliminary result)   Collection Time: 07/03/21 12:14 PM   Specimen: Urine, Catheterized  Result Value Ref Range   Specimen Description URINE, CATHETERIZED    Special Requests NONE    Culture (A)     >=100,000 COLONIES/mL GRAM NEGATIVE RODS CULTURE REINCUBATED FOR BETTER GROWTH SUSCEPTIBILITIES TO FOLLOW Performed at [facility], [place] [address] [place], [place] [address]    Report Status PENDING   CBC with Differential/Platelet     Status: Abnormal   Collection Time: 07/04/21 10:12 AM  Result Value Ref Range   WBC 12.0 (H) 4.0 - 10.5 K/uL   RBC 3.93 (L) 4.22 - 5.81 MIL/uL   Hemoglobin 11.8 (L) 13.0 - 17.0 g/dL   HCT 35.7 (L) 39.0 - 52.0 %   MCV 90.8 80.0 - 100.0 fL   MCH 30.0 26.0 - 34.0 pg   MCHC 33.1 30.0 - 36.0 g/dL   RDW 14.6 11.5 - 15.5 %   Platelets 314 150 - 400 K/uL   nRBC 0.0 0.0 - 0.2 %   Neutrophils Relative % 82 %   Neutro Abs 9.9 (H) 1.7 - 7.7 K/uL   Lymphocytes Relative 7 %   Lymphs Abs 0.8 0.7 - 4.0 K/uL   Monocytes Relative 8 %   Monocytes Absolute 1.0 0.1 - 1.0 K/uL   Eosinophils Relative 1 %   Eosinophils Absolute 0.1 0.0 - 0.5  K/uL   Basophils Relative 1 %   Basophils Absolute 0.1 0.0 - 0.1 K/uL   Immature Granulocytes 1 %   Abs Immature Granulocytes 0.12 (  H) 0.00 - 0.07 K/uL    Comment: Performed at [facility], [place] [address], [place], [place] [address]    No results found.  Review of Systems  Unable to perform ROS: Patient nonverbal  Blood pressure 110/72, pulse (!) 52, temperature 97.8 F (36.6 C), resp. rate 18, weight 67.1 kg, SpO2 96 %. Physical Exam Constitutional:      General: He is not in acute distress.    Appearance: He is well-developed. He is not diaphoretic.  HENT:     Head: Normocephalic and atraumatic.  Eyes:     General: No scleral icterus.       Right eye: No discharge.        Left eye: No discharge.     Conjunctiva/sclera: Conjunctivae normal.  Cardiovascular:     Rate and Rhythm: Normal rate and regular rhythm.  Pulmonary:     Effort: Pulmonary effort is normal. No respiratory distress.  Musculoskeletal:     Cervical back: Normal range of motion.     Comments: Right shoulder, elbow, wrist, digits- no skin wounds, mild erythema noted centered around 4th MCP joint, mild pain with palpation or passive ROM but able to range close to 90 degrees, no instability, no blocks to motion  Sens  Ax/R/M/U grossly intact  Mot   Ax/ R/ PIN/ M/ AIN/ U grossly intact but impaired from CVA  Rad 2+  Skin:    General: Skin is warm and dry.  Neurological:     Mental Status: He is alert.  Psychiatric:        Mood and Affect: Mood normal.        Behavior: Behavior normal.    Assessment/Plan: Right hand pain -- X-ray looks like chronic gouty changes. Will get MRI to see if there's effusion that can be accessed. If so would ask IR to get fluid sample to aid in diagnosis. Given degree of motion do not think septic arthritis is very likely.    Lisette Abu, PA-C Orthopedic Surgery [phone] 07/04/2021, 10:58 AM

## 2021-07-04 NOTE — Progress Notes (Signed)
PROGRESS NOTE   Subjective/Complaints:  Patient aphasic, still has right hand pain.  Spoke with wife via phone today, patient does not have a history of gout.  Discussed x-ray result Appreciate orthopedic note  ROS: limited due to language/communication    Objective:   No results found. Recent Labs    07/03/21 0545  WBC 12.9*  HGB 10.4*  HCT 32.3*  PLT 292    Recent Labs    07/03/21 0545  NA 137  K 3.8  CL 104  CO2 24  GLUCOSE 120*  BUN 28*  CREATININE 1.41*  CALCIUM 8.1*     Intake/Output Summary (Last 24 hours) at 07/04/2021 0852 Last data filed at 07/04/2021 0630 Gross per 24 hour  Intake 360 ml  Output 1250 ml  Net -890 ml         Physical Exam: Vital Signs Blood pressure 110/72, pulse (!) 114, temperature 97.8 F (36.6 C), resp. rate 18, weight 67.1 kg, SpO2 96 %. General no acute distress Heart regular rate and rhythm Abdomen soft bowel sounds soft nontender to palpation Lungs clear to auscultation Mood and affect are appropriate  Skin: No evidence of breakdown, no evidence of rash Neurologic: aphasic, follows 25% or less of simple one step commands. Cranial nerves II through XII intact, motor strength is 5/5 in left and 3- RIght deltoid, bicep, tricep, grip, 5/5 bilateral hip flexor, knee extensors, ankle dorsiflexor and plantar flexor Musculoskeletal: pain with right fourth MCP compression as well as passive range of motion.  Swelling of left fourth MCP extended to PIP chronic appearing arthritic changes in both hands    Assessment/Plan: 1. Functional deficits which require 3+ hours per day of interdisciplinary therapy in a comprehensive inpatient rehab setting. Physiatrist is providing close team supervision and 24 hour management of active medical problems listed below. Physiatrist and rehab team continue to assess barriers to discharge/monitor patient progress toward functional and  medical goals  Care Tool:  Bathing    Body parts bathed by patient: Chest, Face, Right upper leg, Left upper leg   Body parts bathed by helper: Right arm, Left arm, Front perineal area, Buttocks, Left lower leg, Right lower leg     Bathing assist Assist Level: Maximal Assistance - Patient 24 - 49%     Upper Body Dressing/Undressing Upper body dressing   What is the patient wearing?: Pull over shirt    Upper body assist Assist Level: Maximal Assistance - Patient 25 - 49%    Lower Body Dressing/Undressing Lower body dressing      What is the patient wearing?: Pants, Incontinence brief     Lower body assist Assist for lower body dressing: Total Assistance - Patient < 25% (supine rolling)     Toileting Toileting    Toileting assist Assist for toileting: 2 Helpers     Transfers Chair/bed transfer  Transfers assist     Chair/bed transfer assist level: 2 Helpers     Locomotion Ambulation   Ambulation assist      Assist level: 2 helpers Assistive device: Walker-rolling Max distance: 20   Walk 10 feet activity   Assist     Assist level: 2 helpers Assistive  device: Walker-rolling   Walk 50 feet activity   Assist Walk 50 feet with 2 turns activity did not occur: Safety/medical concerns         Walk 150 feet activity   Assist Walk 150 feet activity did not occur: Safety/medical concerns         Walk 10 feet on uneven surface  activity   Assist Walk 10 feet on uneven surfaces activity did not occur: Safety/medical concerns         Wheelchair     Assist Is the patient using a wheelchair?: Yes Type of Wheelchair: Manual    Wheelchair assist level: Maximal Assistance - Patient 25 - 49% Max wheelchair distance: 150    Wheelchair 50 feet with 2 turns activity    Assist        Assist Level: Maximal Assistance - Patient 25 - 49%   Wheelchair 150 feet activity     Assist      Assist Level: Maximal Assistance -  Patient 25 - 49%   Blood pressure 110/72, pulse (!) 114, temperature 97.8 F (36.6 C), resp. rate 18, weight 67.1 kg, SpO2 96 %.  Medical Problem List and Plan: 1. 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  Antithrombotics: -DVT/anticoagulation:  Pharmaceutical: Other (comment)  apixaban 5 mg BID             -antiplatelet therapy: none 3. Pain Management: Tylenol Added diclofenac gel to RIght hand  4. Mood: Depakote,              -antipsychotic agents: Seroquel 25 mg daily and q HS, Seroquel 25 mg q 6 hours PRN agitation;  Namenda 5. Neuropsych: This patient is not capable of making decisions on his own behalf. 6. Skin/Wound Care: routine skin checks 7. Fluids/Electrolytes/Nutrition: routine ins and outs with follow-up chemistries. Hypokalemia resolved. 8. Atrial fibrillation: rate controlled. Metoprolol 25 mg BID, Eliquis 5 mg daily 9. Hypertension: goal [id number] systolic BP post thrombectomy. Continue metoprolol. Amlodipine added 12/13 Vitals:   07/03/21 1948 07/04/21 0832  BP: 99/67 110/72  Pulse: 95 (!) 114  Resp: 18   Temp: 97.8 F (36.6 C)   SpO2: 96%   12/19 improved bp control, HR up this am , has been below 100BPM thus far   10: Hyperlipidemia: LDL =  110; goal <70. Started on high intensity statin: atorvastatin 40 mg. Continue at discharge. 11: Delirium with history of mild dementia. Continue Depakote, Seroquel and Namenda Reduced comprehension associated with receptive language deficits are contributing  12: HFrEF: currently euvolemic 13: AKI: resolved. BUN/Cr trending back up   BMP Latest Ref Rng & Units 07/03/2021 06/30/2021 06/29/2021  Glucose 70 - 99 mg/dL 120(H) 113(H) 115(H)  BUN 8 - 23 mg/dL 28(H) 24(H) 26(H)   Creatinine 0.61 - 1.24 mg/dL 1.41(H) 1.05 1.29(H)  Sodium 135 - 145 mmol/L 137 143 142  Potassium 3.5 - 5.1 mmol/L 3.8 4.0 4.1  Chloride 98 - 111 mmol/L 104 109 109  CO2 22 - 32 mmol/L _0 Calcium 8.9 - 10.3 mg/dL 8.1(L) 8.6(L) 8.5(L)  Some fluctuation in range of 1.0-1.4, cont to monitor ,  no nephrotoxic meds identified , negative fluid balance with poor intake , this is likely contributing   14: Urinary retention: Foley cath placed 12/9. Will discontinue in AM. Orders in place for bladder scanning, PVR check and in/out cath if necessary  -blood clots in urine with caths d/t eliquis  -add flomax, bp can tolerate 15. Leukocytosis, has been bouncing from 7K to 12 K over the last 2 weeks  12.9 on 12/19  -afebrile  -check U/A UCX. Consider CXR  16.  Left fourth digit swelling, x-rays show erosive changes likely chronic however given some elevation of white cells as well as elevated ESR and CRP would pursue MRI of the finger/hand right side, appreciate Ortho assistance LOS: 5 days A FACE TO FACE EVALUATION WAS PERFORMED  Charlett Blake 07/04/2021, 8:52 AM

## 2021-07-05 ENCOUNTER — Inpatient Hospital Stay (HOSPITAL_COMMUNITY): Payer: Medicare Other

## 2021-07-05 MED ORDER — LORAZEPAM 1 MG PO TABS
1.0000 mg | ORAL_TABLET | Freq: Once | ORAL | Status: AC
  Administered 2021-07-05: 16:00:00 1 mg via ORAL
  Filled 2021-07-05: qty 1

## 2021-07-05 NOTE — Progress Notes (Signed)
Physical Therapy Session Note  Patient Details  Name: Devon Elliott. MRN: [id number] Date of Birth: 11-08-1926  Today's Date: 07/05/2021 PT Individual Time: 1310-1425 PT Individual Time Calculation (min): 75 min   Short Term Goals: Week 1:  PT Short Term Goal 1 (Week 1): Patient will complete bed mobility with CGA PT Short Term Goal 2 (Week 1): Patient will complete bed <> wc transfers with LRAD and ModA x1 PT Short Term Goal 3 (Week 1): Patient will ambulate >17f with LRAD and MinA x1  Skilled Therapeutic Interventions/Progress Updates:   Pt received sitting in recliner and agreeable to PT. NT reports that pt has had BM, but uncorroperitve to allow cleaning. Pt assist with ambulatory transfer to bathroom with RW and mod assist into standing and min assist for gait to maintain forward progression of RW. Sit<>stand from toilet for pericare with total A and min assist for balance. [facility]  with RW x 468fand [id number] with min assist overall with assist for guidance of RW to avoid obstacles on the R. Nustep reciprocal movement training x 5 minutes with max assist throughout to engage in activity in BLE as well as safety with transfers on and off machies due to mild irritation and confusion due to aphasia. Pt returned to room and performed ambulatory transfer to bed with RW and mod assist. Sit>supine completed with increased time and min assist and feet, and left supine in bed with call bell in reach and all needs met.         Therapy Documentation Precautions:  Precautions Precautions: Fall, Other (comment) Precaution Comments: SBP<200, aphasia, can be combative Restrictions Weight Bearing Restrictions: No  Pain:  denies   Therapy/Group: Individual Therapy  AusLorie Phenix/21/2022, 2:52 PM

## 2021-07-05 NOTE — Progress Notes (Signed)
Physical Therapy Session Note  Patient Details  Name: Devon Elliott. MRN: [id number] Date of Birth: [birth year]  Today's Date: 07/05/2021 PT Individual Time: 0900-0918 PT Individual Time Calculation (min): 18 min; missed 12 mins (unwilling to participate)    Short Term Goals: Week 1:  PT Short Term Goal 1 (Week 1): Patient will complete bed mobility with CGA PT Short Term Goal 2 (Week 1): Patient will complete bed <> wc transfers with LRAD and ModA x1 PT Short Term Goal 3 (Week 1): Patient will ambulate >6ft with LRAD and MinA x1  Skilled Therapeutic Interventions/Progress Updates:    Patient received reclined in bed, remaining with aphasia. He did not appear to be in pain, but did not engage in any tasks using R UE. Patient provided with coffee. PT assisting patient with repositioning in bed for safe swallow and PO intake. MaxA required due to aphasia and poor motor planning. After sitting upright, patient saying "thank you bye." PT unable to encourage patient to continue to participate in meaningful therapy. Coffee moved away from patient due to need for full supervision. RN aware of patients location. Bed alarm on, call light within reach. -  Therapy Documentation Precautions:  Precautions Precautions: Fall, Other (comment) Precaution Comments: SBP<200, aphasia, can be combative Restrictions Weight Bearing Restrictions: No   Therapy/Group: Individual Therapy  Elizebeth Koller, PT, DPT, CBIS  07/05/2021, 7:41 AM

## 2021-07-05 NOTE — Patient Care Conference (Signed)
Inpatient RehabilitationTeam Conference and Plan of Care Update Date: 07/05/2021   Time: 10:08 AM    Patient Name: Devon Elliott.      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 Sula Soda Social Worker Present: Lavera Guise, BSW Nurse Present: Chana Bode, RN PT Present: Grier Rocher, PT OT Present: Annye English, OT SLP Present: Eilene Ghazi, SLP PPS Coordinator present : Fae Pippin, SLP     Current Status/Progress Goal Weekly Team Focus  Bowel/Bladder   Inability to urinate. In & out cath Q6hr. incontinent of bowel. LBM 12/20  Regain continence of bowel. Regain ability to urinate.  Continue In & out caths Q6hr PRN.   Swallow/Nutrition/ Hydration   min A  supervision A for least restrictive intake  assess diet tolerance and possible diet advancement   ADL's   S self-feeding with LUE, S seated grooming, min A oral care, Max UB dressing/sponge bathing, total A toileting, LB dressing, +2 toilet transfers via stand-pivot  currently S UB dressing, min A bathing, LB dressing, toileting, CGA toilet transfers - most likely will need to be downgraded  RUE NMR, transfer/self-care /balance retraining, activity tolerance, pt/family education, activity tolerance   Mobility   requries significant amount of time and redirection to attend to cues and motor plan/process, very fearful of falling therefore responds poorly to manual facilitation and requries more time to respond to verbal cuing resulting in heavy fluctuations -  max assist bed mobility, CGA to total assist sitting balance EOB, +2 mod assist sit<>stand and stand pivot transfers using RW (responds well to use of RW for B UE support due to fear of falling), able to walk 66ft with RW mod assist & +2 w/c follow but required 10+minutes  CGA overall at ambulatory level  activity tolerance, participation in therapy, pt education, motor planning, bed mobility training, sitting balance, transfer training, gait training, standing tolerance   Communication   mod A  min-to-mod A  expressing basic needs, following commands, attending to tasks   Safety/Cognition/ Behavioral Observations  mod-to-max A  min A attention  focused attention, orientation, safety   Pain   No c/o pain  Pain <3/10  Assess Qshift and prn   Skin   Scatter bruising. Remainder of skin intact  Maintain skin integrity  Assess Qshift and prn     Discharge Planning:  Discharging home with spouse & son to provide assistance. 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 Right 4th finger/hand edema and pain. Ortho following up to r/o gout vs other; wrist splint applied. UTI treated with Abx. Remains incontinent of bowel and unable to void on his own, needs I+O catheterizations q 6 hrs even after medication. Hopeful will be able to void on his own for discharge. Prn meds for pain.  Patient on target to meet rehab goals: no, little progress noted. Currently requires total assist for bathing and dressing at the edge of the bed. Needs mod- moax assist for stand pivot to [facility]. Not  receptive to tactile cues and needs constant verbal cues due to confusion, poor attention and distraction from right UE guarding.  Mod assist for stand pivot transfers due to fear of falling and posterior bias. Goals for discharge set for CGA - min assist.   *See Care Plan and progress notes for long and short-term goals.   Revisions to Treatment Plan:   Downgraded OT and PT goals   Teaching Needs: Safety, transfers, medication management, toileting/I+O catheterizations, secondary risk management, etc.   Current Barriers to Discharge: Decreased caregiver support, Home enviroment access/layout, and Neurogenic bowel and bladder  Possible Resolutions to Barriers: Family education with wife and son     Medical Summary Current Status: Right hand pain gout vs infection, aphasic, UTI  Barriers to Discharge: Medical stability;Neurogenic Bowel & Bladder  Barriers to Discharge Comments: on I/O caths Possible Resolutions to Barriers/Weekly Focus: cont abx for UTI, cont bladder training program , MRI for hand pain, Ortho on consult   Continued Need for Acute Rehabilitation Level of Care: The patient requires daily medical management by a physician with specialized training in physical medicine and rehabilitation for the following reasons: Direction of a multidisciplinary physical rehabilitation program to maximize functional independence : Yes Medical management of patient stability for increased activity during participation in an intensive rehabilitation regime.: Yes Analysis of laboratory values and/or radiology reports with any subsequent need for medication adjustment and/or medical intervention. : Yes   I attest that I was present, lead the team conference, and concur with the assessment and plan of the team.   Chana Bode B 07/05/2021, 2:01 PM

## 2021-07-05 NOTE — Progress Notes (Signed)
Occupational Therapy Session Note  Patient Details  Name: Devon Elliott. MRN: [id number] Date of Birth: 1927/01/29  Today's Date: 07/05/2021 OT Individual Time: 4259-5638 OT Individual Time Calculation (min): 55 min    Short Term Goals: Week 1:  OT Short Term Goal 1 (Week 1): Pt will actively use RUE during self feeding with mod cues. OT Short Term Goal 2 (Week 1): Pt will demonstrate improved praxis to bathe with min A. OT Short Term Goal 3 (Week 1): Pt will don shirt with min A. OT Short Term Goal 4 (Week 1): pt will sit to stand with min A to prepare for transfers. OT Short Term Goal 5 (Week 1): pt will complete BSC transfers with mod A of 1.  Skilled Therapeutic Interventions/Progress Updates:    Pt received asleep in bed but easily awakened to voice/tactile cues and, agreeable to therapy with encouragement/increased time. Session focus on self-care retraining, activity tolerance, transfer retraining in prep for improved ADL/IADL/func mobility performance + decreased caregiver burden. Max A to come into sitting EOB on L side of bed, pt attempting to lay back down periodically requiring encouragement to get up in the w/c. Stand-pivot with RW in front of pt with min A to power up and then complete transfer after failed attempt transferring without AD. Washed face with S seated at sink and total A to comb hair dafter presenting pt with comb.   Total A w/c transport to and from gym.  Oriented to unit and holiday decorations to promote increased OOB/ out of room activity tolerance.   Attempted kinetron BLE strengthening, but pt requiring total A to push BLE against pedals due to poor initiation/comprehension of task.  Stand-pivot in similar manner as above > recliner. Pt momentarily agitated at times with therapist assist to scoot back in chair, but otherwise responded well to being out of room with gentle verbal cuing/assurance.  Pt left seated in recliner with BLE elevated  with  chair pad alarm engaged, call bell in reach, and all immediate needs met.    Therapy Documentation Precautions:  Precautions Precautions: Fall, Other (comment) Precaution Comments: SBP<200, aphasia, can be combative Restrictions Weight Bearing Restrictions: No  Pain:  Ongoing guarding of RUE, did not rate ADL: See Care Tool for more details.  Therapy/Group: Individual Therapy  Volanda Napoleon MS, OTR/L  07/05/2021, 6:50 AM

## 2021-07-05 NOTE — Progress Notes (Signed)
Patient ID: Devon Elliott., male   DOB: [birth year], 87 y.o.   LOV:[id number]  Sw made attempt to call pt son. Left VM. Will wait for return phone call

## 2021-07-05 NOTE — Progress Notes (Addendum)
PROGRESS NOTE   Subjective/Complaints:  Ucx result reviewed Pt remains aphasic   ROS: limited due to language/communication    Objective:   No results found. Recent Labs    07/03/21 0545 07/04/21 1012  WBC 12.9* 12.0*  HGB 10.4* 11.8*  HCT 32.3* 35.7*  PLT 292 314    Recent Labs    07/03/21 0545  NA 137  K 3.8  CL 104  CO2 24  GLUCOSE 120*  BUN 28*  CREATININE 1.41*  CALCIUM 8.1*     Intake/Output Summary (Last 24 hours) at 07/05/2021 0844 Last data filed at 07/05/2021 0221 Gross per 24 hour  Intake 180 ml  Output 1175 ml  Net -995 ml         Physical Exam: Vital Signs Blood pressure 139/85, pulse 91, temperature 98.2 F (36.8 C), temperature source Oral, resp. rate 18, weight 67.1 kg, SpO2 97 %.  General: No acute distress Mood and affect are appropriate Heart: Regular rate and rhythm no rubs murmurs or extra sounds Lungs: Clear to auscultation, breathing unlabored, no rales or wheezes Abdomen: Positive bowel sounds, soft nontender to palpation, nondistended Extremities: No clubbing, cyanosis, RIght 3rd and 4th finger edema   Skin: No evidence of breakdown, no evidence of rash , erythema RIght 4th digit Neurologic: aphasic, follows 25% or less of simple one step commands. Cranial nerves II through XII intact, motor strength is 5/5 in left and 3- RIght deltoid, bicep, tricep, grip, 5/5 bilateral hip flexor, knee extensors, ankle dorsiflexor and plantar flexor Musculoskeletal: pain with right fourth MCP compression as well as passive range of motion.  Swelling of right  fourth MCP extended to PIP chronic appearing arthritic changes in both hands    Assessment/Plan: 1. Functional deficits which require 3+ hours per day of interdisciplinary therapy in a comprehensive inpatient rehab setting. Physiatrist is providing close team supervision and 24 hour management of active medical problems listed  below. Physiatrist and rehab team continue to assess barriers to discharge/monitor patient progress toward functional and medical goals  Care Tool:  Bathing    Body parts bathed by patient: Chest, Face, Right upper leg, Left upper leg   Body parts bathed by helper: Right arm, Left arm, Front perineal area, Buttocks, Left lower leg, Right lower leg     Bathing assist Assist Level: Maximal Assistance - Patient 24 - 49%     Upper Body Dressing/Undressing Upper body dressing   What is the patient wearing?: Pull over shirt    Upper body assist Assist Level: Maximal Assistance - Patient 25 - 49%    Lower Body Dressing/Undressing Lower body dressing      What is the patient wearing?: Pants, Incontinence brief     Lower body assist Assist for lower body dressing: Total Assistance - Patient < 25% (supine rolling)     Toileting Toileting    Toileting assist Assist for toileting: 2 Helpers     Transfers Chair/bed transfer  Transfers assist     Chair/bed transfer assist level: Moderate Assistance - Patient 50 - 74% Chair/bed transfer assistive device: Walker, Clinical biochemist   Ambulation assist      Assist level:  2 helpers (mod A of 1 and +2 w/c follow) Assistive device: Walker-rolling Max distance: 50f   Walk 10 feet activity   Assist     Assist level: 2 helpers Assistive device: Walker-rolling   Walk 50 feet activity   Assist Walk 50 feet with 2 turns activity did not occur: Safety/medical concerns         Walk 150 feet activity   Assist Walk 150 feet activity did not occur: Safety/medical concerns         Walk 10 feet on uneven surface  activity   Assist Walk 10 feet on uneven surfaces activity did not occur: Safety/medical concerns         Wheelchair     Assist Is the patient using a wheelchair?: Yes Type of Wheelchair: Manual    Wheelchair assist level: Maximal Assistance - Patient 25 - 49% Max  wheelchair distance: 150    Wheelchair 50 feet with 2 turns activity    Assist        Assist Level: Maximal Assistance - Patient 25 - 49%   Wheelchair 150 feet activity     Assist      Assist Level: Maximal Assistance - Patient 25 - 49%   Blood pressure 139/85, pulse 91, temperature 98.2 F (36.8 C), temperature source Oral, resp. rate 18, weight 67.1 kg, SpO2 97 %.  Medical Problem List and Plan: 1. 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  Antithrombotics: -DVT/anticoagulation:  Pharmaceutical: Other (comment)  apixaban 5 mg BID             -antiplatelet therapy: none 3. Pain Management: Tylenol Added diclofenac gel to RIght hand - hand pain as per ortho suspect gout but septic arthritis in DDx await MRI RIght hand  4. Mood: Depakote,              -antipsychotic agents: Seroquel 25 mg daily and q HS, Seroquel 25 mg q 6 hours PRN agitation;  Namenda 5. Neuropsych: This patient is not capable of making decisions on his own behalf. 6. Skin/Wound Care: routine skin checks 7. Fluids/Electrolytes/Nutrition: routine ins and outs with follow-up chemistries. Hypokalemia resolved. 8. Atrial fibrillation: rate controlled. Metoprolol 25 mg BID, Eliquis 5 mg daily 9. Hypertension: goal 1761-950systolic BP post thrombectomy. Continue metoprolol. Amlodipine added 12/13 Vitals:   07/05/21 0350 07/05/21 0811  BP: 120/63 139/85  Pulse: 87 91  Resp: 18   Temp: 98.2 F (36.8 C)   SpO2: 97%   12/19 improved bp control, HR up this am , has been below 100BPM thus far   10: Hyperlipidemia: LDL =  110; goal <70. Started on high intensity statin: atorvastatin 40 mg. Continue at discharge. 11: Delirium with history of mild dementia.  Continue Depakote, Seroquel and Namenda Reduced comprehension associated with receptive language deficits are contributing  12: HFrEF: currently euvolemic 13: AKI: resolved. BUN/Cr trending back up   BMP Latest Ref Rng & Units 07/03/2021 06/30/2021 06/29/2021  Glucose 70 - 99 mg/dL 120(H) 113(H) 115(H)  BUN 8 - 23 mg/dL 28(H) 24(H) 26(H)  Creatinine 0.61 - 1.24 mg/dL 1.41(H) 1.05 1.29(H)  Sodium 135 -  145 mmol/L 137 143 142  Potassium 3.5 - 5.1 mmol/L 3.8 4.0 4.1  Chloride 98 - 111 mmol/L 104 109 109  CO2 22 - 32 mmol/L 24 25 24   Calcium 8.9 - 10.3 mg/dL 8.1(L) 8.6(L) 8.5(L)  Some fluctuation in range of 1.0-1.4, cont to monitor , no nephrotoxic meds identified , negative fluid balance with poor intake , this is likely contributing   14: Urinary retention: Foley cath placed 12/9. Will discontinue in AM. Orders in place for bladder scanning, PVR check and in/out cath if necessary  -blood clots in urine with caths d/t eliquis  -add flomax, bp can tolerate 15. Leukocytosis, has been bouncing from 7K to 12 K over the last 2 weeks  12.9 on 12/19  -afebrile Elevated ESR and CRP Awiat hand MRI  -check U/A+, E coli >100K on Keflex, await sensitivities 16.RIght fourth digit swelling, x-rays show erosive changes likely chronic however given some elevation of white cells as well as elevated ESR and CRP would pursue MRI of the finger/hand right side, appreciate Ortho assistance LOS: 6 days A FACE TO FACE EVALUATION WAS PERFORMED  Charlett Blake 07/05/2021, 8:44 AM

## 2021-07-05 NOTE — Progress Notes (Signed)
Attempted MRI on pt. Unable to get pt still on scan table, pt combative, grabbing technologist, unable to get pt to extend arm for imaging. Ordering PA aware, wants to try again 12/22.

## 2021-07-06 ENCOUNTER — Inpatient Hospital Stay (HOSPITAL_COMMUNITY): Payer: Medicare Other

## 2021-07-06 LAB — URINE CULTURE: Culture: >=100000 — AB

## 2021-07-06 MED ORDER — LORAZEPAM 2 MG/ML IJ SOLN
1.0000 mg | INTRAMUSCULAR | Status: AC
  Administered 2021-07-06: 12:00:00 1 mg via INTRAVENOUS
  Filled 2021-07-06: qty 1

## 2021-07-06 MED ORDER — CHLORHEXIDINE GLUCONATE CLOTH 2 % EX PADS
5.0000 | MEDICATED_PAD | Freq: Two times a day (BID) | CUTANEOUS | Status: DC
  Administered 2021-07-07 – 2021-07-15 (×15): 5 via TOPICAL

## 2021-07-06 NOTE — Progress Notes (Addendum)
Physical Therapy Session Note  Patient Details  Name: Devon Elliott. MRN: [id number] Date of Birth: 03-07-1927  Today's Date: 07/06/2021 PT Individual Time: 2426-8341 PT Individual Time Calculation (min): 23 min   Short Term Goals: Week 1:  PT Short Term Goal 1 (Week 1): Patient will complete bed mobility with CGA PT Short Term Goal 2 (Week 1): Patient will complete bed <> wc transfers with LRAD and ModA x1 PT Short Term Goal 3 (Week 1): Patient will ambulate >32ft with LRAD and MinA x1   Skilled Therapeutic Interventions/Progress Updates:  Session 1  Pt received supine in bed. Max assist from PT to transfer to sitting EOB due to resistance to any out of bed activity. Once EOB min assist to maintain balance initially, but improved to supervision assist once engaged in phone conversation with wife. Sit<>stand with mod-max assist x 2 to don clean brief with resistance to movement by pt due to reported pain in the RUE. Pt unable to rate. Once sitting back on bed, pt returned to supine with supervision assist despite encouragement from PT to remain out of bed. Scooting to [facility] with mod assist from PT.    Session 2. Attempted to see pt for PM treatment session but pt extremely somnolent due to ativan taken prior to MRI per RN. Noted to have sleep apnea breathing pattern. Will continue to monitor.      Therapy Documentation Precautions:  Precautions Precautions: Fall, Other (comment) Precaution Comments: SBP<200, aphasia, can be combative Restrictions Weight Bearing Restrictions: No General: PT Amount of Missed Time (min): 60 Minutes PT Missed Treatment Reason: Patient fatigue   Therapy/Group: Individual Therapy  Golden Pop 07/06/2021, 9:11 AM

## 2021-07-06 NOTE — Progress Notes (Signed)
Patient ID: Devon Freed., male   DOB: 02-21-1927, 87 y.o.   MRN: [id number]  Family education scheduled 07/12/2021 9-12AM

## 2021-07-06 NOTE — Progress Notes (Signed)
Occupational Therapy Session Note  Patient Details  Name: Devon Elliott. MRN: [id number] Date of Birth: [birth year]  Today's Date: 07/06/2021 OT Individual Time: 4854-6270 OT Individual Time Calculation (min): 17 min  and Today's Date: 07/06/2021 OT Missed Time: 45 Minutes Missed Time Reason: CT/MRI;Patient fatigue   Short Term Goals: Week 1:  OT Short Term Goal 1 (Week 1): Pt will actively use RUE during self feeding with mod cues. OT Short Term Goal 2 (Week 1): Pt will demonstrate improved praxis to bathe with min A. OT Short Term Goal 3 (Week 1): Pt will don shirt with min A. OT Short Term Goal 4 (Week 1): pt will sit to stand with min A to prepare for transfers. OT Short Term Goal 5 (Week 1): pt will complete BSC transfers with mod A of 1.  Skilled Therapeutic Interventions/Progress Updates:    Attempt 1: Attempted to see pt at scheduled therapy time. Unable to rouse pt despite max tactile and verbal cues and environment modifications (turning on lights, adjust bed). Pt continued to not open eyes.  Attempt 2: Returned 1 hour later and pt with SLP and not available for OT at this time.  Session [phone]): Pt received asleep in bed with DIL Harriet present. Pt rousing briefly to say hello to DIL, but did not open eyes and quickly returning to sleep despite tactile and verbal cuing. Attempted to place in hearing aids that DIL brought, but they are not changed. She reports she will bring them back this Saturday. Confirmed PLOF with DIL and that pt has PCA to assist with IADL / going out into community. Wife will be unable to assist pt physically or cognitively. Family is interested in SNF or other care options, notified SW and SW to follow up with family on resources going forward. Pt then taken for MRI. Pt missed 45 min of OT this date due to MRI/fatigue.  Therapy Documentation Precautions:  Precautions Precautions: Fall, Other (comment) Precaution Comments: SBP<200, aphasia,  can be combative Restrictions Weight Bearing Restrictions: No  Pain: asleep, no s/sx   ADL: See Care Tool for more details.  Therapy/Group: Individual Therapy  Claudie Revering MS, OTR/L  07/06/2021, 6:55 AM

## 2021-07-06 NOTE — Plan of Care (Signed)
°  Problem: RH Balance Goal: LTG Patient will maintain dynamic standing balance (PT) Description: LTG:  Patient will maintain dynamic standing balance with assistance during mobility activities (PT) Flowsheets (Taken 07/06/2021 1359) LTG: Pt will maintain dynamic standing balance during mobility activities with:: Minimal Assistance - Patient > 75%   Problem: Sit to Stand Goal: LTG:  Patient will perform sit to stand with assistance level (PT) Description: LTG:  Patient will perform sit to stand with assistance level (PT) Flowsheets (Taken 07/06/2021 1359) LTG: PT will perform sit to stand in preparation for functional mobility with assistance level: Minimal Assistance - Patient > 75%   Problem: RH Bed Mobility Goal: LTG Patient will perform bed mobility with assist (PT) Description: LTG: Patient will perform bed mobility with assistance, with/without cues (PT). Flowsheets (Taken 07/06/2021 1359) LTG: Pt will perform bed mobility with assistance level of: Minimal Assistance - Patient > 75%   Problem: RH Bed to Chair Transfers Goal: LTG Patient will perform bed/chair transfers w/assist (PT) Description: LTG: Patient will perform bed to chair transfers with assistance (PT). Flowsheets (Taken 07/06/2021 1359) LTG: Pt will perform Bed to Chair Transfers with assistance level: Minimal Assistance - Patient > 75%   Problem: RH Car Transfers Goal: LTG Patient will perform car transfers with assist (PT) Description: LTG: Patient will perform car transfers with assistance (PT). Flowsheets (Taken 07/06/2021 1359) LTG: Pt will perform car transfers with assist:: Minimal Assistance - Patient > 75%   Problem: RH Ambulation Goal: LTG Patient will ambulate in controlled environment (PT) Description: LTG: Patient will ambulate in a controlled environment, # of feet with assistance (PT). Flowsheets (Taken 07/06/2021 1359) LTG: Pt will ambulate in controlled environ  assist needed:: Minimal Assistance -  Patient > 75% LTG: Ambulation distance in controlled environment: 64ft with LRAD Goal: LTG Patient will ambulate in home environment (PT) Description: LTG: Patient will ambulate in home environment, # of feet with assistance (PT). Flowsheets (Taken 07/06/2021 1359) LTG: Pt will ambulate in home environ  assist needed:: Minimal Assistance - Patient > 75% LTG: Ambulation distance in home environment: 43ft   Problem: RH Wheelchair Mobility Goal: LTG Patient will propel w/c in controlled environment (PT) Description: LTG: Patient will propel wheelchair in controlled environment, # of feet with assist (PT) Flowsheets Taken 07/06/2021 1359 by Golden Pop, PT LTG: Pt will propel w/c in controlled environ  assist needed:: Minimal Assistance - Patient > 75% Taken 06/30/2021 1100 by Merry Lofty A, PT LTG: Propel w/c distance in controlled environment: 150 Goal: LTG Patient will propel w/c in home environment (PT) Description: LTG: Patient will propel wheelchair in home environment, # of feet with assistance (PT). Flowsheets Taken 07/06/2021 1359 by Golden Pop, PT LTG: Pt will propel w/c in home environ  assist needed:: Minimal Assistance - Patient > 75% Taken 06/30/2021 1100 by Westley Foots, PT Distance: wheelchair distance in controlled environment: 50

## 2021-07-06 NOTE — Progress Notes (Signed)
PROGRESS NOTE   Subjective/Complaints:  Pt sleepy- woke briefly- aphasic.    ROS: limited due to language/communication Objective:   No results found. Recent Labs    07/04/21 1012  WBC 12.0*  HGB 11.8*  HCT 35.7*  PLT 314   No results for input(s): NA, K, CL, CO2, GLUCOSE, BUN, CREATININE, CALCIUM in the last 72 hours.  Intake/Output Summary (Last 24 hours) at 07/06/2021 1033 Last data filed at 07/06/2021 0330 Gross per 24 hour  Intake 200 ml  Output 1150 ml  Net -950 ml        Physical Exam: Vital Signs Blood pressure (!) 145/90, pulse 87, temperature 98.4 F (36.9 C), resp. rate 18, weight 67.1 kg, SpO2 98 %.    General: sleepy- woke briefly, but aphasic;  NAD HENT: conjugate gaze; oropharynx moist CV: regular rate; no JVD Pulmonary: CTA B/L; no W/R/R- good air movement GI: soft, NT, ND, (+)BS Psychiatric: appropriate; sleepy Neurological: sleepy- woke briefly- aphasic Extremities: No clubbing, cyanosis, RIght 3rd and 4th finger edema   Skin: No evidence of breakdown, no evidence of rash , erythema RIght 4th digit Neurologic: aphasic, follows 25% or less of simple one step commands. Cranial nerves II through XII intact, motor strength is 5/5 in left and 3- RIght deltoid, bicep, tricep, grip, 5/5 bilateral hip flexor, knee extensors, ankle dorsiflexor and plantar flexor Musculoskeletal: pain with right fourth MCP compression as well as passive range of motion.  Swelling of right  fourth MCP extended to PIP chronic appearing arthritic changes in both hands    Assessment/Plan: 1. Functional deficits which require 3+ hours per day of interdisciplinary therapy in a comprehensive inpatient rehab setting. Physiatrist is providing close team supervision and 24 hour management of active medical problems listed below. Physiatrist and rehab team continue to assess barriers to discharge/monitor patient progress  toward functional and medical goals  Care Tool:  Bathing    Body parts bathed by patient: Chest, Face, Right upper leg, Left upper leg   Body parts bathed by helper: Right arm, Left arm, Front perineal area, Buttocks, Left lower leg, Right lower leg     Bathing assist Assist Level: Maximal Assistance - Patient 24 - 49%     Upper Body Dressing/Undressing Upper body dressing   What is the patient wearing?: Pull over shirt    Upper body assist Assist Level: Maximal Assistance - Patient 25 - 49%    Lower Body Dressing/Undressing Lower body dressing      What is the patient wearing?: Pants, Incontinence brief     Lower body assist Assist for lower body dressing: Total Assistance - Patient < 25% (supine rolling)     Toileting Toileting    Toileting assist Assist for toileting: 2 Helpers     Transfers Chair/bed transfer  Transfers assist     Chair/bed transfer assist level: Moderate Assistance - Patient 50 - 74% Chair/bed transfer assistive device: Walker, Clinical biochemist   Ambulation assist      Assist level: 2 helpers (mod A of 1 and +2 w/c follow) Assistive device: Walker-rolling Max distance: 30f   Walk 10 feet activity   Assist  Assist level: 2 helpers Assistive device: Walker-rolling   Walk 50 feet activity   Assist Walk 50 feet with 2 turns activity did not occur: Safety/medical concerns         Walk 150 feet activity   Assist Walk 150 feet activity did not occur: Safety/medical concerns         Walk 10 feet on uneven surface  activity   Assist Walk 10 feet on uneven surfaces activity did not occur: Safety/medical concerns         Wheelchair     Assist Is the patient using a wheelchair?: Yes Type of Wheelchair: Manual    Wheelchair assist level: Maximal Assistance - Patient 25 - 49% Max wheelchair distance: 150    Wheelchair 50 feet with 2 turns activity    Assist        Assist  Level: Maximal Assistance - Patient 25 - 49%   Wheelchair 150 feet activity     Assist      Assist Level: Maximal Assistance - Patient 25 - 49%   Blood pressure (!) 145/90, pulse 87, temperature 98.4 F (36.9 C), resp. rate 18, weight 67.1 kg, SpO2 98 %.  Medical Problem List and Plan: 1. Functional deficits secondary to left MCA infarct likely secondary to embolism Righ themiparesis and moderate global aphasia , suspect apraxia as well              -patient may  shower             -ELOS/Goals: 12-14d, team conf in am   Continue CIR- PT, OT and SLP 2.  Antithrombotics: -DVT/anticoagulation:  Pharmaceutical: Other (comment)  apixaban 5 mg BID             -antiplatelet therapy: none 3. Pain Management: Tylenol Added diclofenac gel to RIght hand - hand pain as per ortho suspect gout but septic arthritis in DDx await MRI RIght hand  12/22- pt combative- cannot get MRI- will d/w PA about sedation.  4. Mood: Depakote,              -antipsychotic agents: Seroquel 25 mg daily and q HS, Seroquel 25 mg q 6 hours PRN agitation;  Namenda 5. Neuropsych: This patient is not capable of making decisions on his own behalf. 6. Skin/Wound Care: routine skin checks 7. Fluids/Electrolytes/Nutrition: routine ins and outs with follow-up chemistries. Hypokalemia resolved. 8. Atrial fibrillation: rate controlled. Metoprolol 25 mg BID, Eliquis 5 mg daily 9. Hypertension: goal 076-226 systolic BP post thrombectomy. Continue metoprolol. Amlodipine added 12/13 Vitals:   07/05/21 1932 07/06/21 0320  BP: (!) 142/92 (!) 145/90  Pulse: 86 87  Resp: 16 18  Temp: 98.7 F (37.1 C) 98.4 F (36.9 C)  SpO2: 96% 98%  12/19 improved bp control, HR up this am , has been below 100BPM thus far   10: Hyperlipidemia: LDL =  110; goal <70. Started on high intensity statin: atorvastatin 40 mg. Continue at discharge. 11: Delirium with history of mild dementia. Continue Depakote, Seroquel and Namenda Reduced  comprehension associated with receptive language deficits are contributing  12: HFrEF: currently euvolemic 13: AKI: resolved. BUN/Cr trending back up   BMP Latest Ref Rng & Units 07/03/2021 06/30/2021 06/29/2021  Glucose 70 - 99 mg/dL 120(H) 113(H) 115(H)  BUN 8 - 23 mg/dL 28(H) 24(H) 26(H)  Creatinine 0.61 - 1.24 mg/dL 1.41(H) 1.05 1.29(H)  Sodium 135 - 145 mmol/L 137 143 142  Potassium 3.5 - 5.1 mmol/L 3.8 4.0 4.1  Chloride 98 -  111 mmol/L 104 109 109  CO2 22 - 32 mmol/L 24 25 24   Calcium 8.9 - 10.3 mg/dL 8.1(L) 8.6(L) 8.5(L)  Some fluctuation in range of 1.0-1.4, cont to monitor , no nephrotoxic meds identified , negative fluid balance with poor intake , this is likely contributing   14: Urinary retention/bloody clots: Foley cath placed 12/9. Will discontinue in AM. Orders in place for bladder scanning, PVR check and in/out cath if necessary  -blood clots in urine with caths d/t eliquis  -add flomax, bp can tolerate  12/22- still requiring in/out caths- bloody/clots- will check with Urology- if they think we need to place a 3 way foley? 15. Leukocytosis, has been bouncing from 7K to 12 K over the last 2 weeks  12.9 on 12/19  -afebrile   Elevated ESR and CRP Awiat hand MRI  -check U/A+, E coli >100K on Keflex, await sensitivities  12/22- will recheck in AM-U Cx Ecoli and E faecalis. Sensitive to Keflex-  16.RIght fourth digit swelling, x-rays show erosive changes likely chronic however given some elevation of white cells as well as elevated ESR and CRP would pursue MRI of the finger/hand right side, appreciate Ortho assistance   I spent a total of 36 minutes on total care- >50% coordination of care- d/w PA and reading over MRI issues and d/w nursing about bloody clots/caths. Will call Urology about possible 3 way catheter.    LOS: 7 days A FACE TO FACE EVALUATION WAS PERFORMED  Franceska Strahm 07/06/2021, 10:33 AM

## 2021-07-06 NOTE — Progress Notes (Signed)
[name]. Devon Elliott is in a very sleepy state due to Ativan that was prescribed in preparation for MRI. Medications have been held for now and Dois Davenport PA was contacted.    Wilfred Lacy LPN

## 2021-07-06 NOTE — Progress Notes (Signed)
Patient ID: Devon Elliott., male   DOB: September 01, 1926, 87 y.o.   MRN: [id number]  [facility] and HH resources provided to patient DIL

## 2021-07-06 NOTE — Consult Note (Signed)
Urology Consult   Physician requesting consult: Courtney Heys, MD  Reason for consult: Gross hematuria  History of Present Illness: Devon Elliott. is a 87 y.o. who is currently admitted on the rehab floor after being discharged on 06/29/2021 with a stroke.  Patient is mentally responsive with me today.  He is not able to answer questions.  Talk with his nurse, this is his baseline.  No family is at bedside.  Per report, he required in and out catheterizations.  During his catheterizations yesterday, was noted that he had some bloody urine with some questionable clots.  Urology was consulted to consider if he would need a larger bore catheter placement with potential bladder irrigation.  A 20 French Foley catheter was placed earlier today prior to my consultation.  His catheter is draining clear yellow urine.  There is no evidence of any blood in the catheter or back.  I did discuss with nursing staff and there has been no blood after catheter placement today.  Urine culture 07/03/2021 with greater than 100,000 E. coli and Enterococcus resistant to ampicillin.  He is currently on Keflex.  He is also on Eliquis given his recent stroke.  I see no prior urology notes in our office or in epic.  I am unclear of his prior urology history as he is unable to provide much conversation today.  Past Medical History:  Diagnosis Date   Complete tear of left rotator cuff 07/21/2014   Essential hypertension 04/19/2014   Impingement syndrome of left shoulder 07/21/2014   Left hip pain 02/08/2016   Left supraspinatus tenosynovitis 07/21/2014   Pure hypercholesterolemia 04/19/2014    Past Surgical History:  Procedure Laterality Date   ELBOW SURGERY  1995   ESOPHAGOGASTRODUODENOSCOPY N/A 05/01/2020   Procedure: ESOPHAGOGASTRODUODENOSCOPY (EGD);  Surgeon: Toledo, Benay Pike, MD;  Location: [facility] END[facility];  Service: Gastroenterology;  Laterality: N/A;   ESOPHAGOGASTRODUODENOSCOPY (EGD) WITH PROPOFOL N/A 05/31/2016    Procedure: ESOPHAGOGASTRODUODENOSCOPY (EGD) WITH PROPOFOL;  Surgeon: Jonathon Bellows, MD;  Location: [facility] ENDOSCOPY;  Service: Endoscopy;  Laterality: N/A;   IR CT HEAD LTD  06/19/2021   IR PERCUTANEOUS ART THROMBECTOMY/INFUSION INTRACRANIAL INC DIAG ANGIO  06/19/2021   IR US GUIDE VASC ACCESS RIGHT  06/19/2021   KYPHOPLASTY N/A 07/30/2019   Procedure: L1 KYPHOPLASTY;  Surgeon: Hessie Knows, MD;  Location: [facility] ORS;  Service: Orthopedics;  Laterality: N/A;   RADIOLOGY WITH ANESTHESIA N/A 06/19/2021   Procedure: IR WITH ANESTHESIA;  Surgeon: Radiologist, Medication, MD;  Location: [place];  Service: Radiology;  Laterality: N/A;    Current Hospital Medications:  Home Meds:  No current facility-administered medications on file prior to encounter.   Current Outpatient Medications on File Prior to Encounter  Medication Sig Dispense Refill   acetaminophen (TYLENOL) 500 MG tablet Take 1,000 mg by mouth every 8 (eight) hours as needed for mild pain.     apixaban (ELIQUIS) 5 MG TABS tablet Take 1 tablet (5 mg total) by mouth 2 (two) times daily. 60 tablet 0   atorvastatin (LIPITOR) 40 MG tablet Take 1 tablet (40 mg total) by mouth daily. 30 tablet 0   Cholecalciferol 25 MCG (1000 UT) capsule Take 1,000 Units by mouth daily.     divalproex (DEPAKOTE SPRINKLE) 125 MG capsule Take 1 capsule (125 mg total) by mouth every 8 (eight) hours. 90 capsule 0   memantine (NAMENDA) 5 MG tablet Take 5 mg by mouth every evening.     metoprolol tartrate (LOPRESSOR) 25 MG tablet Take  1 tablet (25 mg total) by mouth 2 (two) times daily. 60 tablet 0   pantoprazole (PROTONIX) 40 MG tablet Take 1 tablet (40 mg total) by mouth daily at 12 noon. 30 tablet 0   QUEtiapine (SEROQUEL) 25 MG tablet Take 1 tablet (25 mg total) by mouth at bedtime. 30 tablet 0   QUEtiapine (SEROQUEL) 25 MG tablet Take 1 tablet (25 mg total) by mouth daily at 6 (six) AM. 30 tablet 0     Scheduled Meds:  (feeding supplement) PROSource Plus  30 mL Oral  BID BM   apixaban  5 mg Oral BID   atorvastatin  40 mg Oral Daily   bethanechol  10 mg Oral TID   cephALEXin  500 mg Oral Q12H   cholecalciferol  1,000 Units Oral Daily   divalproex  125 mg Oral Q8H   memantine  5 mg Oral q morning   metoprolol tartrate  25 mg Oral BID   pantoprazole  40 mg Oral Q1200   QUEtiapine  25 mg Oral QHS   QUEtiapine  25 mg Oral Q0600   tamsulosin  0.4 mg Oral QPC supper   Continuous Infusions: PRN Meds:.acetaminophen, albuterol, alum & mag hydroxide-simeth, bisacodyl, guaiFENesin-dextromethorphan, lidocaine, polyethylene glycol, prochlorperazine **OR** prochlorperazine **OR** prochlorperazine, QUEtiapine, sodium phosphate  Allergies:  Allergies  Allergen Reactions   Ace Inhibitors     Other reaction(s): Kidney Disorder   Clarithromycin Hives   Etodolac     Other reaction(s): Kidney Disorder   Levofloxacin Swelling    Family History  Problem Relation Age of Onset   Pancreatic cancer Mother    Diabetes Father     Social History:  reports that he has quit smoking. He has never used smokeless tobacco. He reports current alcohol use. He reports that he does not use drugs.  ROS: A complete review of systems was performed.  All systems are negative except for pertinent findings as noted.  Physical Exam:  Vital signs in last 24 hours: Temp:  [97.8 F (36.6 C)-99 F (37.2 C)] 97.8 F (36.6 C) (12/22 1326) Pulse Rate:  [78-87] 78 (12/22 1326) Resp:  [14-18] 14 (12/22 1326) BP: (137-145)/(72-92) 139/92 (12/22 1326) SpO2:  [95 %-98 %] 98 % (12/22 1326) Constitutional:  Alert and oriented, No acute distress Cardiovascular: Regular rate and rhythm Respiratory: Normal respiratory effort, Lungs clear bilaterally GI: Abdomen is soft, nontender, nondistended, no abdominal masses GU: No CVA tenderness; Foley draining clear yellow urine Neurologic: Grossly intact, no focal deficits Psychiatric: Normal mood and affect  Laboratory Data:  Recent Labs     07/04/21 1012  WBC 12.0*  HGB 11.8*  HCT 35.7*  PLT 314    No results for input(s): NA, K, CL, GLUCOSE, BUN, CALCIUM, CREATININE in the last 72 hours.  Invalid input(s): CO3   No results found for this or any previous visit (from the past 24 hour(s)). Recent Results (from the past 240 hour(s))  Urine Culture     Status: Abnormal   Collection Time: 07/03/21 12:14 PM   Specimen: Urine, Catheterized  Result Value Ref Range Status   Specimen Description URINE, CATHETERIZED  Final   Special Requests   Final    NONE Performed at [facility], [address], [place], [place] [address]    Culture (A)  Final    >=100,000 COLONIES/mL ESCHERICHIA COLI 80,000 COLONIES/mL ENTEROCOCCUS FAECALIS    Report Status 07/06/2021 FINAL  Final   Organism ID, Bacteria ESCHERICHIA COLI (A)  Final  Organism ID, Bacteria ENTEROCOCCUS FAECALIS (A)  Final      Susceptibility   Escherichia coli - MIC*    AMPICILLIN >=32 RESISTANT Resistant     CEFAZOLIN <=4 SENSITIVE Sensitive     CEFEPIME <=0.12 SENSITIVE Sensitive     CEFTRIAXONE <=0.25 SENSITIVE Sensitive     CIPROFLOXACIN <=0.25 SENSITIVE Sensitive     GENTAMICIN <=1 SENSITIVE Sensitive     IMIPENEM <=0.25 SENSITIVE Sensitive     NITROFURANTOIN <=16 SENSITIVE Sensitive     TRIMETH/SULFA <=20 SENSITIVE Sensitive     AMPICILLIN/SULBACTAM 4 SENSITIVE Sensitive     PIP/TAZO <=4 SENSITIVE Sensitive     * >=100,000 COLONIES/mL ESCHERICHIA COLI   Enterococcus faecalis - MIC*    AMPICILLIN <=2 SENSITIVE Sensitive     NITROFURANTOIN <=16 SENSITIVE Sensitive     VANCOMYCIN 1 SENSITIVE Sensitive     * 80,000 COLONIES/mL ENTEROCOCCUS FAECALIS    Renal Function: Recent Labs    06/30/21 0505 07/03/21 0545  CREATININE 1.05 1.41*   Estimated Creatinine Clearance: 31.7 mL/min (A) (by C-G formula based on SCr of 1.41 mg/dL (H)).  Radiologic Imaging: MR HAND RIGHT WO CONTRAST  Result Date: 07/06/2021 CLINICAL DATA:  Septic arthritis  suspected.  Hand x-ray done. EXAM: MRI OF THE RIGHT HAND WITHOUT CONTRAST TECHNIQUE: Multiplanar, multisequence MR imaging of the right was performed. No intravenous contrast was administered. COMPARISON:  Radiograph dated June 30, 2021 FINDINGS: Exam is limited due to motion artifact in multiple sequences. Bones/Joint/Cartilage No appreciable joint effusion or marrow edema to suggest septic arthritis/osteomyelitis. There are subchondral cystic changes and irregularity of the fourth metacarpal head, likely representing chronic arthritis. Ligaments Limited evaluation due to motion.  No definite tear. Muscles and Tendons Flexor and extensor tendons are within normal limits. Soft tissues Mild subcutaneous soft tissue edema about the dorsum of the hand. No drainable fluid collection. IMPRESSION: 1. Subchondral cystic changes without bone marrow edema and joint effusion at the fourth metatarsophalangeal joint. No evidence of acute osteomyelitis/septic arthritis. 2. Mild subcutaneous soft tissue edema about the dorsum of the hand. No drainable fluid collection or abscess. Electronically Signed   By: Larose Hires D.O.   On: 07/06/2021 13:59    I independently reviewed the above imaging studies.  Impression/Recommendation Gross hematuria: Resolved.  20 French Foley catheter in place draining clear yellow urine.  This could have been related to prior traumatic attempts with clean intermittent catheters. 2.  UTI: Urine culture 07/03/2021 with greater than 100,000 E. coli and Enterococcus. 3. Incomplete bladder emptying: He is required intermittent catheterization is currently has a 20 [place] Foley cath in place  -Continue Keflex for UTI -Foley is draining clear yellow urine. -In the setting of UTI, I do not recommend any aggressive evaluation with CT hematuria protocol at this time.  Blood could have been from prior traumatic catheterization.  Urinalysis 07/03/2021 with greater than 50 red blood cells in the  setting of recent catheterization however 2 weeks ago on 06/19/2021 with no red blood cells present. -After he recovers from acute issues, would recheck urinalysis and if evidence of microscopic hematuria, this may warrant further investigation with CT hematuria protocol and potential cystoscopy.  However, given his functional status, he may opt to avoid these further diagnostic studies. -Void trial per primary team.  If he fails a void trial, would recommend replacing Foley catheter and repeating void trial in 2 weeks after he recovers from acute injury. -Please call with any questions.  Matt R. Wrigley Plasencia MD 07/06/2021, 3:48  PM  [facility]  Pager: [phone]   CC: Courtney Heys, MD

## 2021-07-06 NOTE — Progress Notes (Signed)
Patient returned from MRI after Ativan1 mg given for sedation. (See results below.) Attendant RN states patient too drowsy to take meds. (RR= 14, SaO2 = 98% RA). Advised to hold all meds except the following evening meds when patient is alert and can protect his airway: metoprolol, Eliquis, Keflex. Also discussed Foley cath insertion as ordered by Dr. Berline Chough this morning>>confirmed that urology placed this morning.    Narrative & Impression  CLINICAL DATA:  Septic arthritis suspected.  Hand x-ray done.   EXAM: MRI OF THE RIGHT HAND WITHOUT CONTRAST   TECHNIQUE: Multiplanar, multisequence MR imaging of the right was performed. No intravenous contrast was administered.   COMPARISON:  Radiograph dated June 30, 2021   FINDINGS: Exam is limited due to motion artifact in multiple sequences.   Bones/Joint/Cartilage   No appreciable joint effusion or marrow edema to suggest septic arthritis/osteomyelitis. There are subchondral cystic changes and irregularity of the fourth metacarpal head, likely representing chronic arthritis.   Ligaments   Limited evaluation due to motion.  No definite tear.   Muscles and Tendons   Flexor and extensor tendons are within normal limits.   Soft tissues   Mild subcutaneous soft tissue edema about the dorsum of the hand. No drainable fluid collection.   IMPRESSION: 1. Subchondral cystic changes without bone marrow edema and joint effusion at the fourth metatarsophalangeal joint. No evidence of acute osteomyelitis/septic arthritis. 2. Mild subcutaneous soft tissue edema about the dorsum of the hand. No drainable fluid collection or abscess.     Electronically Signed   By: Larose Hires D.O.   On: 07/06/2021 13:59

## 2021-07-06 NOTE — Progress Notes (Signed)
Speech Language Pathology Daily Session Note  Patient Details  Name: Devon Elliott. MRN: [id number] Date of Birth: 1927-06-27  Today's Date: 07/06/2021 SLP Individual Time: 5993-5701 SLP Individual Time Calculation (min): 30 min  Short Term Goals: Week 1: SLP Short Term Goal 1 (Week 1): Patient will consume current diet without overt s/sx of aspiration with min A verbal cues for use of swallowing compensatory strategies SLP Short Term Goal 2 (Week 1): Patient will demonstrate sustained attenion to functional tasks for 3 minutes with mod A verbal cues for redirection SLP Short Term Goal 3 (Week 1): Patient will follow basic one-step directions in 50% of opportunities with max A verbal/visual/modeling cues SLP Short Term Goal 4 (Week 1): Patient will point to objects/pictures in field of 3 with max A verbal/visual cues SLP Short Term Goal 5 (Week 1): Patient will answer biographical/environmental yes/no questions with 50% accuracy with max A multimodal cues  Skilled Therapeutic Interventions:   Patient seen for skilled ST session focusing on cognitive function goals. Of note, patient was reportedly agitated previous date and unable to complete scheduled MRI. When SLP entered room, patient sleeping and unable to adequately arouse until RN entered room and was able to get him to wake up and take his morning medications. Patient then able to maintain adequate alertness for safe PO intake but with frequent verbal and tactile cues to redirect attention. Patient able to manage cup to give self sips of water but had significant difficulty getting food onto fork, requiring SLP assistance. After a few bites of food, patient stopped eating and when asked he reported (with verbal cues due to expressive aphasia) that he would "finish lay" (later). Patient left in bed with all needs in reach and PT present for session. Patient continues to benefit from skilled SLP intervention to maximize  cognitive-linguistic and swallow function prior to discharge.   Pain Pain Assessment Pain Scale: Faces Pain Score: 0-No pain  Therapy/Group: Individual Therapy  Angela Nevin, MA, CCC-SLP Speech Therapy

## 2021-07-06 NOTE — Progress Notes (Signed)
Patient ID: Devon Freed., male   DOB: 04/27/1927, 87 y.o.   MRN: [id number]  ALF resources provided to patient family.

## 2021-07-07 LAB — CBC WITH DIFFERENTIAL/PLATELET
Abs Immature Granulocytes: 0.08 10*3/uL — ABNORMAL HIGH (ref 0.00–0.07)
Basophils Absolute: 0.1 10*3/uL (ref 0.0–0.1)
Basophils Relative: 1 %
Eosinophils Absolute: 0.1 10*3/uL (ref 0.0–0.5)
Eosinophils Relative: 1 %
HCT: 33.6 % — ABNORMAL LOW (ref 39.0–52.0)
Hemoglobin: 11 g/dL — ABNORMAL LOW (ref 13.0–17.0)
Immature Granulocytes: 1 %
Lymphocytes Relative: 8 %
Lymphs Abs: 0.7 10*3/uL (ref 0.7–4.0)
MCH: 29.8 pg (ref 26.0–34.0)
MCHC: 32.7 g/dL (ref 30.0–36.0)
MCV: 91.1 fL (ref 80.0–100.0)
Monocytes Absolute: 0.8 10*3/uL (ref 0.1–1.0)
Monocytes Relative: 9 %
Neutro Abs: 7.1 10*3/uL (ref 1.7–7.7)
Neutrophils Relative %: 80 %
Platelets: 283 10*3/uL (ref 150–400)
RBC: 3.69 MIL/uL — ABNORMAL LOW (ref 4.22–5.81)
RDW: 14.6 % (ref 11.5–15.5)
WBC: 8.7 10*3/uL (ref 4.0–10.5)
nRBC: 0 % (ref 0.0–0.2)

## 2021-07-07 LAB — BASIC METABOLIC PANEL
Anion gap: 9 (ref 5–15)
BUN: 32 mg/dL — ABNORMAL HIGH (ref 8–23)
CO2: 25 mmol/L (ref 22–32)
Calcium: 8.3 mg/dL — ABNORMAL LOW (ref 8.9–10.3)
Chloride: 110 mmol/L (ref 98–111)
Creatinine, Ser: 1.31 mg/dL — ABNORMAL HIGH (ref 0.61–1.24)
GFR, Estimated: 51 mL/min — ABNORMAL LOW (ref >60)
Glucose, Bld: 119 mg/dL — ABNORMAL HIGH (ref 70–99)
Potassium: 3.7 mmol/L (ref 3.5–5.1)
Sodium: 144 mmol/L (ref 135–145)

## 2021-07-07 MED ORDER — SODIUM CHLORIDE 0.9 % IV SOLN: INTRAVENOUS | Status: AC

## 2021-07-07 NOTE — Progress Notes (Signed)
Physical Therapy Weekly Progress Note  Patient Details  Name: Devon Elliott. MRN: [id number] Date of Birth: 1927/05/24  Beginning of progress report period: June 30, 2021 End of progress report period: July 07, 2021  Today's Date: 07/07/2021 PT Individual Time: 1005-1045 PT Individual Time Calculation (min): 40 min   Patient has met 2 of 3 short term goals.  Pt is making slow progress towards goals, but limited by fatigue, apraxia and receptive aphasia. Pt able to perform at min-mod assist with RW intermittently, but inconsistent depending on fatigue level. Family education planned to begin next week.   Patient continues to demonstrate the following deficits muscle weakness, muscle joint tightness, and muscle paralysis, decreased cardiorespiratoy endurance, motor apraxia and decreased coordination, field cut, decreased attention to right and right side neglect, decreased initiation, decreased attention, decreased awareness, decreased problem solving, decreased safety awareness, decreased memory, and delayed processing, central origin, and decreased sitting balance, decreased standing balance, decreased postural control, hemiplegia, and decreased balance strategies and therefore will continue to benefit from skilled PT intervention to increase functional independence with mobility.  Patient progressing toward long term goals..  Continue plan of care.  PT Short Term Goals Week 1:  PT Short Term Goal 1 (Week 1): Patient will complete bed mobility with CGA PT Short Term Goal 1 - Progress (Week 1): Not progressing PT Short Term Goal 2 (Week 1): Patient will complete bed <> wc transfers with LRAD and ModA x1 PT Short Term Goal 2 - Progress (Week 1): Met PT Short Term Goal 3 (Week 1): Patient will ambulate >66f with LRAD and MinA x1 PT Short Term Goal 3 - Progress (Week 1): Met Week 2:  PT Short Term Goal 1 (Week 2): Pt will ambulate 574fwith min assist and LRAD PT Short Term Goal  2 (Week 2): Pt will perform bed mobility with min assist consistenly PT Short Term Goal 3 (Week 2): Family education will be initiated PT Short Term Goal 4 (Week 2): Pt will remain out of bed >2 hours between therapies.  Skilled Therapeutic Interventions/Progress Updates:    Pt received supine in bed and agreeable to PT. Lower body dressing with mod assist in supine to pull pants to waist.  Supine>sit transfer with min assist and cues for use placement. Pt requiring assist at trunk to pull on PT to prevent pushing through RUE due to pain.   Reports need for BM. Ambulatory transfer to toilet with mod assist for facilitate improve forward progressing and contorl of RW. PT performed clothing management and pericare to clean smeer on from small BM. Ambulatory transfer to recliner with min-mod assist and RW. Noted to have shuffle gait pattern. Upper body dressin sitting in recliner with mod assist for positioning of shirt and then completing of buttoning shirt with max assist, as pt unable to perform due to UE weakness. Pt left sitting in recliner with call bell in reach and all needs met.         Therapy Documentation Precautions:  Precautions Precautions: Fall, Other (comment) Precaution Comments: SBP<200, aphasia, can be combative Restrictions Weight Bearing Restrictions: No  Pain: denies   Therapy/Group: Individual Therapy  AuLorie Phenix2/23/2022, 12:31 PM

## 2021-07-07 NOTE — Progress Notes (Signed)
Physical Therapy Session Note  Patient Details  Name: Devon Elliott. MRN: [id number] Date of Birth: 16-Aug-1926  Today's Date: 07/07/2021 PT Individual Time: 8916-9450 PT Individual Time Calculation (min): 55 min   Short Term Goals: Week 1:  PT Short Term Goal 1 (Week 1): Patient will complete bed mobility with CGA PT Short Term Goal 1 - Progress (Week 1): Not progressing PT Short Term Goal 2 (Week 1): Patient will complete bed <> wc transfers with LRAD and ModA x1 PT Short Term Goal 2 - Progress (Week 1): Met PT Short Term Goal 3 (Week 1): Patient will ambulate >63f with LRAD and MinA x1 PT Short Term Goal 3 - Progress (Week 1): Met Week 2:  PT Short Term Goal 1 (Week 2): Pt will ambulate 57fwith min assist and LRAD PT Short Term Goal 2 (Week 2): Pt will perform bed mobility with min assist consistenly PT Short Term Goal 3 (Week 2): Family education will be initiated PT Short Term Goal 4 (Week 2): Pt will remain out of bed >2 hours between therapies.  Skilled Therapeutic Interventions/Progress Updates:   Pt received supine in bed and agreeable to PT. With mx encouragement pt willing to get out of bed with mod assist from PT to achieve sitting EOB. [facility] perceive position of WC and refused to sit due to fear of falling. Pt then reports that he needs for have BM. Ambulatory transfer to toilet with mod assist for control of RW and improved step length due to shuffle pattern. Once in bathroom, pt removed pants, but refused to sit on toilet. Clothing management with max assist from PT. No BM noted. Ambulatory transfer to recliner with min assist and RW. Once in recliner, pt noted to be scooting close to edge of seat, due to concern of pt falling to floor, pt assisted pt to perform posterior scoot with mod assist. Stand pivot transfer to bed with min assist and RW. Sit>supine with min assist at the LLE. Pt left supine in bed with call bell in  reach and all need met. Pt noted to have extreme difficulty understanding instruction on this day.       Therapy Documentation Precautions:  Precautions Precautions: Fall, Other (comment) Precaution Comments: SBP<200, aphasia, can be combative Restrictions Weight Bearing Restrictions: No General:   Vital Signs: Therapy Vitals Temp: 98.3 F (36.8 C) Temp Source: Oral Pulse Rate: 95 Resp: 18 BP: (!) 148/87 Patient Position (if appropriate): Lying Oxygen Therapy SpO2: 98 % O2 Device: Room Air Pain: Pain Assessment Pain Scale: 0-10 Pain Score: 0-No pain Mobility:   Locomotion :    Trunk/Postural Assessment :    Balance:   Exercises:   Other Treatments:      Therapy/Group: Individual Therapy  AuLorie Phenix2/23/2022, 4:30 PM

## 2021-07-07 NOTE — Plan of Care (Addendum)
°  Problem: RH Expression Communication Goal: LTG Patient will increase word finding of common (SLP) Description: LTG:  Patient will increase word finding of common objects/daily info/abstract thoughts with cues using compensatory strategies (SLP). Outcome: Not Applicable Note: Goal discontinued to focus on other goals   Problem: RH Comprehension Communication Goal: LTG Patient will comprehend basic/complex auditory (SLP) Description: LTG: Patient will comprehend basic/complex auditory information with cues (SLP). Flowsheets (Taken 07/07/2021 1249) LTG: Patient will comprehend auditory information with cueing (SLP):  Moderate Assistance - Patient 50 - 74%  Maximal Assistance - Patient 25 - 49% Note: Goal modified due to slow progress and poor participation   Problem: RH Expression Communication Goal: LTG Patient will express needs/wants via multi-modal(SLP) Description: LTG:  Patient will express needs/wants via multi-modal communication (gestures/written, etc) with cues (SLP) Flowsheets (Taken 07/07/2021 1249) LTG: Patient will express needs/wants via multimodal communication (gestures/written, etc) with cueing (SLP):  Moderate Assistance - Patient 50 - 74%  Maximal Assistance - Patient 25 - 49% Note: Goal modified due to functional needs Goal: LTG Patient will verbally express basic/complex needs(SLP) Description: LTG:  Patient will verbally express basic/complex needs, wants or ideas with cues  (SLP) Flowsheets (Taken 07/07/2021 1249) LTG: Patient will verbally express basic/complex needs, wants or ideas (SLP):  Moderate Assistance - Patient 50 - 74%  Maximal Assistance - Patient 25 - 49% Note: Goal modified due to slow progress and poor participation   Problem: RH Attention Goal: LTG Patient will demonstrate this level of attention during functional activites (SLP) Description: LTG:  Patient will will demonstrate this level of attention during functional activites  (SLP) Flowsheets (Taken 07/07/2021 1249) LTG: Patient will demonstrate this level of attention during cognitive/linguistic activities with assistance of (SLP): Moderate Assistance - Patient 50 - 74% Note: Goal modified due to slow progress and poor participation

## 2021-07-07 NOTE — Progress Notes (Addendum)
Occupational Therapy Weekly Progress Note  Patient Details  Name: Devon Elliott. MRN: [id number] Date of Birth: [birth year]  Beginning of progress report period: June 30, 2021 End of progress report period: July 07, 2021  Today's Date: 07/07/2021 OT Missed Time: 60 Minutes Missed Time Reason: Patient fatigue   Patient has met 1 of 4 short term goals.  Pt has made poor progress this week towards LTG. Pt has demonstrated improved functional transfers, activity tolerance, ability to compensate for deficits to presently complete self-feeidng and grooming with S with LUE, but continues to require total A for remaining ADL. Able to complete stand-pivot transfers with min to mod A and RW. Pt cont to be primarily limited by increased confusion, lethargy, fear of falling, poor participation in therapy, decreased orientation and activity tolerance. Continues to guard RUE due to pain and use LUE dominantly for self-feeding and grooming. Anticipate 24/7 S and min to mod physical assist required upon DC.   Patient continues to demonstrate the following deficits: muscle weakness, decreased cardiorespiratoy endurance, impaired timing and sequencing, unbalanced muscle activation, decreased coordination, and decreased motor planning, decreased initiation, decreased attention, decreased awareness, decreased problem solving, decreased safety awareness, decreased memory, and delayed processing, and decreased sitting balance, decreased standing balance, decreased postural control, hemiplegia, and decreased balance strategies and therefore will continue to benefit from skilled OT intervention to enhance overall performance with BADL and Reduce care partner burden.  Patient not progressing toward long term goals.  See goal revision..  Plan of care revisions: Downgraded to mod A UB and LB ADL overall due to poor participation in therapy, min A bathroom transfers.  OT Short Term Goals Week 1:  OT Short Term  Goal 1 (Week 1): Pt will actively use RUE during self feeding with mod cues. OT Short Term Goal 1 - Progress (Week 1): Not met OT Short Term Goal 2 (Week 1): Pt will demonstrate improved praxis to bathe with min A. OT Short Term Goal 2 - Progress (Week 1): Not met OT Short Term Goal 3 (Week 1): Pt will don shirt with min A. OT Short Term Goal 3 - Progress (Week 1): Not met OT Short Term Goal 4 (Week 1): pt will sit to stand with min A to prepare for transfers. OT Short Term Goal 4 - Progress (Week 1): Not met OT Short Term Goal 5 (Week 1): pt will complete BSC transfers with mod A of 1. OT Short Term Goal 5 - Progress (Week 1): Met Week 2:  OT Short Term Goal 1 (Week 2): pt will sit to stand with min A to prepare for transfers. OT Short Term Goal 2 (Week 2): Pt will don shirt with max A. OT Short Term Goal 3 (Week 2): Pt will complete UB bathing with mod A. OT Short Term Goal 4 (Week 2): Pt will actively participate in 2/3 full therapy sessions.  Skilled Therapeutic Interventions/Progress Updates:    Session Attempt: Attempted to see pt for scheduled OT session. Unable to rouse despite max multimodal cuing and environmental adjustments (turning on lights, elevating HOB, etc.). Pt left semi-reclined in bed with bed alarm engaged, call bell in reach.  Session Attempt 2: Attempted to see pt in pm for missed minutes. Unable to rouse despite max multimodal cuing and use of warm washcloth to face. Did not open eyes.  Therapy Documentation Precautions:  Precautions Precautions: Fall, Other (comment) Precaution Comments: SBP<200, aphasia, can be combative Restrictions Weight Bearing Restrictions: No   Apolonio Schneiders A  Johnnye Sima MS, OTR/L  07/07/2021, 6:54 AM

## 2021-07-07 NOTE — Progress Notes (Signed)
PROGRESS NOTE   Subjective/Complaints:  Pt slept a lot yesterday due to IV ativan for MRI- woke briefly this AM- batted at my hand. .    ROS: limited due to sedation/language Objective:   MR HAND RIGHT WO CONTRAST  Result Date: 07/06/2021 CLINICAL DATA:  Septic arthritis suspected.  Hand x-ray done. EXAM: MRI OF THE RIGHT HAND WITHOUT CONTRAST TECHNIQUE: Multiplanar, multisequence MR imaging of the right was performed. No intravenous contrast was administered. COMPARISON:  Radiograph dated June 30, 2021 FINDINGS: Exam is limited due to motion artifact in multiple sequences. Bones/Joint/Cartilage No appreciable joint effusion or marrow edema to suggest septic arthritis/osteomyelitis. There are subchondral cystic changes and irregularity of the fourth metacarpal head, likely representing chronic arthritis. Ligaments Limited evaluation due to motion.  No definite tear. Muscles and Tendons Flexor and extensor tendons are within normal limits. Soft tissues Mild subcutaneous soft tissue edema about the dorsum of the hand. No drainable fluid collection. IMPRESSION: 1. Subchondral cystic changes without bone marrow edema and joint effusion at the fourth metatarsophalangeal joint. No evidence of acute osteomyelitis/septic arthritis. 2. Mild subcutaneous soft tissue edema about the dorsum of the hand. No drainable fluid collection or abscess. Electronically Signed   By: Keane Police D.O.   On: 07/06/2021 13:59   Recent Labs    07/07/21 0524  WBC 8.7  HGB 11.0*  HCT 33.6*  PLT 283   Recent Labs    07/07/21 0524  NA 144  K 3.7  CL 110  CO2 25  GLUCOSE 119*  BUN 32*  CREATININE 1.31*  CALCIUM 8.3*    Intake/Output Summary (Last 24 hours) at 07/07/2021 1025 Last data filed at 07/07/2021 0450 Gross per 24 hour  Intake 177 ml  Output 800 ml  Net -623 ml        Physical Exam: Vital Signs Blood pressure (!) 168/98, pulse 90,  temperature 97.6 F (36.4 C), temperature source Oral, resp. rate 14, weight 67.1 kg, SpO2 99 %.     General: sleepy- woke briefly; NAD HENT: conjugate gaze; oropharynx moist CV: regular rate; no JVD Pulmonary: CTA B/L; no W/R/R- good air movement GI: soft, NT, ND, (+)BS Psychiatric: sleepy Neurological: batted at my hand- aphasic Extremities: No clubbing, cyanosis, RIght 3rd and 4th finger edema- slightly improved   Skin: No evidence of breakdown, no evidence of rash , erythema RIght 4th digit Neurologic: aphasic, follows 25% or less of simple one step commands. Cranial nerves II through XII intact, motor strength is 5/5 in left and 3- RIght deltoid, bicep, tricep, grip, 5/5 bilateral hip flexor, knee extensors, ankle dorsiflexor and plantar flexor Musculoskeletal: pain with right fourth MCP compression as well as passive range of motion.  Swelling of right  fourth MCP extended to PIP chronic appearing arthritic changes in both hands    Assessment/Plan: 1. Functional deficits which require 3+ hours per day of interdisciplinary therapy in a comprehensive inpatient rehab setting. Physiatrist is providing close team supervision and 24 hour management of active medical problems listed below. Physiatrist and rehab team continue to assess barriers to discharge/monitor patient progress toward functional and medical goals  Care Tool:  Bathing    Body parts  bathed by patient: Chest, Face, Right upper leg, Left upper leg   Body parts bathed by helper: Right arm, Left arm, Front perineal area, Buttocks, Left lower leg, Right lower leg     Bathing assist Assist Level: Maximal Assistance - Patient 24 - 49%     Upper Body Dressing/Undressing Upper body dressing   What is the patient wearing?: Pull over shirt    Upper body assist Assist Level: Maximal Assistance - Patient 25 - 49%    Lower Body Dressing/Undressing Lower body dressing      What is the patient wearing?: Pants,  Incontinence brief     Lower body assist Assist for lower body dressing: Total Assistance - Patient < 25% (supine rolling)     Toileting Toileting    Toileting assist Assist for toileting: 2 Helpers     Transfers Chair/bed transfer  Transfers assist     Chair/bed transfer assist level: Moderate Assistance - Patient 50 - 74% Chair/bed transfer assistive device: Walker, Clinical biochemist   Ambulation assist      Assist level: 2 helpers (mod A of 1 and +2 w/c follow) Assistive device: Walker-rolling Max distance: 45f   Walk 10 feet activity   Assist     Assist level: 2 helpers Assistive device: Walker-rolling   Walk 50 feet activity   Assist Walk 50 feet with 2 turns activity did not occur: Safety/medical concerns         Walk 150 feet activity   Assist Walk 150 feet activity did not occur: Safety/medical concerns         Walk 10 feet on uneven surface  activity   Assist Walk 10 feet on uneven surfaces activity did not occur: Safety/medical concerns         Wheelchair     Assist Is the patient using a wheelchair?: Yes Type of Wheelchair: Manual    Wheelchair assist level: Maximal Assistance - Patient 25 - 49% Max wheelchair distance: 150    Wheelchair 50 feet with 2 turns activity    Assist        Assist Level: Maximal Assistance - Patient 25 - 49%   Wheelchair 150 feet activity     Assist      Assist Level: Maximal Assistance - Patient 25 - 49%   Blood pressure (!) 168/98, pulse 90, temperature 97.6 F (36.4 C), temperature source Oral, resp. rate 14, weight 67.1 kg, SpO2 99 %.  Medical Problem List and Plan: 1. Functional deficits secondary to left MCA infarct likely secondary to embolism Righ themiparesis and moderate global aphasia , suspect apraxia as well              -patient may  shower             -ELOS/Goals: 12-14d, team conf in am   Continue CIR- PT, OT and SLP 2.   Antithrombotics: -DVT/anticoagulation:  Pharmaceutical: Other (comment)  apixaban 5 mg BID             -antiplatelet therapy: none 3. Pain Management: Tylenol Added diclofenac gel to RIght hand - hand pain as per ortho suspect gout but septic arthritis in DDx await MRI RIght hand  12/22- pt combative- cannot get MRI- will d/w PA about sedation.   12/23- got MRI no septic arthritis- -w ill con't regimen 4. Mood: Depakote,              -antipsychotic agents: Seroquel 25 mg daily and q HS, Seroquel 25  mg q 6 hours PRN agitation;  Namenda 5. Neuropsych: This patient is not capable of making decisions on his own behalf. 6. Skin/Wound Care: routine skin checks 7. Fluids/Electrolytes/Nutrition: routine ins and outs with follow-up chemistries. Hypokalemia resolved. 8. Atrial fibrillation: rate controlled. Metoprolol 25 mg BID, Eliquis 5 mg daily 9. Hypertension: goal 569-794 systolic BP post thrombectomy. Continue metoprolol. Amlodipine added 12/13 Vitals:   07/06/21 1947 07/07/21 0500  BP: (!) 150/90 (!) 168/98  Pulse: 68 90  Resp: 20 14  Temp: 97.6 F (36.4 C) 97.6 F (36.4 C)  SpO2: 99%   12/19 improved bp control, HR up this am , has been below 100BPM thus far   12/23- BP a little elevated this AM- HR also 90s- monitor for trend.  10: Hyperlipidemia: LDL =  110; goal <70. Started on high intensity statin: atorvastatin 40 mg. Continue at discharge. 11: Delirium with history of mild dementia. Continue Depakote, Seroquel and Namenda Reduced comprehension associated with receptive language deficits are contributing  12: HFrEF: currently euvolemic 13: AKI: resolved. BUN/Cr trending back up   BMP Latest Ref Rng & Units 07/07/2021 07/03/2021 06/30/2021  Glucose 70 - 99 mg/dL 119(H) 120(H) 113(H)  BUN 8 - 23 mg/dL 32(H) 28(H) 24(H)  Creatinine 0.61 - 1.24 mg/dL 1.31(H) 1.41(H) 1.05  Sodium 135 - 145 mmol/L 144 137 143  Potassium 3.5 - 5.1 mmol/L 3.7 3.8 4.0  Chloride 98 - 111 mmol/L 110 104  109  CO2 22 - 32 mmol/L _0 Calcium 8.9 - 10.3 mg/dL 8.3(L) 8.1(L) 8.6(L)  Some fluctuation in range of 1.0-1.4, cont to monitor , no nephrotoxic meds identified , negative fluid balance with poor intake , this is likely contributing   12/23- IVFs for elevated Bun of 32- ~ 8-10 points above baseline since here- will recheck in AM  14: Urinary retention/bloody clots: Foley cath placed 12/9. Will discontinue in AM. Orders in place for bladder scanning, PVR check and in/out cath if necessary  -blood clots in urine with caths d/t eliquis  -add flomax, bp can tolerate  12/22- still requiring in/out caths- bloody/clots- will check with Urology- if they think we need to place a 3 way foley?  12/23- placed large foley- no blood clots- will monitor- since neede din/out caths prior- will give a few days, then remove foley.  15. Leukocytosis, has been bouncing from 7K to 12 K over the last 2 weeks  12.9 on 12/19  -afebrile  12/23- WBC down to 8.7   Elevated ESR and CRP Awiat hand MRI  -check U/A+, E coli >100K on Keflex, await sensitivities  12/22- will recheck in AM-U Cx Ecoli and E faecalis. Sensitive to Keflex-  16.RIght fourth digit swelling, x-rays show erosive changes likely chronic however given some elevation of white cells as well as elevated ESR and CRP would pursue MRI of the finger/hand right side, appreciate Ortho assistance  12/23- MRI (-) except mild subQ tissue edema    LOS: 8 days A FACE TO FACE EVALUATION WAS PERFORMED  Seng Larch 07/07/2021, 10:25 AM

## 2021-07-07 NOTE — Progress Notes (Signed)
Speech Language Pathology Daily Session Note  Patient Details  Name: Devon Elliott. MRN: [id number] Date of Birth: July 02, 1927  Today's Date: 07/07/2021 SLP Individual Time: 1450-1520 SLP Individual Time Calculation (min): 30 min  Short Term Goals: Week 2: SLP Short Term Goal 1 (Week 2): Patient will consume regular texture PO trials with timely and effective mastication, oral clearance, and without overt s/sx of aspiration with sup A verbal cues for swallow safety. SLP Short Term Goal 2 (Week 2): Patient will demonstrate sustained attenion to functional tasks for 3 minutes with max A verbal cues for redirection SLP Short Term Goal 3 (Week 2): Patient will follow basic one-step directions in 50% of opportunities with max A verbal/visual/modeling cues SLP Short Term Goal 4 (Week 2): Patient will answer yes/no questions regarding personal needs during 50% of occasions  with max A multimodal cues  Skilled Therapeutic Interventions: Skilled ST treatment focused on cognitive-communication and swallowing goals. Patient appeared much more alert and participative than previous encounter this a.m. SLP facilitated repositioning in bed prior to PO trials to maximize swallow safety. Patient accepted regular texture PO trials with timely and effective mastication, oral clearance with trace residue, and no overt s/sx of aspiration. Pt also consumed single sips of thin liquids by cup without overt s/sx of aspiration and clear vocal quality post swallows. Pt appears appropriate for regular texture trial tray and/or additional regular PO trial prior to diet advancement. Patient responded to yes/no questions regarding his personal preferences for snack/drinks, and comfort in bed during ~25% of occasions with mod A verbal repetition. He responded to some open ended questions with good clarity, although responses consisted of phonemic paraphasias and language of confusion when attempting to elaborate. SLP supported  pt in phone conversation with spouse by holding phone and providing intermittent verbal redirection and tactile stimulation to redirect attention. Patient was left in bed with alarm activated and immediate needs within reach at end of session. Continue per current plan of care.      Pain Pain Assessment Pain Scale: 0-10 Pain Score: 0-No pain  Therapy/Group: Individual Therapy  Tamala Ser 07/07/2021, 3:46 PM

## 2021-07-07 NOTE — Plan of Care (Signed)
°  Problem: RH Balance Goal: LTG Patient will maintain dynamic standing with ADLs (OT) Description: LTG:  Patient will maintain dynamic standing balance with assist during activities of daily living (OT)  Flowsheets (Taken 07/07/2021 0721) LTG: Pt will maintain dynamic standing balance during ADLs with: (Downgraded due to poor participation in therapy.) Minimal Assistance - Patient > 75% Note: Downgraded due to poor participation in therapy.   Problem: RH Grooming Goal: LTG Patient will perform grooming w/assist,cues/equip (OT) Description: LTG: Patient will perform grooming with assist, with/without cues using equipment (OT) Flowsheets (Taken 07/07/2021 0721) LTG: Pt will perform grooming with assistance level of: (Downgraded due to poor participation in therapy.) Supervision/Verbal cueing Note: Downgraded due to poor participation in therapy.   Problem: RH Bathing Goal: LTG Patient will bathe all body parts with assist levels (OT) Description: LTG: Patient will bathe all body parts with assist levels (OT) Flowsheets (Taken 07/07/2021 0721) LTG: Pt will perform bathing with assistance level/cueing: (Downgraded due to poor participation in therapy.) Moderate Assistance - Patient 50 - 74% Note: Downgraded due to poor participation in therapy.   Problem: RH Dressing Goal: LTG Patient will perform upper body dressing (OT) Description: LTG Patient will perform upper body dressing with assist, with/without cues (OT). Flowsheets (Taken 07/07/2021 0721) LTG: Pt will perform upper body dressing with assistance level of: (Downgraded due to poor participation in therapy.) Moderate Assistance - Patient 50 - 74% Note: Downgraded due to poor participation in therapy. Goal: LTG Patient will perform lower body dressing w/assist (OT) Description: LTG: Patient will perform lower body dressing with assist, with/without cues in positioning using equipment (OT) Flowsheets (Taken 07/07/2021 0721) LTG: Pt  will perform lower body dressing with assistance level of: (Downgraded due to poor participation in therapy.) Moderate Assistance - Patient 50 - 74% Note: Downgraded due to poor participation in therapy.   Problem: RH Toileting Goal: LTG Patient will perform toileting task (3/3 steps) with assistance level (OT) Description: LTG: Patient will perform toileting task (3/3 steps) with assistance level (OT)  Flowsheets (Taken 07/07/2021 0721) LTG: Pt will perform toileting task (3/3 steps) with assistance level: (Downgraded due to poor participation in therapy.) Moderate Assistance - Patient 50 - 74% Note: Downgraded due to poor participation in therapy.   Problem: RH Functional Use of Upper Extremity Goal: LTG Patient will use RT/LT upper extremity as a (OT) Description: LTG: Patient will use right/left upper extremity as a stabilizer/gross assist/diminished/nondominant/dominant level with assist, with/without cues during functional activity (OT) Flowsheets (Taken 07/07/2021 0721) LTG: Pt will use upper extremity in functional activity with assistance level of: (Downgraded due to poor participation in therapy.) Moderate Assistance - Patient 50 - 74% Note: Downgraded due to poor participation in therapy.   Problem: RH Toilet Transfers Goal: LTG Patient will perform toilet transfers w/assist (OT) Description: LTG: Patient will perform toilet transfers with assist, with/without cues using equipment (OT) Flowsheets (Taken 07/07/2021 0721) LTG: Pt will perform toilet transfers with assistance level of: (Downgraded due to poor participation in therapy.) Minimal Assistance - Patient > 75% Note: Downgraded due to poor participation in therapy.   Problem: RH Tub/Shower Transfers Goal: LTG Patient will perform tub/shower transfers w/assist (OT) Description: LTG: Patient will perform tub/shower transfers with assist, with/without cues using equipment (OT) Flowsheets (Taken 07/07/2021 0721) LTG: Pt will  perform tub/shower stall transfers with assistance level of: (Downgraded due to poor participation in therapy.) Moderate Assistance - Patient 50 - 74% Note: Downgraded due to poor participation in therapy.

## 2021-07-07 NOTE — Progress Notes (Signed)
Speech Language Pathology Weekly Progress and Session Note  Patient Details  Name: Boy Delamater. MRN: [id number] Date of Birth: 05-Feb-1927  Beginning of progress report period: June 30, 2021 End of progress report period: July 07, 2021  Today's Date: 07/07/2021 SLP Individual Time: 6712-4580 SLP Individual Time Calculation (min): 60 min  Short Term Goals: Week 1: SLP Short Term Goal 1 (Week 1): Patient will consume current diet without overt s/sx of aspiration with min A verbal cues for use of swallowing compensatory strategies SLP Short Term Goal 1 - Progress (Week 1): Met SLP Short Term Goal 2 (Week 1): Patient will demonstrate sustained attenion to functional tasks for 3 minutes with mod A verbal cues for redirection SLP Short Term Goal 2 - Progress (Week 1): Revised due to lack of progress SLP Short Term Goal 3 (Week 1): Patient will follow basic one-step directions in 50% of opportunities with max A verbal/visual/modeling cues SLP Short Term Goal 3 - Progress (Week 1): Not met SLP Short Term Goal 4 (Week 1): Patient will point to objects/pictures in field of 3 with max A verbal/visual cues SLP Short Term Goal 4 - Progress (Week 1): Discontinued (comment) (Discontinues due to minimal progress and focus on other goals) SLP Short Term Goal 5 (Week 1): Patient will answer biographical/environmental yes/no questions with 50% accuracy with max A multimodal cues SLP Short Term Goal 5 - Progress (Week 1): Revised due to lack of progress    New Short Term Goals: Week 2: SLP Short Term Goal 1 (Week 2): Patient will consume regular texture PO trials with timely and effective mastication, oral clearance, and without overt s/sx of aspiration with sup A verbal cues for swallow safety. SLP Short Term Goal 2 (Week 2): Patient will demonstrate sustained attenion to functional tasks for 3 minutes with max A verbal cues for redirection SLP Short Term Goal 3 (Week 2): Patient will follow  basic one-step directions in 50% of opportunities with max A verbal/visual/modeling cues SLP Short Term Goal 4 (Week 2): Patient will answer yes/no questions regarding personal needs during 50% of occasions  with max A multimodal cues  Weekly Progress Updates: Patient has made poor progress and has met 1 of 5 STGs this reporting period. Progress has been limited by confusion, disorientation, receptive language deficits, and significantly impaired attention skills which has limited functional participation and understanding of therapeutic tasks. Patient currently requires max-to-total assist for all structured cognitive and language tasks. He continues to exhibit expressive/receptive language deficits and is able to communicate only very basic needs which has been inconsistent. From a cognitive perspective, patient is slowly progressing with focused attention which has been main cognitive focus at this time. Patient is currently consuming a dysphagia 3 diet and thin liquids without overt s/sx of aspiration. Patient and family education is ongoing. Patient would benefit from continued skilled SLP intervention to maximize cognitive-communication, and swallow functioning and overall functional independence prior to discharge. Plan to modify plan of care with focus on comprehension and cognition at this time due to slow progress and limitations secondary to baseline cognitive deficits.   Intensity: Minumum of 1-2 x/day, 30 to 90 minutes Frequency: 3 to 5 out of 7 days Duration/Length of Stay: 1/3 Treatment/Interventions: Cognitive remediation/compensation;Environmental controls;Internal/external aids;Speech/Language facilitation;Cueing hierarchy;Multimodal communication approach;Dysphagia/aspiration precaution training;Functional tasks;Patient/family education;Therapeutic Activities   Daily Session Skilled Therapeutic Interventions: Skilled ST treatment focused on cognitive-communication goals. Patient was  lethargic this date and required max A verbal redirection for focused attention which was  limited to 30 seconds-to-1 minute in duration if he was not physically active during (i.e. transferring). Patient was total assist for object identification in field of 2, object naming, and following 1-step directions. Patient with no responses during these tasks and no observable attempts to participate. Patient received a gift from "[place]" during session as part of holiday activities and he did open the back and remove one of two items with  hand-over-hand assist. Pt became slightly more engaged with therapist when SLP played christmas music as evidenced by increased eye contact but for limited duration. Patient transferred to bed with +2 assist and followed 1-step commands with mod A verbal and visual cues. Patient was passed off to nsg care at end of session. Continue per current plan of care.       General    Pain  No pain   Therapy/Group: Individual Therapy  Patty Sermons 07/07/2021, 12:47 PM

## 2021-07-08 LAB — BASIC METABOLIC PANEL
Anion gap: 9 (ref 5–15)
BUN: 28 mg/dL — ABNORMAL HIGH (ref 8–23)
CO2: 22 mmol/L (ref 22–32)
Calcium: 7.9 mg/dL — ABNORMAL LOW (ref 8.9–10.3)
Chloride: 114 mmol/L — ABNORMAL HIGH (ref 98–111)
Creatinine, Ser: 1.09 mg/dL (ref 0.61–1.24)
GFR, Estimated: >60 mL/min (ref >60)
Glucose, Bld: 118 mg/dL — ABNORMAL HIGH (ref 70–99)
Potassium: 3.9 mmol/L (ref 3.5–5.1)
Sodium: 145 mmol/L (ref 135–145)

## 2021-07-08 MED ORDER — SODIUM CHLORIDE 0.45 % IV SOLN
INTRAVENOUS | Status: DC
  Administered 2021-07-15: 750 mL via INTRAVENOUS

## 2021-07-08 MED ORDER — PANTOPRAZOLE SODIUM 40 MG PO TBEC
40.0000 mg | DELAYED_RELEASE_TABLET | Freq: Every day | ORAL | Status: DC
  Administered 2021-07-08 – 2021-07-18 (×10): 40 mg via ORAL
  Filled 2021-07-08 (×11): qty 1

## 2021-07-08 NOTE — Progress Notes (Addendum)
PROGRESS NOTE   Subjective/Complaints:  No new issues this morning. A little slow to arouse  ROS: limited d/t language    Objective:   No results found. Recent Labs    07/07/21 0524  WBC 8.7  HGB 11.0*  HCT 33.6*  PLT 283   Recent Labs    07/07/21 0524 07/08/21 0515  NA 144 145  K 3.7 3.9  CL 110 114*  CO2 25 22  GLUCOSE 119* 118*  BUN 32* 28*  CREATININE 1.31* 1.09  CALCIUM 8.3* 7.9*    Intake/Output Summary (Last 24 hours) at 07/08/2021 1231 Last data filed at 07/08/2021 0417 Gross per 24 hour  Intake 200 ml  Output 660 ml  Net -460 ml        Physical Exam: Vital Signs Blood pressure (!) 163/94, pulse 96, temperature (!) 97.5 F (36.4 C), temperature source Oral, resp. rate 18, weight 67.1 kg, SpO2 98 %.     Constitutional: No distress . Vital signs reviewed. HEENT: NCAT, EOMI, oral membranes moist Neck: supple Cardiovascular: RRR without murmur. No JVD    Respiratory/Chest: CTA Bilaterally without wheezes or rales. Normal effort    GI/Abdomen: BS +, non-tender, non-distended Ext: no clubbing, cyanosis, or edema Psych: flat, slow to engage  Skin: No evidence of breakdown, no evidence of rash , erythema RIght 4th digit Neurologic: a little lethargic.aphasic, follows 25% or less of simple one step commands. Cranial nerves II through XII intact, motor strength is 5/5 in left and 3- RIght deltoid, bicep, tricep, grip, 5/5 bilateral hip flexor, knee extensors, ankle dorsiflexor and plantar flexor Musculoskeletal: right hand pain    Assessment/Plan: 1. Functional deficits which require 3+ hours per day of interdisciplinary therapy in a comprehensive inpatient rehab setting. Physiatrist is providing close team supervision and 24 hour management of active medical problems listed below. Physiatrist and rehab team continue to assess barriers to discharge/monitor patient progress toward functional and  medical goals  Care Tool:  Bathing    Body parts bathed by patient: Chest, Face, Right upper leg, Left upper leg   Body parts bathed by helper: Right arm, Left arm, Front perineal area, Buttocks, Left lower leg, Right lower leg     Bathing assist Assist Level: Maximal Assistance - Patient 24 - 49%     Upper Body Dressing/Undressing Upper body dressing   What is the patient wearing?: Pull over shirt    Upper body assist Assist Level: Maximal Assistance - Patient 25 - 49%    Lower Body Dressing/Undressing Lower body dressing      What is the patient wearing?: Pants, Incontinence brief     Lower body assist Assist for lower body dressing: Total Assistance - Patient < 25% (supine rolling)     Toileting Toileting    Toileting assist Assist for toileting: 2 Helpers     Transfers Chair/bed transfer  Transfers assist     Chair/bed transfer assist level: Moderate Assistance - Patient 50 - 74% Chair/bed transfer assistive device: Walker, Clinical biochemist   Ambulation assist      Assist level: 2 helpers (mod A of 1 and +2 w/c follow) Assistive device: Walker-rolling Max distance:  72f   Walk 10 feet activity   Assist     Assist level: 2 helpers Assistive device: Walker-rolling   Walk 50 feet activity   Assist Walk 50 feet with 2 turns activity did not occur: Safety/medical concerns         Walk 150 feet activity   Assist Walk 150 feet activity did not occur: Safety/medical concerns         Walk 10 feet on uneven surface  activity   Assist Walk 10 feet on uneven surfaces activity did not occur: Safety/medical concerns         Wheelchair     Assist Is the patient using a wheelchair?: Yes Type of Wheelchair: Manual    Wheelchair assist level: Maximal Assistance - Patient 25 - 49% Max wheelchair distance: 150    Wheelchair 50 feet with 2 turns activity    Assist        Assist Level: Maximal  Assistance - Patient 25 - 49%   Wheelchair 150 feet activity     Assist      Assist Level: Maximal Assistance - Patient 25 - 49%   Blood pressure (!) 163/94, pulse 96, temperature (!) 97.5 F (36.4 C), temperature source Oral, resp. rate 18, weight 67.1 kg, SpO2 98 %.  Medical Problem List and Plan: 1. 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  Antithrombotics: -DVT/anticoagulation:  Pharmaceutical: Other (comment)  apixaban 5 mg BID             -antiplatelet therapy: none 3. Pain Management: Tylenol Added diclofenac gel to RIght hand - hand pain as per ortho suspect gout but septic arthritis in DDx await MRI RIght hand  12/23- got MRI no septic arthritis- -will con't regimen 4. Mood: Depakote,              -antipsychotic agents: Seroquel 25 mg daily and q HS, Seroquel 25 mg q 6 hours PRN agitation;  Namenda 5. Neuropsych: This patient is not capable of making decisions on his own behalf. 6. Skin/Wound Care: routine skin checks 7. Fluids/Electrolytes/Nutrition: routine ins and outs with follow-up chemistries. Hypokalemia resolved. 8. Atrial fibrillation: rate controlled. Metoprolol 25 mg BID, Eliquis 5 mg daily 9. Hypertension: goal 1161-096systolic BP post thrombectomy. Continue metoprolol. Amlodipine added 12/13 Vitals:   07/07/21 1946 07/08/21 0414  BP: (!) 145/79 (!) 163/94  Pulse: 89 96  Resp: 18 18  Temp: 98.2 F (36.8 C) (!) 97.5 F (36.4 C)  SpO2: 96% 98%  12/24 borderline to fair control, SBP frequently elevated. Will increase lopressor to 37.532mbid   10: Hyperlipidemia: LDL =  110; goal <70. Started on high intensity statin: atorvastatin 40 mg. Continue at discharge. 11: Delirium with history of mild dementia. Continue Depakote,  Seroquel and Namenda Reduced comprehension associated with receptive language deficits are contributing  12: HFrEF: currently euvolemic 13: AKI: resolved. BUN/Cr trending back up   BMP Latest Ref Rng & Units 07/08/2021 07/07/2021 07/03/2021  Glucose 70 - 99 mg/dL 118(H) 119(H) 120(H)  BUN 8 - 23 mg/dL 28(H) 32(H) 28(H)  Creatinine 0.61 - 1.24 mg/dL 1.09 1.31(H) 1.41(H)  Sodium 135 - 145 mmol/L  145 144 137  Potassium 3.5 - 5.1 mmol/L 3.9 3.7 3.8  Chloride 98 - 111 mmol/L 114(H) 110 104  CO2 22 - 32 mmol/L 22 25 24   Calcium 8.9 - 10.3 mg/dL 7.9(L) 8.3(L) 8.1(L)  Some fluctuation in range of 1.0-1.4, cont to monitor , no nephrotoxic meds identified , negative fluid balance with poor intake , this is likely contributing   12/23- IVFs for elevated Bun of 32- ~ 8-10 points above baseline since here-    12/24 BUN/Cr better but still 28/1.1 today---resume HS fluids 1/2ns at 50cc/hr  14: Urinary retention/bloody clots: Foley cath placed 12/9. Will discontinue in AM. Orders in place for bladder scanning, PVR check and in/out cath if necessary  -blood clots in urine with caths d/t eliquis  -add flomax, bp can tolerate  12/22- still requiring in/out caths- bloody/clots- will check with Urology- if they think we need to place a 3 way foley?  12/24- foley in place, urine clear- continue foley thru weekend   -try voiding again next week? 15. Leukocytosis, has been bouncing from 7K to 12 K over the last 2 weeks  12.9 on 12/19  -afebrile  12/23- WBC down to 8.7  -  E coli >100K sens to Keflex     16.RIght fourth digit swelling, x-rays show erosive changes likely chronic however given some elevation of white cells as well as elevated ESR and CRP would pursue MRI of the finger/hand right side, appreciate Ortho assistance  12/23- MRI (-) except mild subQ tissue edema    LOS: 9 days A FACE TO FACE EVALUATION WAS PERFORMED  Meredith Staggers 07/08/2021, 12:31 PM

## 2021-07-08 NOTE — Progress Notes (Signed)
Physical Therapy Session Note  Patient Details  Name: Devon Elliott. MRN: [id number] Date of Birth: May 25, 1927  Today's Date: 07/08/2021 PT Individual Time: 1635-1730 PT Individual Time Calculation (min): 55 min   Short Term Goals: Week 1:  PT Short Term Goal 1 (Week 1): Patient will complete bed mobility with CGA PT Short Term Goal 1 - Progress (Week 1): Not progressing PT Short Term Goal 2 (Week 1): Patient will complete bed <> wc transfers with LRAD and ModA x1 PT Short Term Goal 2 - Progress (Week 1): Met PT Short Term Goal 3 (Week 1): Patient will ambulate >50f with LRAD and MinA x1 PT Short Term Goal 3 - Progress (Week 1): Met Week 2:  PT Short Term Goal 1 (Week 2): Pt will ambulate 521fwith min assist and LRAD PT Short Term Goal 2 (Week 2): Pt will perform bed mobility with min assist consistenly PT Short Term Goal 3 (Week 2): Family education will be initiated PT Short Term Goal 4 (Week 2): Pt will remain out of bed >2 hours between therapies.   Skilled Therapeutic Interventions/Progress Updates:   Pt received supine in bed.   Pt initially agrees to PT. PT removed and mits. Attempted assist to come to EOB and pt become resistant to movement repeating, " I just can't" PT encouraged movement but pt continued to resist. Rolling in bed with min assist to R and L x 1 each and then min assist to bring BIL LE to EOB, but unable to position BLE in position to allow transfer. PT obtained TIS WC to allow improved safety and sitting tolerance when out of bed as well as improved pressure relief. When PT returned pt continued to resist out of bed activity. Edcuation performed for need for mits to prevent pulling on Foley catheter given presence of UTI. PT notified RN that TIS WC in room for improved safety if pt agreeable to out of bed mobility. Pt  left supine in bed with call bell in reach, mittens in place, and all needs met.    Therapy Documentation Precautions:   Precautions Precautions: Fall, Other (comment) Precaution Comments: SBP<200, aphasia, can be combative Restrictions Weight Bearing Restrictions: No   Pain: Pain Assessment Pain Scale: Faces Pain Score: 0-No pain Faces Pain Scale: No hurt PAINAD (Pain Assessment in Advanced Dementia) Breathing: normal Negative Vocalization: none Facial Expression: smiling or inexpressive Body Language: relaxed Consolability: no need to console PAINAD Score: 0    Therapy/Group: Individual Therapy  AuLorie Phenix2/24/2022, 5:36 PM

## 2021-07-08 NOTE — Progress Notes (Signed)
Occupational Therapy Session Note  Patient Details  Name: Devon Elliott. MRN: [id number] Date of Birth: [birth year]  Today's Date: 07/08/2021 OT Individual Time: 1027-2536 OT Individual Time Calculation (min): 57 min    Short Term Goals: Week 1:  OT Short Term Goal 1 (Week 1): Pt will actively use RUE during self feeding with mod cues. OT Short Term Goal 1 - Progress (Week 1): Not met OT Short Term Goal 2 (Week 1): Pt will demonstrate improved praxis to bathe with min A. OT Short Term Goal 2 - Progress (Week 1): Not met OT Short Term Goal 3 (Week 1): Pt will don shirt with min A. OT Short Term Goal 3 - Progress (Week 1): Not met OT Short Term Goal 4 (Week 1): pt will sit to stand with min A to prepare for transfers. OT Short Term Goal 4 - Progress (Week 1): Not met OT Short Term Goal 5 (Week 1): pt will complete BSC transfers with mod A of 1. OT Short Term Goal 5 - Progress (Week 1): Met  Skilled Therapeutic Interventions/Progress Updates:    Pt received asleep in bed but easily awakened to voice and tactile cues this date, agreeable to therapy. Session focus on self-care retraining, activity tolerance, transfer retraining, command follow in prep for improved ADL/IADL/func mobility performance + decreased caregiver burden. Came to sitting EOB on his R with max A to progress BLE off bed and to lift trunk. R lean requiring mod A to maintain static sitting, but improving to CGA with BUE support on RW. Donned pants with total A to thread BLE and to pull over hips, pt able to assist by pulling pants up past knees. Short ambulatory transfer > recliner with min A to power up and to guide RW. Pt anteriorly slipping forward in recliner x2 requiring heavy assist of 3 to scoot hips back safely. Continued to slip anteriorly requiring max A to safely reaching standing and to return to seated EOB. Returned to side-lying with min A to progress BLE onto bed, + 2 to safely boost in bed. Pt unable to  follow directions to assist with boost. Able to drink from cup with S, but did not initiate self-feeding with breakfast tray in front of him. Noted to immediately fall asleep and tray removed from bedside. Continues to not incorporate RUE functionally due to pain, unable to rate pain.   Pt left semi-reclined in bed with bed alarm engaged, call bell in reach, and all immediate needs met.    Therapy Documentation Precautions:  Precautions Precautions: Fall, Other (comment) Precaution Comments: SBP<200, aphasia, can be combative Restrictions Weight Bearing Restrictions: No  Pain:   See session note ADL: See Care Tool for more details.  Therapy/Group: Individual Therapy  Volanda Napoleon MS, OTR/L  07/08/2021, 6:58 AM

## 2021-07-08 NOTE — Progress Notes (Signed)
Speech Language Pathology Daily Session Note  Patient Details  Name: Devon Elliott. MRN: [id number] Date of Birth: 10-Dec-1926  Today's Date: 07/08/2021 SLP Individual Time: 1450-1535 SLP Individual Time Calculation (min): 45 min  Short Term Goals: Week 2: SLP Short Term Goal 1 (Week 2): Patient will consume regular texture PO trials with timely and effective mastication, oral clearance, and without overt s/sx of aspiration with sup A verbal cues for swallow safety. SLP Short Term Goal 2 (Week 2): Patient will demonstrate sustained attenion to functional tasks for 3 minutes with max A verbal cues for redirection SLP Short Term Goal 3 (Week 2): Patient will follow basic one-step directions in 50% of opportunities with max A verbal/visual/modeling cues SLP Short Term Goal 4 (Week 2): Patient will answer yes/no questions regarding personal needs during 50% of occasions  with max A multimodal cues  Skilled Therapeutic Interventions: Skilled ST treatment focused on cognitive goals. Patient was received in bed this date and very restless. He was constantly fidgeting with sheets and rolling from side to side with attempts to exit bed throughout session. This distractibility interfered with participation in structured tasks. Patient was oriented to person. Difficult to assess orientation to person/place/time due to language deficits. When given field of 2 choices, pt stated he was at church. Patient occasionally responded to basic open-ended and yes/no questions, and made comments such as "that's nice" and "that's cool." He was echolalic at times. Many times he would either not respond, or responses consisted of semantic paraphasias or simply did not correlate to topic. Patient was able to count 1-5 with verbal/visual cues with written prompt on dry erase board. He required total A for counting to ten, pointing to target pictures in field of 2, and following 1-step directions. Toward end of session  patient expressed "I don't want to" and displayed some resistance toward therapist, especially with any attempts to re-adjust patient in bed. However, he was agreeable to consume coffee and listen to holiday music prior to conclusion of session. Patient was left in bed with alarm activated and immediate needs within reach at end of session. Continue per current plan of care.      Pain Pain Assessment Pain Scale: Faces Pain Score: 0-No pain Faces Pain Scale: No hurt PAINAD (Pain Assessment in Advanced Dementia) Breathing: normal Negative Vocalization: none Facial Expression: smiling or inexpressive Body Language: relaxed Consolability: no need to console PAINAD Score: 0  Therapy/Group: Individual Therapy  Tamala Ser 07/08/2021, 3:27 PM

## 2021-07-10 LAB — BASIC METABOLIC PANEL
Anion gap: 7 (ref 5–15)
BUN: 20 mg/dL (ref 8–23)
CO2: 20 mmol/L — ABNORMAL LOW (ref 22–32)
Calcium: 7.8 mg/dL — ABNORMAL LOW (ref 8.9–10.3)
Chloride: 114 mmol/L — ABNORMAL HIGH (ref 98–111)
Creatinine, Ser: 1.09 mg/dL (ref 0.61–1.24)
GFR, Estimated: >60 mL/min (ref >60)
Glucose, Bld: 106 mg/dL — ABNORMAL HIGH (ref 70–99)
Potassium: 3.8 mmol/L (ref 3.5–5.1)
Sodium: 141 mmol/L (ref 135–145)

## 2021-07-10 LAB — CBC
HCT: 30.9 % — ABNORMAL LOW (ref 39.0–52.0)
Hemoglobin: 9.7 g/dL — ABNORMAL LOW (ref 13.0–17.0)
MCH: 29.4 pg (ref 26.0–34.0)
MCHC: 31.4 g/dL (ref 30.0–36.0)
MCV: 93.6 fL (ref 80.0–100.0)
Platelets: 227 10*3/uL (ref 150–400)
RBC: 3.3 MIL/uL — ABNORMAL LOW (ref 4.22–5.81)
RDW: 14.6 % (ref 11.5–15.5)
WBC: 10.2 10*3/uL (ref 4.0–10.5)
nRBC: 0 % (ref 0.0–0.2)

## 2021-07-10 NOTE — Progress Notes (Signed)
Physical Therapy Session Note  Patient Details  Name: Devon Elliott. MRN: [id number] Date of Birth: 19-Feb-1927  Today's Date: 07/10/2021 PT Individual Time: 1448-1856 and 3149-7026 PT Individual Time Calculation (min): 30 min and 42 min  Short Term Goals: Week 2:  PT Short Term Goal 1 (Week 2): Pt will ambulate 5ft with min assist and LRAD PT Short Term Goal 2 (Week 2): Pt will perform bed mobility with min assist consistenly PT Short Term Goal 3 (Week 2): Family education will be initiated PT Short Term Goal 4 (Week 2): Pt will remain out of bed >2 hours between therapies.  Skilled Therapeutic Interventions/Progress Updates:  Patient asleep in bed on entrance to room. Patient easily roused initially, alert, and time spent orienting self to pt, and developing therapeutic alliance for ease of participation in session. Pt initially agreeable to session. No clothing located in room. Paper scrubs located and brought back to room. Pt again asleep. More difficult to rouse, but does wake for brief periods and willing to rise to get dressed however, continues to fall asleep becoming more difficult to rouse.    NT notified that PT will return for another session just before lunch.   Patient supine  in bed asleep at end of session with brakes locked, bed alarm set, and all needs within reach.    Session 2: Patient supine in bed asleep on entrance to room. Patient alert and agreeable to PT session. Significant aphasia and confusion noted throughout session. Pt requires constant engagement for participation. When providing assist, pt becomes agitated.   Patient with no pain complaint throughout session.  Therapeutic Activity: Bed Mobility: Patient performed supine <> sit with Mod/ MaxA and max cueing for initiation, sequencing and technique. Pt demos inability to follow instructions for initiating bringing BLE to EOB. Then requires near MaxA to reach upright seated position. Pt consistently  requesting to lie down. Pt reminded that wife in coming to visit this day and pt needs to get dressed.  Transfers:  Patient performed sit<>stand and stand pivot transfers initially this session with MinA and improving to CGA. Shuffling gait pattern noted with pivot stepping and pt preferring to move hands to w/c armrests rather than use walker. Pt requiring lateral scoot and posterior scoot requiring breakdown of required movements and initial pushback from pt requiring adjustment to simple cues with hand taps to targets. Provided vc/tc for positioning and technique.  Gait Training:  Patient ambulated 115 ft using RW with CGA. Demonstrated flexed forward posture with shuffling gait pattern. Provided vc/ tc for encouragement, attn to task as well as distraction in order to continue task. +2 available for w/c follow.   Neuromuscular Re-ed: NMR facilitated during session with focus on sitting/ standing balance. Pt guided in seated reaching to target objects needed for dressing on tray table in order to promote forward lean with control. In standing, pt guided in weight shifting  and minisquats in order to increase balance and proprioception of midline. NMR performed for improvements in motor control and coordination, balance, sequencing, judgement, and self confidence/ efficacy in performing all aspects of mobility at highest level of independence.   Patient seated upright in w/c at end of session with brakes locked, belt alarm set, and all needs within reach. NT present and initiating supervised lunch.   General: Pt missed overall 18 min of skilled therapy due to fatigue. Will re-attempt as schedule and pt availability permits.  Therapy Documentation Precautions:  Precautions Precautions: Fall, Other (comment) Precaution  Comments: SBP<200, aphasia, can be combative Restrictions Weight Bearing Restrictions: No  Pain: Pain Assessment Pain Scale: 0-10 Pain Score: 0-No pain  Therapy/Group:  Individual Therapy  Loel Dubonnet PT, DPT 07/10/2021, 9:28 AM

## 2021-07-10 NOTE — Progress Notes (Signed)
Occupational Therapy Session Note  Patient Details  Name: Devon Elliott. MRN: [id number] Date of Birth: June 28, 1927  Today's Date: 07/10/2021 OT Individual Time: 2094-7096 OT Individual Time Calculation (min): 39 min    Short Term Goals: Week 1:  OT Short Term Goal 1 (Week 1): Pt will actively use RUE during self feeding with mod cues. OT Short Term Goal 1 - Progress (Week 1): Not met OT Short Term Goal 2 (Week 1): Pt will demonstrate improved praxis to bathe with min A. OT Short Term Goal 2 - Progress (Week 1): Not met OT Short Term Goal 3 (Week 1): Pt will don shirt with min A. OT Short Term Goal 3 - Progress (Week 1): Not met OT Short Term Goal 4 (Week 1): pt will sit to stand with min A to prepare for transfers. OT Short Term Goal 4 - Progress (Week 1): Not met OT Short Term Goal 5 (Week 1): pt will complete BSC transfers with mod A of 1. OT Short Term Goal 5 - Progress (Week 1): Met Week 2:  OT Short Term Goal 1 (Week 2): pt will sit to stand with min A to prepare for transfers. OT Short Term Goal 2 (Week 2): Pt will don shirt with max A. OT Short Term Goal 3 (Week 2): Pt will complete UB bathing with mod A. OT Short Term Goal 4 (Week 2): Pt will actively participate in 2/3 full therapy sessions.  Skilled Therapeutic Interventions/Progress Updates:    Pt received asleep in bed, required increased tactile / verbal cues and time to rouse, agreeable to therapy. Session focus on self-care retraining, activity tolerance, transfer retraining in prep for improved ADL/IADL/func mobility performance + decreased caregiver burden. No s/sx pain throughout session and MD in/out morning assessment. R safety mitt noted to have broken leaking beads in bed. Came to sitting EOB on his R with max A to progress BLE off bed and to lift trunk. R lean sitting EOB requiring BUE support on RW. Short ambulatory transfer with mod A to power up and maintain balance > TIS with RW. No leg rests available in  room for TIS. Doffed hospital gown with total A and pt able to don new hospital gown with max A to thread BUE and to tie in back. Donned shorts with max A to thread BLE and to pull over hips, pt able to attempt threading and pull over knees this date to assist. Short ambulatory transfer > bed same manner as before, pt becoming confused and required increased cues to locate bed. Returned to supine with min A to progress RLE onto bed. Redonned L safety mitt, RN notified that R mitt needs to be replaced.  Pt left semi-reclined in bed with bed alarm engaged, call bell in reach, and all immediate needs met.    Therapy Documentation Precautions:  Precautions Precautions: Fall, Other (comment) Precaution Comments: SBP<200, aphasia, can be combative Restrictions Weight Bearing Restrictions: No  Pain: no s/sx throughout session   ADL: See Care Tool for more details.   Therapy/Group: Individual Therapy  Volanda Napoleon MS, OTR/L  07/10/2021, 6:50 AM

## 2021-07-10 NOTE — Progress Notes (Signed)
Speech Language Pathology Daily Session Note  Patient Details  Name: Devon Elliott. MRN: [id number] Date of Birth: August 06, 1926  Today's Date: 07/10/2021 SLP Individual Time: 1430-1530 SLP Individual Time Calculation (min): 60 min  Short Term Goals: Week 2: SLP Short Term Goal 1 (Week 2): Patient will consume regular texture PO trials with timely and effective mastication, oral clearance, and without overt s/sx of aspiration with sup A verbal cues for swallow safety. SLP Short Term Goal 2 (Week 2): Patient will demonstrate sustained attenion to functional tasks for 3 minutes with max A verbal cues for redirection SLP Short Term Goal 3 (Week 2): Patient will follow basic one-step directions in 50% of opportunities with max A verbal/visual/modeling cues SLP Short Term Goal 4 (Week 2): Patient will answer yes/no questions regarding personal needs during 50% of occasions  with max A multimodal cues  Skilled Therapeutic Interventions: Skilled ST treatment focused on cognitive-communication goals. Patient exhibited improved alertness and focused attention this date. He responded to questions appropriately approximately 25% of occasions with max A verbal repetition and/or rephrasing to comprehend. Patient was oriented to place and time (month only) given field of 2 choices. Pt pointed to field of 2 objects with 70% accuracy and max A verbal/visual cues. Pt sustained attention to today's tasks for 5 minute duration with max A verbal cues for redirection. Pt requested to speak with his wife on the phone. Pt required total A for recalling and dialing number. Pt initiated clear and appropriate greetings and again, responded to basic questions with significant verbal repetition. Communication exchange continues to be limited by comprehension deficits, word finding difficulty, and limited utterance length and accuracy. Pt demonstrated improved ability to seek clarification however by repeating questions to  communication partner in question form for validation which was effective on many occasions. When patient ended phone call he exhibited increased restlessness and confusion by expressing "I need to go for breakfast." Restlessness eventually resolved and patient appeared more calm by end of session. He still remains at high risk for falls due to cognitive and comprehension deficits that impact safety awareness and understanding of limitations. Patient was left in bed with alarm activated and immediate needs within reach at end of session. Continue per current plan of care.      Pain Pain Assessment Pain Scale: 0-10 Pain Score: 0-No pain  Therapy/Group: Individual Therapy  Tamala Ser 07/10/2021, 4:13 PM

## 2021-07-10 NOTE — Progress Notes (Signed)
PROGRESS NOTE   Subjective/Complaints:  Pt  denies pain- sitting up working on getting his pants on with OT- aphasic-   OQH:[id number] due to language    Objective:   No results found. Recent Labs    07/09/21 2348  WBC 10.2  HGB 9.7*  HCT 30.9*  PLT 227   Recent Labs    07/08/21 0515 07/09/21 2348  NA 145 141  K 3.9 3.8  CL 114* 114*  CO2 22 20*  GLUCOSE 118* 106*  BUN 28* 20  CREATININE 1.09 1.09  CALCIUM 7.9* 7.8*    Intake/Output Summary (Last 24 hours) at 07/10/2021 1053 Last data filed at 07/10/2021 0800 Gross per 24 hour  Intake 810 ml  Output 825 ml  Net -15 ml        Physical Exam: Vital Signs Blood pressure (!) 137/99, pulse (!) 104, temperature 97.9 F (36.6 C), resp. rate 18, weight 67.1 kg, SpO2 93 %.     Constitutional: No distress . Vital signs reviewed. HEENT: NCAT, EOMI, oral membranes moist Neck: supple Cardiovascular: RRR without murmur. No JVD    Respiratory/Chest: CTA Bilaterally without wheezes or rales. Normal effort    GI/Abdomen: BS +, non-tender, non-distended Ext: no clubbing, cyanosis, or edema Psych: flat, slow to engage  Skin: No evidence of breakdown, no evidence of rash , erythema RIght 4th digit Neurologic: a little lethargic.aphasic, follows 25% or less of simple one step commands. Cranial nerves II through XII intact, motor strength is 5/5 in left and 3- RIght deltoid, bicep, tricep, grip, 5/5 bilateral hip flexor, knee extensors, ankle dorsiflexor and plantar flexor Musculoskeletal: right hand pain    Assessment/Plan: 1. Functional deficits which require 3+ hours per day of interdisciplinary therapy in a comprehensive inpatient rehab setting. Physiatrist is providing close team supervision and 24 hour management of active medical problems listed below. Physiatrist and rehab team continue to assess barriers to discharge/monitor patient progress toward functional  and medical goals  Care Tool:  Bathing    Body parts bathed by patient: Chest, Face, Right upper leg, Left upper leg   Body parts bathed by helper: Right arm, Left arm, Front perineal area, Buttocks, Left lower leg, Right lower leg     Bathing assist Assist Level: Maximal Assistance - Patient 24 - 49%     Upper Body Dressing/Undressing Upper body dressing   What is the patient wearing?: Pull over shirt    Upper body assist Assist Level: Maximal Assistance - Patient 25 - 49%    Lower Body Dressing/Undressing Lower body dressing      What is the patient wearing?: Pants, Incontinence brief     Lower body assist Assist for lower body dressing: Total Assistance - Patient < 25% (supine rolling)     Toileting Toileting    Toileting assist Assist for toileting: 2 Helpers     Transfers Chair/bed transfer  Transfers assist     Chair/bed transfer assist level: Moderate Assistance - Patient 50 - 74% Chair/bed transfer assistive device: Walker, Clinical biochemist   Ambulation assist      Assist level: 2 helpers (mod A of 1 and +2 w/c follow) Assistive device:  Walker-rolling Max distance: 57f   Walk 10 feet activity   Assist     Assist level: 2 helpers Assistive device: Walker-rolling   Walk 50 feet activity   Assist Walk 50 feet with 2 turns activity did not occur: Safety/medical concerns         Walk 150 feet activity   Assist Walk 150 feet activity did not occur: Safety/medical concerns         Walk 10 feet on uneven surface  activity   Assist Walk 10 feet on uneven surfaces activity did not occur: Safety/medical concerns         Wheelchair     Assist Is the patient using a wheelchair?: Yes Type of Wheelchair: Manual    Wheelchair assist level: Maximal Assistance - Patient 25 - 49% Max wheelchair distance: 150    Wheelchair 50 feet with 2 turns activity    Assist        Assist Level: Maximal  Assistance - Patient 25 - 49%   Wheelchair 150 feet activity     Assist      Assist Level: Maximal Assistance - Patient 25 - 49%   Blood pressure (!) 137/99, pulse (!) 104, temperature 97.9 F (36.6 C), resp. rate 18, weight 67.1 kg, SpO2 93 %.  Medical Problem List and Plan: 1. 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  Antithrombotics: -DVT/anticoagulation:  Pharmaceutical: Other (comment)  apixaban 5 mg BID             -antiplatelet therapy: none 3. Pain Management: Tylenol Added diclofenac gel to RIght hand - hand pain as per ortho suspect gout but septic arthritis in DDx await MRI RIght hand  12/23- got MRI no septic arthritis- -will con't regimen 4. Mood: Depakote,              -antipsychotic agents: Seroquel 25 mg daily and q HS, Seroquel 25 mg q 6 hours PRN agitation;  Namenda 5. Neuropsych: This patient is not capable of making decisions on his own behalf. 6. Skin/Wound Care: routine skin checks 7. Fluids/Electrolytes/Nutrition: routine ins and outs with follow-up chemistries. Hypokalemia resolved. 8. Atrial fibrillation: rate controlled. Metoprolol 25 mg BID, Eliquis 5 mg daily 9. Hypertension: goal 1735-329systolic BP post thrombectomy. Continue metoprolol. Amlodipine added 12/13 Vitals:   07/09/21 1948 07/10/21 0457  BP: 130/75 (!) 137/99  Pulse: 84 (!) 104  Resp: 16 18  Temp: 98.3 F (36.8 C) 97.9 F (36.6 C)  SpO2: 95% 93%  12/24 borderline to fair control, SBP frequently elevated. Will increase lopressor to 37.545mbid   10: Hyperlipidemia: LDL =  110; goal <70. Started on high intensity statin: atorvastatin 40 mg. Continue at discharge. 11: Delirium with history of mild dementia. Continue Depakote, Seroquel and Namenda Reduced  comprehension associated with receptive language deficits are contributing  12: HFrEF: currently euvolemic 13: AKI: resolved. BUN/Cr trending back up   BMP Latest Ref Rng & Units 07/09/2021 07/08/2021 07/07/2021  Glucose 70 - 99 mg/dL 106(H) 118(H) 119(H)  BUN 8 - 23 mg/dL 20 28(H) 32(H)  Creatinine 0.61 - 1.24 mg/dL 1.09 1.09 1.31(H)  Sodium 135 - 145 mmol/L 141  145 144  Potassium 3.5 - 5.1 mmol/L 3.8 3.9 3.7  Chloride 98 - 111 mmol/L 114(H) 114(H) 110  CO2 22 - 32 mmol/L 20(L) 22 25  Calcium 8.9 - 10.3 mg/dL 7.8(L) 7.9(L) 8.3(L)  Some fluctuation in range of 1.0-1.4, cont to monitor , no nephrotoxic meds identified , negative fluid balance with poor intake , this is likely contributing   12/23- IVFs for elevated Bun of 32- ~ 8-10 points above baseline since here-    12/24 BUN/Cr better but still 28/1.1 today---resume HS fluids 1/2ns at 50cc/hr  12/26- BUN 20 and Cr 1.09- con't HS at night.  14: Urinary retention/bloody clots: Foley cath placed 12/9. Will discontinue in AM. Orders in place for bladder scanning, PVR check and in/out cath if necessary  -blood clots in urine with caths d/t eliquis  -add flomax, bp can tolerate  12/22- still requiring in/out caths- bloody/clots- will check with Urology- if they think we need to place a 3 way foley?  12/26- pt wasn't voiding at all- prior to placing catheter- wife want sus to keep foley- not do in/out caths.  15. Leukocytosis, has been bouncing from 7K to 12 K over the last 2 weeks  12.9 on 12/19  -afebrile  12/23- WBC down to 8.7  -  E coli >100K sens to Keflex     16.RIght fourth digit swelling, x-rays show erosive changes likely chronic however given some elevation of white cells as well as elevated ESR and CRP would pursue MRI of the finger/hand right side, appreciate Ortho assistance  12/23- MRI (-) except mild subQ tissue edema   I spent a total 44 minutes on total care- >50%coordination of care- d/w pt's wife about foley- vs in/out  caths- as well as hospital bed; and overall care. Tried to call son- no answer.     LOS: 11 days A FACE TO FACE EVALUATION WAS PERFORMED  Kjersti Dittmer 07/10/2021, 10:53 AM

## 2021-07-11 MED ORDER — METOPROLOL TARTRATE 50 MG PO TABS
50.0000 mg | ORAL_TABLET | Freq: Two times a day (BID) | ORAL | Status: DC
  Administered 2021-07-11 – 2021-07-18 (×13): 50 mg via ORAL
  Filled 2021-07-11 (×15): qty 1

## 2021-07-11 NOTE — Discharge Instructions (Addendum)
Inpatient Rehab Discharge Instructions  Devon Elliott. Discharge date and time: 07/18/2021  Activities/Precautions/ Functional Status: Activity: no lifting, driving, or strenuous exercise until cleared by MD Diet: cardiac diet Wound Care: none needed Functional status:  ___ No restrictions     ___ Walk up steps independently _x__ 24/7 supervision/assistance   ___ Walk up steps with assistance ___ Intermittent supervision/assistance  ___ Bathe/dress independently ___ Walk with walker     __x_ Bathe/dress with assistance ___ Walk Independently    ___ Shower independently __x_ Walk with assistance    ___ Shower with assistance __x_ No alcohol     ___ Return to work/school ________   STROKE/TIA DISCHARGE INSTRUCTIONS SMOKING Cigarette smoking nearly doubles your risk of having a stroke & is the single most alterable risk factor  If you smoke or have smoked in the last 12 months, you are advised to quit smoking for your health. Most of the excess cardiovascular risk related to smoking disappears within a year of stopping. Ask you doctor about anti-smoking medications [place] Quit Line: 1-800-QUIT NOW Free Smoking Cessation Classes [phone]  CHOLESTEROL Know your levels; limit fat & cholesterol in your diet  Lipid Panel     Component Value Date/Time   CHOL 198 06/20/2021 0359   TRIG 44 06/20/2021 0359   HDL 79 06/20/2021 0359   CHOLHDL 2.5 06/20/2021 0359   VLDL 9 06/20/2021 0359   LDLCALC 110 (H) 06/20/2021 0359     Many patients benefit from treatment even if their cholesterol is at goal. Goal: Total Cholesterol (CHOL) less than 160 Goal:  Triglycerides (TRIG) less than 150 Goal:  HDL greater than 40 Goal:  LDL (LDLCALC) less than 100   BLOOD PRESSURE American Stroke Association blood pressure target is less that 120/80 mm/Hg  Your discharge blood pressure is:  BP: (!) 146/95 Monitor your blood pressure Limit your salt and alcohol intake Many individuals will require  more than one medication for high blood pressure  DIABETES (A1c is a blood sugar average for last 3 months) Goal HGBA1c is under 7% (HBGA1c is blood sugar average for last 3 months)  Diabetes: No known diagnosis of diabetes    Lab Results  Component Value Date   HGBA1C 5.3 06/20/2021    Your HGBA1c can be lowered with medications, healthy diet, and exercise. Check your blood sugar as directed by your physician Call your physician if you experience unexplained or low blood sugars.  PHYSICAL ACTIVITY/REHABILITATION Goal is 30 minutes at least 4 days per week  Activity:  progress slowly Therapies: see below Return to work: n/a Activity decreases your risk of heart attack and stroke and makes your heart stronger.  It helps control your weight and blood pressure; helps you relax and can improve your mood. Participate in a regular exercise program. Talk with your doctor about the best form of exercise for you (dancing, walking, swimming, cycling).  DIET/WEIGHT Goal is to maintain a healthy weight  Your discharge diet is:  Diet Order             Diet regular Room service appropriate? Yes; Fluid consistency: Thin  Diet effective now                  thin liquids Your height is:  5'9"  Your current weight is: Weight: 67.1 kg Your Body Mass Index (BMI) is:  BMI (Calculated): 21.84 Following the type of diet specifically designed for you will help prevent another stroke. You are at  your goal weight: 147.1 lbs Your goal Body Mass Index (BMI) is 19-24. Healthy food habits can help reduce 3 risk factors for stroke:  High cholesterol, hypertension, and excess weight.  RESOURCES Stroke/Support Group:  Call [phone]   STROKE EDUCATION PROVIDED/REVIEWED AND GIVEN TO PATIENT Stroke warning signs and symptoms How to activate emergency medical system (call 911). Medications prescribed at discharge. Need for follow-up after discharge. Personal risk factors for stroke. Pneumonia vaccine  given: No Flu vaccine given: No My questions have been answered, the writing is legible, and I understand these instructions.  I will adhere to these goals & educational materials that have been provided to me after my discharge from the hospital.      COMMUNITY REFERRALS UPON DISCHARGE:    Home Health:   PT     OT     ST                   Agency: [facility] Phone: [phone]    Medical Equipment/Items Ordered: Hospital Bed and Bedside Commode                                                 Agency/Supplier: [employer]   Special Instructions: Medications need to be crushed and administer with pudding or applesauce. 2. No driving, alcohol consumption or tobacco use.  3. Foley care twice a day.  STROKE/TIA DISCHARGE INSTRUCTIONS SMOKING Cigarette smoking nearly doubles your risk of having a stroke & is the single most alterable risk factor  If you smoke or have smoked in the last 12 months, you are advised to quit smoking for your health. Most of the excess cardiovascular risk related to smoking disappears within a year of stopping. Ask you doctor about anti-smoking medications [place] Quit Line: 1-800-QUIT NOW Free Smoking Cessation Classes [phone]  CHOLESTEROL Know your levels; limit fat & cholesterol in your diet  Lipid Panel     Component Value Date/Time   CHOL 198 06/20/2021 0359   TRIG 44 06/20/2021 0359   HDL 79 06/20/2021 0359   CHOLHDL 2.5 06/20/2021 0359   VLDL 9 06/20/2021 0359   LDLCALC 110 (H) 06/20/2021 0359     Many patients benefit from treatment even if their cholesterol is at goal. Goal: Total Cholesterol (CHOL) less than 160 Goal:  Triglycerides (TRIG) less than 150 Goal:  HDL greater than 40 Goal:  LDL (LDLCALC) less than 100   BLOOD PRESSURE American Stroke Association blood pressure target is less that 120/80 mm/Hg  Your discharge blood pressure is:  BP: 146/85 Monitor your blood pressure Limit your salt and alcohol intake Many individuals will require more  than one medication for high blood pressure  DIABETES (A1c is a blood sugar average for last 3 months) Goal HGBA1c is under 7% (HBGA1c is blood sugar average for last 3 months)  Diabetes: No known diagnosis of diabetes    Lab Results  Component Value Date   HGBA1C 5.3 06/20/2021    Your HGBA1c can be lowered with medications, healthy diet, and exercise. Check your blood sugar as directed by your physician Call your physician if you experience unexplained or low blood sugars.  PHYSICAL ACTIVITY/REHABILITATION Goal is 30 minutes at least 4 days per week  Activity: No driving, Therapies: See above. Return to work: n/a Activity decreases your risk of heart attack and stroke and makes  your heart stronger.  It helps control your weight and blood pressure; helps you relax and can improve your mood. Participate in a regular exercise program. Talk with your doctor about the best form of exercise for you (dancing, walking, swimming, cycling).  DIET/WEIGHT Goal is to maintain a healthy weight  Your discharge diet is:  Diet Order             Diet regular Room service appropriate? Yes; Fluid consistency: Thin  Diet effective now                  thin liquids Your height is:  5\' 9"   Your current weight is: Weight: 67.1 kg = 147.4 lbs Your Body Mass Index (BMI) is:  BMI (Calculated): 21.84 Following the type of diet specifically designed for you will help prevent another stroke. You are at goal weight = 147.4 lbs Your goal Body Mass Index (BMI) is 19-24. Healthy food habits can help reduce 3 risk factors for stroke:  High cholesterol, hypertension, and excess weight.  RESOURCES Stroke/Support Group:  Call [phone]   STROKE EDUCATION PROVIDED/REVIEWED AND GIVEN TO PATIENT Stroke warning signs and symptoms How to activate emergency medical system (call 911). Medications prescribed at discharge. Need for follow-up after discharge. Personal risk factors for stroke. Pneumonia vaccine  given: No Flu vaccine given: No My questions have been answered, the writing is legible, and I understand these instructions.  I will adhere to these goals & educational materials that have been provided to me after my discharge from the hospital.     This medication education was reviewed with me or my healthcare representative as part of my discharge preparation.  Why was Eliquis prescribed for you? Eliquis was prescribed for you to reduce the risk of a blood clot forming that can cause a stroke if you have a medical condition called atrial fibrillation (a type of irregular heartbeat).  What do You need to know about Eliquis ? Take your Eliquis TWICE DAILY - one tablet in the morning and one tablet in the evening with or without food. If you have difficulty swallowing the tablet whole please discuss with your pharmacist how to take the medication safely.  Take Eliquis exactly as prescribed by your doctor and DO NOT stop taking Eliquis without talking to the doctor who prescribed the medication.  Stopping may increase your risk of developing a stroke.  Refill your prescription before you run out.  After discharge, you should have regular check-up appointments with your healthcare provider that is prescribing your Eliquis.  In the future your dose may need to be changed if your kidney function or weight changes by a significant amount or as you get older.  What do you do if you miss a dose? If you miss a dose, take it as soon as you remember on the same day and resume taking twice daily.  Do not take more than one dose of ELIQUIS at the same time to make up a missed dose.  Important Safety Information A possible side effect of Eliquis is bleeding. You should call your healthcare provider right away if you experience any of the following: Bleeding from an injury or your nose that does not stop. Unusual colored urine (red or dark brown) or unusual colored stools (red or black). Unusual  bruising for unknown reasons. A serious fall or if you hit your head (even if there is no bleeding).  Some medicines may interact with Eliquis and might increase your risk  of bleeding or clotting while on Eliquis. To help avoid this, consult your healthcare provider or pharmacist prior to using any new prescription or non-prescription medications, including herbals, vitamins, non-steroidal anti-inflammatory drugs (NSAIDs) and supplements.  This website has more information on Eliquis (apixaban): http://www.eliquis.com/eliquis/home  My questions have been answered and I understand these instructions. I will adhere to these goals and the provided educational materials after my discharge from the hospital.  Patient/Caregiver Signature _______________________________ Date __________  Clinician Signature _______________________________________ Date __________  Please bring this form and your medication list with you to all your follow-up doctor's appointments.

## 2021-07-11 NOTE — Progress Notes (Addendum)
Patient ID: Devon Freed., male   DOB: Dec 04, 1926, 87 y.o.   MRN: [id number]  Patient [facility] referral sent to [facility]  *Patient approved.

## 2021-07-11 NOTE — Progress Notes (Signed)
Physical Therapy Session Note  Patient Details  Name: Devon Elliott. MRN: [id number] Date of Birth: 01-09-1927  Today's Date: 07/11/2021 PT Individual Time: 8921-1941 PT Individual Time Calculation (min): 73 min   Short Term Goals: Week 1:  PT Short Term Goal 1 (Week 1): Patient will complete bed mobility with CGA PT Short Term Goal 1 - Progress (Week 1): Not progressing PT Short Term Goal 2 (Week 1): Patient will complete bed <> wc transfers with LRAD and ModA x1 PT Short Term Goal 2 - Progress (Week 1): Met PT Short Term Goal 3 (Week 1): Patient will ambulate >81f with LRAD and MinA x1 PT Short Term Goal 3 - Progress (Week 1): Met Week 2:  PT Short Term Goal 1 (Week 2): Pt will ambulate 544fwith min assist and LRAD PT Short Term Goal 2 (Week 2): Pt will perform bed mobility with min assist consistenly PT Short Term Goal 3 (Week 2): Family education will be initiated PT Short Term Goal 4 (Week 2): Pt will remain out of bed >2 hours between therapies.  Skilled Therapeutic Interventions/Progress Updates:  Patient agitated and standing with SLP next to bed on entrance to room. Patient alert but confused and focused on items at bedside (phone, call bell, stuffed animal) and trying to put them in his pockets. Took over session from SLP. Redirected pt to go for walk with therapist.  Patient with no pain complaint throughout session.  Therapeutic Activity: Transfers: Patient performed sit<>stand and stand pivot transfers throughout session with close supervision/ intermittent CGA but poor safety awareness as pt attempts to stand from w/c without brakes locked, with leg rests in the way, and initiates descent prior to safe positioning. Provided verbal cues for using BUE to position in front of seat prior to descent, applying brakes, attn to task.  Toilet transfer requires several attempts d/t pt's confusion despite initial desire to toilet. Catheter stabilizing sticker seems to be  placed incorrectly and causing irritating pull to catheter and pt consistently tugging at catheter. Adjustment made to tubing, however, informed nursing to see if this could be adjusted.   Gait Training:  Patient ambulated 150 ft x3 using RW with close supervision/ CGA. Demonstrated flexed posture, slow pace, shuffling gait pattern, increased distractibility. Provided vc/ tc for attn to task, walker mgmt, level gaze. Pt very confused during ambulation and perseverates on train travel, plane travel in past and at the moment.  Bed Mobility: Patient performed sit-->supine requiring gentle vc and guidance to initiate with supervision.  Patient supine  in bed at end of session with brakes locked, bed alarm set, and all needs within reach. Pt with phone in hand and attempting to dial number for, presumably, his wife.   Therapy Documentation Precautions:  Precautions Precautions: Fall, Other (comment) Precaution Comments: SBP<200, aphasia, can be combative Restrictions Weight Bearing Restrictions: No  Pain:  No pain complaint this session.   Therapy/Group: Individual Therapy  JuAlger SimonsT, DPT 07/11/2021, 5:55 PM

## 2021-07-11 NOTE — Progress Notes (Signed)
Patient ID: Devon Elliott., male   DOB: [birth year], 87 y.o.   MRN: [id number]   SW received a phone call from [place] at [facility] requesting a update on patient. SW will provide an update on tomorrow 12/28 to the agency after conference.  [phone]  Lavera Guise, [place] [phone]

## 2021-07-11 NOTE — Progress Notes (Signed)
PROGRESS NOTE   Subjective/Complaints: On the phone Aide assisting him Patient's chart reviewed- No issues reported overnight Vitals signs stable except for elevated BP.   ROS: Limited due to language    Objective:   No results found. Recent Labs    07/09/21 2348  WBC 10.2  HGB 9.7*  HCT 30.9*  PLT 227   Recent Labs    07/09/21 2348  NA 141  K 3.8  CL 114*  CO2 20*  GLUCOSE 106*  BUN 20  CREATININE 1.09  CALCIUM 7.8*    Intake/Output Summary (Last 24 hours) at 07/11/2021 0943 Last data filed at 07/11/2021 0835 Gross per 24 hour  Intake 873 ml  Output 600 ml  Net 273 ml        Physical Exam: Vital Signs Blood pressure (!) 161/96, pulse 63, temperature 97.9 F (36.6 C), resp. rate 16, weight 67.1 kg, SpO2 98 %.   Gen: no distress, normal appearing HEENT: oral mucosa pink and moist, NCAT Cardio: Reg rate Chest: normal effort, normal rate of breathing Abd: soft, non-distended Ext: no edema Psych: flat, slow to engage  Skin: No evidence of breakdown, no evidence of rash , erythema RIght 4th digit Neurologic: a little lethargic.aphasic, follows 25% or less of simple one step commands. Cranial nerves II through XII intact, motor strength is 5/5 in left and 3- RIght deltoid, bicep, tricep, grip, 5/5 bilateral hip flexor, knee extensors, ankle dorsiflexor and plantar flexor Musculoskeletal: right hand pain    Assessment/Plan: 1. Functional deficits which require 3+ hours per day of interdisciplinary therapy in a comprehensive inpatient rehab setting. Physiatrist is providing close team supervision and 24 hour management of active medical problems listed below. Physiatrist and rehab team continue to assess barriers to discharge/monitor patient progress toward functional and medical goals  Care Tool:  Bathing    Body parts bathed by patient: Chest, Face, Right upper leg, Left upper leg   Body parts  bathed by helper: Right arm, Left arm, Front perineal area, Buttocks, Left lower leg, Right lower leg     Bathing assist Assist Level: Maximal Assistance - Patient 24 - 49%     Upper Body Dressing/Undressing Upper body dressing   What is the patient wearing?: Pull over shirt    Upper body assist Assist Level: Maximal Assistance - Patient 25 - 49%    Lower Body Dressing/Undressing Lower body dressing      What is the patient wearing?: Pants, Incontinence brief     Lower body assist Assist for lower body dressing: Total Assistance - Patient < 25% (supine rolling)     Toileting Toileting    Toileting assist Assist for toileting: 2 Helpers     Transfers Chair/bed transfer  Transfers assist     Chair/bed transfer assist level: Moderate Assistance - Patient 50 - 74% Chair/bed transfer assistive device: Walker, Clinical biochemist   Ambulation assist      Assist level: 2 helpers (mod A of 1 and +2 w/c follow) Assistive device: Walker-rolling Max distance: 82f   Walk 10 feet activity   Assist     Assist level: 2 helpers Assistive device: [employer]  50 feet activity   Assist Walk 50 feet with 2 turns activity did not occur: Safety/medical concerns         Walk 150 feet activity   Assist Walk 150 feet activity did not occur: Safety/medical concerns         Walk 10 feet on uneven surface  activity   Assist Walk 10 feet on uneven surfaces activity did not occur: Safety/medical concerns         Wheelchair     Assist Is the patient using a wheelchair?: Yes Type of Wheelchair: Manual    Wheelchair assist level: Maximal Assistance - Patient 25 - 49% Max wheelchair distance: 150    Wheelchair 50 feet with 2 turns activity    Assist        Assist Level: Maximal Assistance - Patient 25 - 49%   Wheelchair 150 feet activity     Assist      Assist Level: Maximal Assistance - Patient 25 - 49%    Blood pressure (!) 161/96, pulse 63, temperature 97.9 F (36.6 C), resp. rate 16, weight 67.1 kg, SpO2 98 %.  Medical Problem List and Plan: 1. 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  Antithrombotics: -DVT/anticoagulation:  Pharmaceutical: Other (comment)  apixaban 5 mg BID             -antiplatelet therapy: none 3. Pain Management: Tylenol Added diclofenac gel to RIght hand - hand pain as per ortho suspect gout but septic arthritis in DDx await MRI RIght hand  12/23- got MRI no septic arthritis- -will continue regimen 4. Mood: Depakote,              -antipsychotic agents: Seroquel 25 mg daily and q HS, Seroquel 25 mg q 6 hours PRN agitation;  Namenda 5. Neuropsych: This patient is not capable of making decisions on his own behalf. 6. Skin/Wound Care: routine skin checks 7. Fluids/Electrolytes/Nutrition: routine ins and outs with follow-up chemistries. Hypokalemia resolved. 8. Atrial fibrillation: rate controlled. Increase lopressor to 39m BID as in #9, Eliquis 5 mg daily 9. Hypertension: goal 1458-099systolic BP post thrombectomy. Continue metoprolol. Amlodipine added 12/13 Vitals:   07/10/21 1939 07/11/21 0540  BP: 130/80 (!) 161/96  Pulse: 83 63  Resp: 16 16  Temp: 97.7 F (36.5 C) 97.9 F (36.6 C)  SpO2: 99% 98%  Increase Lopressor to 583mBID as BP 160s/90s   10: Hyperlipidemia: LDL =  110; goal <70. Started on high intensity statin: atorvastatin 40 mg. Continue at discharge. 11: Delirium with history of mild dementia. Continue Depakote, Seroquel and Namenda Reduced comprehension associated with receptive language deficits are contributing  12: HFrEF: currently euvolemic 13: AKI: resolved. BUN/Cr trending back up   BMP Latest Ref Rng &  Units 07/09/2021 07/08/2021 07/07/2021  Glucose 70 - 99 mg/dL 106(H) 118(H) 119(H)  BUN 8 - 23 mg/dL 20 28(H) 32(H)  Creatinine 0.61 - 1.24 mg/dL 1.09 1.09 1.31(H)  Sodium 135 - 145 mmol/L 141 145 144  Potassium 3.5 - 5.1 mmol/L 3.8 3.9 3.7  Chloride 98 - 111 mmol/L 114(H) 114(H) 110  CO2 22 - 32 mmol/L 20(L) 22 25  Calcium  8.9 - 10.3 mg/dL 7.8(L) 7.9(L) 8.3(L)  Some fluctuation in range of 1.0-1.4, cont to monitor , no nephrotoxic meds identified , negative fluid balance with poor intake , this is likely contributing   12/23- IVFs for elevated Bun of 32- ~ 8-10 points above baseline since here-    12/24 BUN/Cr better but still 28/1.1 today---resume HS fluids 1/2ns at 50cc/hr  12/26- BUN 20 and Cr 1.09- con't HS at night.  14: Urinary retention/bloody clots: Foley cath placed 12/9. Will discontinue in AM. Orders in place for bladder scanning, PVR check and in/out cath if necessary  -blood clots in urine with caths d/t eliquis  -add flomax, bp can tolerate  12/22- still requiring in/out caths- bloody/clots- will check with Urology- if they think we need to place a 3 way foley?  12/26- pt wasn't voiding at all- prior to placing catheter- wife want sus to keep foley- not do in/out caths.  15. Leukocytosis, has been bouncing from 7K to 12 K over the last 2 weeks  12.9 on 12/19  -afebrile  12/23- WBC down to 8.7  -  E coli >100K sens to Keflex     16.RIght fourth digit swelling, x-rays show erosive changes likely chronic however given some elevation of white cells as well as elevated ESR and CRP would pursue MRI of the finger/hand right side, appreciate Ortho assistance  12/23- MRI (-) except mild subQ tissue edema   LOS: 12 days A FACE TO FACE EVALUATION WAS PERFORMED  Martha Clan P Quamesha Mullet 07/11/2021, 9:43 AM

## 2021-07-11 NOTE — Progress Notes (Signed)
Occupational Therapy Session Note  Patient Details  Name: Devon Elliott. MRN: [id number] Date of Birth: 1927/01/31  Today's Date: 07/11/2021 OT Individual Time: 3818-2993 OT Individual Time Calculation (min): 60 min    Short Term Goals: Week 1:  OT Short Term Goal 1 (Week 1): Pt will actively use RUE during self feeding with mod cues. OT Short Term Goal 1 - Progress (Week 1): Not met OT Short Term Goal 2 (Week 1): Pt will demonstrate improved praxis to bathe with min A. OT Short Term Goal 2 - Progress (Week 1): Not met OT Short Term Goal 3 (Week 1): Pt will don shirt with min A. OT Short Term Goal 3 - Progress (Week 1): Not met OT Short Term Goal 4 (Week 1): pt will sit to stand with min A to prepare for transfers. OT Short Term Goal 4 - Progress (Week 1): Not met OT Short Term Goal 5 (Week 1): pt will complete BSC transfers with mod A of 1. OT Short Term Goal 5 - Progress (Week 1): Met Week 2:  OT Short Term Goal 1 (Week 2): pt will sit to stand with min A to prepare for transfers. OT Short Term Goal 2 (Week 2): Pt will don shirt with max A. OT Short Term Goal 3 (Week 2): Pt will complete UB bathing with mod A. OT Short Term Goal 4 (Week 2): Pt will actively participate in 2/3 full therapy sessions. Week 3:     Skilled Therapeutic Interventions/Progress Updates:    The pt was in bed upon arrival, he required MinA for from supine to EOB.  The pt was MinA/ModA for transferring from EOB to w/c LOF secondary to dementia and fear of falling.   The pt was able to demonstrate [place]  for both physical and verbal prompt for bathing at sink LOF due to perseverating and requiring redirecting for moving forward during task performance.  The pt required MaxA for shaving, he was able to complete sit to stand  with CGA and was able to maintain good static standing balance for functional task initiation. The pt tolerated guided BUE exercise to improve UB strength for increase independence. The pt  returned to bed LOF with MinA for managing his legs with his alarm in place, call light available and all additional needs addressed.  The pt had no c/o pain this treatment session.    Therapy Documentation Precautions:  Precautions Precautions: Fall, Other (comment) Precaution Comments: SBP<200, aphasia, can be combative Restrictions Weight Bearing Restrictions: No General:   Vital Signs:  Pain: Pain Assessment Pain Scale: 0-10 Pain Score: 0-No pain  Vision   Perception    Praxis   Balance   Exercises:   Other Treatments:     Therapy/Group: Individual Therapy  Yvonne Kendall 07/11/2021, 12:37 PM

## 2021-07-11 NOTE — Progress Notes (Signed)
Speech Language Pathology Daily Session Note  Patient Details  Name: Devon Elliott. MRN: [id number] Date of Birth: [birth year]  Today's Date: 07/11/2021 SLP Individual Time: 1300-1400 SLP Individual Time Calculation (min): 60 min  Short Term Goals: Week 2: SLP Short Term Goal 1 (Week 2): Patient will consume regular texture PO trials with timely and effective mastication, oral clearance, and without overt s/sx of aspiration with sup A verbal cues for swallow safety. SLP Short Term Goal 2 (Week 2): Patient will demonstrate sustained attenion to functional tasks for 3 minutes with max A verbal cues for redirection SLP Short Term Goal 3 (Week 2): Patient will follow basic one-step directions in 50% of opportunities with max A verbal/visual/modeling cues SLP Short Term Goal 4 (Week 2): Patient will answer yes/no questions regarding personal needs during 50% of occasions  with max A multimodal cues  Skilled Therapeutic Interventions: Skilled ST treatment focused on swallowing and cognitive goals. Patient was on the phone with spouse upon arrival with nurse tech (NT) at bedside for supervision during noon meal. NT reported significant confusion and distractibility affecting attention to meal consumption. Patient eventually ended phone call and returned to consuming his meal w/ ~50% intake. Patient accepting of regular PO trials. He demonstrated effective and efficient mastication, complete oral clearance, and without overt s/sx of aspiration. Recommend diet advancement to regular diet/thin liquids at this time. Continue crushed meds. For remainder of session patient exhibited restlessness, pulling at catheter tubing, and required max A verbal/visual/tactile cues to redirect. Pt often attempting to stand out of wheelchair and perseverated on traveling on a bus to [place]. Patient agreeable to transfer from wheelchair to bed with max A verbal/visual/tactile cues to execute. He was in bed for  approximately 2 minutes and quickly made his way to the EOB despite bed rails raised and attempted to stand. Attempts to verbally redirect were unsuccessful and resulted in increased frustration. In effort to reduce frustration, therapist followed patient's lead and safety directed ambulation around room using RW with sup A verbal cues. He eventually returned to his wheelchair where he mistakenly used call bell as a phone and responded to verbal stimuli from television. This appeared to redirect his attention and pt no longer perseverating on getting to the bus station. Patient was passed off to PT at end of session. Therapist informed PT/Nurse Tech/Nurse of patient's ongoing confusion and restlessness. Continue per current plan of care with plans for family education tomorrow and likely discharge from ST services due to minimal-to-no progress and suspect limited potential for improvement from a cognitive perspective secondary to baseline cognitive deficits.    Pain Pain Assessment Pain Scale: 0-10 Pain Score: 0-No pain  Therapy/Group: Individual Therapy  Manda Holstad T Isahia Hollerbach 07/11/2021, 1:20 PM

## 2021-07-11 NOTE — Progress Notes (Signed)
Patient ID: Devon Elliott., male   DOB: May 15, 1927, 87 y.o.   MRN: [id number]  Hospital bed and bedside commode ordered through [employer].   [place], [place] [phone]

## 2021-07-12 NOTE — Plan of Care (Signed)
Problem: RH Comprehension Communication Goal: LTG Patient will comprehend basic/complex auditory (SLP) Description: LTG: Patient will comprehend basic/complex auditory information with cues (SLP). 07/12/2021 1200 by Charna Elizabeth T, CCC-SLP Outcome: Not Met (add Reason)  Problem: RH Attention Goal: LTG Patient will demonstrate this level of attention during functional activites (SLP) Description: LTG:  Patient will will demonstrate this level of attention during functional activites (SLP) 07/12/2021 1145 by Charna Elizabeth T, CCC-SLP Outcome: Not Met (add Reason) Note: Goal not met due to slow progress and premorbid cognitive deficits limiting functional participation and improvement  Problem: RH Swallowing Goal: LTG Patient will consume least restrictive diet using compensatory strategies with assistance (SLP) Description: LTG:  Patient will consume least restrictive diet using compensatory strategies with assistance (SLP) Outcome: Completed/Met   Problem: RH Expression Communication Goal: LTG Patient will express needs/wants via multi-modal(SLP) Description: LTG:  Patient will express needs/wants via multi-modal communication (gestures/written, etc) with cues (SLP) Outcome: Completed/Met Goal: LTG Patient will verbally express basic/complex needs(SLP) Description: LTG:  Patient will verbally express basic/complex needs, wants or ideas with cues  (SLP) Outcome: Completed/Met

## 2021-07-12 NOTE — Progress Notes (Signed)
PROGRESS NOTE   Subjective/Complaints: Team conference held today He is impulsive trying to get up on his own- telesitter ordered. BP uncontrolled- lopressor increased   ROS: Limited due to language    Objective:   No results found. Recent Labs    07/09/21 2348  WBC 10.2  HGB 9.7*  HCT 30.9*  PLT 227   Recent Labs    07/09/21 2348  NA 141  K 3.8  CL 114*  CO2 20*  GLUCOSE 106*  BUN 20  CREATININE 1.09  CALCIUM 7.8*    Intake/Output Summary (Last 24 hours) at 07/12/2021 1900 Last data filed at 07/12/2021 1825 Gross per 24 hour  Intake 720 ml  Output 1725 ml  Net -1005 ml        Physical Exam: Vital Signs Blood pressure (!) 139/92, pulse 93, temperature 98.4 F (36.9 C), temperature source Oral, resp. rate 19, weight 67.1 kg, SpO2 93 %.   Gen: no distress, normal appearing HEENT: oral mucosa pink and moist, NCAT Cardio: Reg rate Chest: normal effort, normal rate of breathing Abd: soft, non-distended Ext: no edema Psych: impaired understanding, though pleasant, impulsive Skin: No evidence of breakdown, no evidence of rash , erythema RIght 4th digit Neurologic: a little lethargic.aphasic, follows 25% or less of simple one step commands. Cranial nerves II through XII intact, motor strength is 5/5 in left and 3- RIght deltoid, bicep, tricep, grip, 5/5 bilateral hip flexor, knee extensors, ankle dorsiflexor and plantar flexor Musculoskeletal: right hand pain    Assessment/Plan: 1. Functional deficits which require 3+ hours per day of interdisciplinary therapy in a comprehensive inpatient rehab setting. Physiatrist is providing close team supervision and 24 hour management of active medical problems listed below. Physiatrist and rehab team continue to assess barriers to discharge/monitor patient progress toward functional and medical goals  Care Tool:  Bathing    Body parts bathed by patient:  Chest, Face, Right upper leg, Left upper leg, Right lower leg, Left lower leg   Body parts bathed by helper: Right arm, Left arm, Front perineal area, Buttocks, Left lower leg, Right lower leg     Bathing assist Assist Level: Contact Guard/Touching assist     Upper Body Dressing/Undressing Upper body dressing   What is the patient wearing?: Pull over shirt    Upper body assist Assist Level: Minimal Assistance - Patient > 75%    Lower Body Dressing/Undressing Lower body dressing      What is the patient wearing?: Pants, Incontinence brief     Lower body assist Assist for lower body dressing: Maximal Assistance - Patient 25 - 49%     Toileting Toileting    Toileting assist Assist for toileting: 2 Helpers     Transfers Chair/bed transfer  Transfers assist     Chair/bed transfer assist level: Contact Guard/Touching assist Chair/bed transfer assistive device: Walker, Clinical biochemist   Ambulation assist      Assist level: 2 helpers (mod A of 1 and +2 w/c follow) Assistive device: Walker-rolling Max distance: 41f   Walk 10 feet activity   Assist     Assist level: 2 helpers Assistive device: Walker-rolling   Walk 50  feet activity   Assist Walk 50 feet with 2 turns activity did not occur: Safety/medical concerns         Walk 150 feet activity   Assist Walk 150 feet activity did not occur: Safety/medical concerns         Walk 10 feet on uneven surface  activity   Assist Walk 10 feet on uneven surfaces activity did not occur: Safety/medical concerns         Wheelchair     Assist Is the patient using a wheelchair?: Yes Type of Wheelchair: Manual    Wheelchair assist level: Maximal Assistance - Patient 25 - 49% Max wheelchair distance: 150    Wheelchair 50 feet with 2 turns activity    Assist        Assist Level: Maximal Assistance - Patient 25 - 49%   Wheelchair 150 feet activity     Assist       Assist Level: Maximal Assistance - Patient 25 - 49%   Blood pressure (!) 139/92, pulse 93, temperature 98.4 F (36.9 C), temperature source Oral, resp. rate 19, weight 67.1 kg, SpO2 93 %.  Medical Problem List and Plan: 1. 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  Antithrombotics: -DVT/anticoagulation:  Pharmaceutical: Other (comment)  apixaban 5 mg BID             -antiplatelet therapy: none 3. Pain Management: Tylenol Added diclofenac gel to RIght hand - hand pain as per ortho suspect gout but septic arthritis in DDx await MRI RIght hand  12/23- got MRI no septic arthritis- -will continue regimen 4. Mood: Depakote,              -antipsychotic agents: Seroquel 25 mg daily and q HS, Seroquel 25 mg q 6 hours PRN agitation;  Namenda 5. Neuropsych: This patient is not capable of making decisions on his own behalf. 6. Skin/Wound Care: routine skin checks 7. Fluids/Electrolytes/Nutrition: routine ins and outs with follow-up chemistries. Hypokalemia resolved. 8. Atrial fibrillation: rate controlled. Increase lopressor to 70m BID as in #9, Eliquis 5 mg daily 9. Hypertension: goal 1341-937systolic BP post thrombectomy. Continue metoprolol. Amlodipine added 12/13 Vitals:   07/12/21 0600 07/12/21 1621  BP: 132/83 (!) 139/92  Pulse: 97 93  Resp: 18 19  Temp: 98.2 F (36.8 C) 98.4 F (36.9 C)  SpO2: 95% 93%  Continue Lopressor to 559mBID   10: Hyperlipidemia: LDL =  110; goal <70. Started on high intensity statin: atorvastatin 40 mg. Continue at discharge. 11: Delirium with history of mild dementia. Continue Depakote, Seroquel and Namenda Reduced comprehension associated with receptive language deficits are  contributing  12: HFrEF: currently euvolemic 13: AKI: resolved. BUN/Cr trending back up   BMP Latest Ref Rng & Units 07/09/2021 07/08/2021 07/07/2021  Glucose 70 - 99 mg/dL 106(H) 118(H) 119(H)  BUN 8 - 23 mg/dL 20 28(H) 32(H)  Creatinine 0.61 - 1.24 mg/dL 1.09 1.09 1.31(H)  Sodium 135 - 145 mmol/L 141 145 144  Potassium 3.5 - 5.1 mmol/L 3.8 3.9 3.7  Chloride 98 - 111 mmol/L 114(H) 114(H) 110  CO2 22 - 32 mmol/L  20(L) 22 25  Calcium 8.9 - 10.3 mg/dL 7.8(L) 7.9(L) 8.3(L)  Some fluctuation in range of 1.0-1.4, cont to monitor , no nephrotoxic meds identified , negative fluid balance with poor intake , this is likely contributing   12/23- IVFs for elevated Bun of 32- ~ 8-10 points above baseline since here-    12/24 BUN/Cr better but still 28/1.1 today---resume HS fluids 1/2ns at 50cc/hr  12/26- BUN 20 and Cr 1.09- con't HS at night.  14: Urinary retention/bloody clots: Foley cath placed 12/9. Will discontinue in AM. Orders in place for bladder scanning, PVR check and in/out cath if necessary  -blood clots in urine with caths d/t eliquis  -continue flomax, bp can tolerate  12/22- still requiring in/out caths- bloody/clots- will check with Urology- if they think we need to place a 3 way foley?  12/26- pt wasn't voiding at all- prior to placing catheter- wife want sus to keep foley- not do in/out caths.  15. Leukocytosis, has been bouncing from 7K to 12 K over the last 2 weeks  12.9 on 12/19  -afebrile  12/23- WBC down to 8.7  -  E coli >100K sens to Keflex     16.RIght fourth digit swelling, x-rays show erosive changes likely chronic however given some elevation of white cells as well as elevated ESR and CRP would pursue MRI of the finger/hand right side, appreciate Ortho assistance  12/23- MRI (-) except mild subQ tissue edema 17. Impulsitivity: telesitter ordered.   LOS: 13 days A FACE TO FACE EVALUATION WAS PERFORMED  Hae Ahlers P Seanna Sisler 07/12/2021, 7:00 PM

## 2021-07-12 NOTE — Plan of Care (Deleted)
Problem: RH Attention Goal: LTG Patient will demonstrate this level of attention during functional activites (SLP) Description: LTG:  Patient will will demonstrate this level of attention during functional activites (SLP) Outcome: Not Met (add Reason) Note: Goal not met due to slow progress and premorbid cognitive deficits limiting functional participation and improvement   Problem: RH Swallowing Goal: LTG Patient will consume least restrictive diet using compensatory strategies with assistance (SLP) Description: LTG:  Patient will consume least restrictive diet using compensatory strategies with assistance (SLP) Outcome: Completed/Met   Problem: RH Comprehension Communication Goal: LTG Patient will comprehend basic/complex auditory (SLP) Description: LTG: Patient will comprehend basic/complex auditory information with cues (SLP). Outcome: Completed/Met   Problem: RH Expression Communication Goal: LTG Patient will express needs/wants via multi-modal(SLP) Description: LTG:  Patient will express needs/wants via multi-modal communication (gestures/written, etc) with cues (SLP) Outcome: Completed/Met Goal: LTG Patient will verbally express basic/complex needs(SLP) Description: LTG:  Patient will verbally express basic/complex needs, wants or ideas with cues  (SLP) Outcome: Completed/Met

## 2021-07-12 NOTE — Progress Notes (Addendum)
Speech Language Pathology Discharge Summary  Patient Details  Name: Devon Elliott. MRN: [id number] Date of Birth: [birth year]  Today's Date: 07/12/2021 SLP Individual Time: 1130-1200 SLP Individual Time Calculation (min): 30 min  Skilled Therapeutic Interventions: Skilled ST treatment focused on cognitive goals. Patient's family was not present during scheduled education session. Patient exhibited increased confusion and restlessness this date and perseverated on going on a flight. Patient with minimal responsiveness to any meaningful redirections directed by SLP. Pt's focused attention was limited to ~30 second intervals. Patient did respond to orientation questions and verbalized correct response to person and year. He stated it was "March" and for the day of week expressed "the 2nd day." Spoke with treatment team and MD about recommendations for telesitter today during conference due to increased restlessness. Patient was left in recliner with alarm activated and immediate needs within reach at end of session. Plan to discharge from [place] services due to lack of progress.  Patient has met 3 of 5 long term goals.  Patient to discharge at overall Mod;Max level.  Reasons goals not met: Slow/limited progress   Clinical Impression/Discharge Summary: Patient has made minimal gains and has met 3 of 5 long-term goals this admission. Patient demonstrates mild improvement in following commands and expressing basic needs. Patient is currently an overall mod-to-max A for basic comprehension of simple open-ended questions, yes/no questions, and following one-step commands. He is able to communicate his basic needs with mod-to-max A and additional time and continues to be limited by expressive aphasia and attention deficits impacting thought cohesiveness, word finding, and sentence formulation with minimal awareness or repair of error including perseveration, phonemic/semantic paraphasias, and neologisms. From  a cognitive standpoint, patient continues to exhibit confusion, significant internal distractibility, disorientation, and restlessness which has impacted functional participation and progress toward any structured speech/language/cognitive tasks or functional gains. Patient requires max A verbal/visual/tactile redirection and this results in increased frustration/agitation at times. Patient is currently tolerating a regular diet and thin liquids and requires sup A for positioning and attention to task for optimal swallow safety. Family was not present during scheduled family education, however pt's granddaughter was educated on cognitive-communication, language, and swallowing deficits during previous visit. Patient's care partner is independent to provide the necessary cognitive assistance at discharge per discussion with team on 07/12/21. Recommend 24 hour supervision at discharge.   Care Partner:  Caregiver Able to Provide Assistance: Yes  Type of Caregiver Assistance: Physical;Cognitive  Recommendation:  24 hour supervision/assistance  Rationale for SLP Follow Up: Other (comment) (N/A)   Equipment: N/A   Reasons for discharge: Lack of progress towards goals   Patient/Family Agrees with Progress Made and Goals Achieved: No    Campbell Kray T Gethsemane Fischler 07/12/2021, 12:37 PM

## 2021-07-12 NOTE — Patient Care Conference (Signed)
Inpatient RehabilitationTeam Conference and Plan of Care Update Date: 07/12/2021   Time: 10:11 AM    Patient Name: Devon Elliott.      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 Sula Soda Social Worker Present: Lavera Guise, BSW Nurse Present: Chana Bode, RN PT Present: Grier Rocher, PT OT Present: Annye English, OT SLP Present: Eilene Ghazi, SLP     Current Status/Progress Goal Weekly Team Focus  Bowel/Bladder   incontinent of bowel, LBM 12/25. Foley in place.  Regain continence of bowel.  Continue foley care.   Swallow/Nutrition/ Hydration   reg diet, thin liquids, sup for attention to task  sup A  tolerance of regular diet   ADL's   S self-feeding with LUE, S seated grooming, min A oral care, Max UB dressing/sponge bathing EOB, total A toileting, LB dressing, ambulatory toilet transfers with min to mod A with RW; requires significant amount of time to rouse/be redirected to participate/resistant to tactile cuing  downgraded to min toilet transfers, mod UB / LB dressing and bathing, toileting,  RUE NMR, transfer/self-care /balance retraining, activity tolerance, pt/family education, activity tolerance   Mobility   significant confusion resulting in resistive behaviors with physical assistance when tactile cues provided to facilitate mobility; can demo significant agitation resulting in unsafe behaviors; Bed mobility = close supervision/ CGA unless resistive and then up to Max A to initiate and complete; Transfers  = supervision/ CGA when responsive to cueing, otherwise resistive and near combative d/t confusion, Ambulation = up to 150' using RW with CGA/ close supervision but easily distractible by objects and people, very slow, shuffling gait pattern (significant confusion resulting in resistive behaviors with physical assistance when tactile cues provided to facilitate mobility; can demo significant agitation resulting in unsafe behaviors; Bed mobility = close supervision/ CGA unless resistive and th)  CGA overall at ambulatory level  activity tolerance, pt education, motor planning, bed mobility training, gait training, standing balance/ tolerance   Communication   mod-to-maxA  mod-to-max (goals downgraded)  following simple commands, communicating basic needs   Safety/Cognition/ Behavioral Observations  mod-to-max A  mod-to-max A  family ed, plan to discharge due to minimal progress   Pain   no c/o pain  Pain <3/10  Assess Qshift and prn   Skin   Scattered bruising. Remainder of skin intact.  Maintain skin integrity  Assess Qshift and prn     Discharge Planning:  Discharging home with caregiver, spouse & son to provide assistance. Min A, 24/7 supervision avaliable   Team Discussion: BP variable. MRI right hand; MD addressed. E.coli UTI. Patient has been lethargic and hard to arouse until today. More alert and mobile. Poor safety awareness noted with poor attention, global aphasia; requires cues to use a walker and pay attention to tasks.  Patient on target to meet rehab goals: no, little progress noted to date.   *See Care Plan and progress notes for long and short-term goals.   Revisions to Treatment Plan:  Downgraded goals to min - mod assist  SLP discontinued services  due to lack of progress with cognition/participation.  Teaching Needs: Safety, toileting, medications, transfers, cues for safety, secondary risk management, etc.   Current Barriers to Discharge: Decreased caregiver support  and Incontinence  Possible Resolutions to Barriers: Family education 07/12/21 Family asking about ALF option      Medical Summary Current Status: embolic infarction, improving physically and cognition BP uncontntrolled,         Continued Need for Acute Rehabilitation Level of Care: The patient requires daily medical management by a physician with specialized training in physical medicine and rehabilitation for the following reasons: Direction of a multidisciplinary physical rehabilitation program to maximize functional independence : Yes Medical management of patient stability for increased activity during participation in an intensive rehabilitation regime.: Yes Analysis of laboratory values and/or radiology reports with any subsequent need for medication adjustment and/or medical intervention. : Yes   I attest that I was present, lead the team conference, and concur with the assessment and plan of the team.   Chana Bode B 07/12/2021, 2:30 PM

## 2021-07-12 NOTE — Progress Notes (Addendum)
Patient ID: Devon Elliott., male   DOB: [birth year], 87 y.o.   MRN: [id number]  Team Conference Report to Patient/Family  Team Conference discussion was reviewed with the patient and caregiver, including goals, any changes in plan of care and target discharge date.  Patient and caregiver express understanding and are in agreement.  The patient has a target discharge date of 07/18/21.  Sw met with patient family member [name]. Informed her of patient team conference updates. Sw informed HC established with [facility], sw will provide the agency with an additional update on today. [name] would like all Dme delivered to the home and [facility] established with [facility]. Family would like to keep d/c of 07/18/21. No additional questions or concerns.   [name] 07/12/2021, 11:19 AM

## 2021-07-12 NOTE — Progress Notes (Signed)
Occupational Therapy Session Note  Patient Details  Name: Lucan Riner. MRN: [id number] Date of Birth: 1926/12/29  Today's Date: 07/12/2021 OT Individual Time: 5093-2671 OT Individual Time Calculation (min): 53 min  + 61 min    Short Term Goals: Week 1:  OT Short Term Goal 1 (Week 1): Pt will actively use RUE during self feeding with mod cues. OT Short Term Goal 1 - Progress (Week 1): Not met OT Short Term Goal 2 (Week 1): Pt will demonstrate improved praxis to bathe with min A. OT Short Term Goal 2 - Progress (Week 1): Not met OT Short Term Goal 3 (Week 1): Pt will don shirt with min A. OT Short Term Goal 3 - Progress (Week 1): Not met OT Short Term Goal 4 (Week 1): pt will sit to stand with min A to prepare for transfers. OT Short Term Goal 4 - Progress (Week 1): Not met OT Short Term Goal 5 (Week 1): pt will complete BSC transfers with mod A of 1. OT Short Term Goal 5 - Progress (Week 1): Met Week 2:  OT Short Term Goal 1 (Week 2): pt will sit to stand with min A to prepare for transfers. OT Short Term Goal 2 (Week 2): Pt will don shirt with max A. OT Short Term Goal 3 (Week 2): Pt will complete UB bathing with mod A. OT Short Term Goal 4 (Week 2): Pt will actively participate in 2/3 full therapy sessions.  Skilled Therapeutic Interventions/Progress Updates:    Session 1 [phone]): Pt received asleep in bed, but easily awakened to voice this date, agreeable to therapy. Session focus on self-care retraining, activity tolerance, transfer retraining in prep for improved ADL/IADL/func mobility performance + decreased caregiver burden. Came to sitting EOB on his R with CGA and light min A initially to prevent R lean. Pt much more alert and participatory in ADL routine this am compared to previous sessions, demonstrating imporved ability to follow commands and answer conversational questions appropriately.  Doffed shirt with min A to pull overhead, able to wash face with S, but  declined further bathing. Donned new shirt with min A to pull overhead. Doffed pants with max A to remove from BLE and to manage foley. Bathed BLE with CGA seated EOB. Donned new brief/pants with mod A to thread BLE and to manage foley.   Ambulated to and from sink with CGA to min A to manage RW due pt remaining to the L of RW. Stood at sink with CGA to brush hair and teeth with assist to open tooth paste.   Seated EOB, doffed socks with CGA, total A to don new socks, able to pull up over ankles to assist.  Set-up A for breakfast materials and pt able to self feed breakfast with S and incorporate BUE, this date.   Pt fidgeting with gauze protecting his R forearm IV, requiring consistent redirection to leave gauze in place. Not orientated to location/situation, but did state "I forgot I had a stroke" when reoriented. Stated he was in "[place]."  Pt left semi-reclined in bed with bed alarm engaged, call bell in reach, and all immediate needs met.    Session 2 [phone]): Pt received seated in recliner with step-son and DIL present for family education, no s/sx pain throughout session, agreeable to therapy. Session focus on self-care education, activity tolerance, dynamic standing balance, transfer retraining in prep for improved ADL/IADL/func mobility performance + decreased caregiver burden. Reviewed current ADL performance re dressing, toileting,  etc. Attempted to engage pt in dressing task, but pt becoming slightly agitated and fidgeting with gait belt attempting to buckle it. Pt requiring redirection and ambulated [place] with close S to CGA throughout with RW and cues for obstacle navigation. Required max encouragement to rest at half way point and min A to guide RW into turn.   Reviewed reducing falls hazards, agitation, dementia friendly approaches, DME needs, facilitating safe leisure activities, and recommendations for 24/7 close S for BADL/IADL. Family verbalized understanding.  Pt  hyperverbal throughout session requiring frequent redirection. Pt becoming slightly agitated and perseverating on going home. Pt stated that he was "half way between [place]" when asked about his location.  Pt left seated in recliner with family present, safety belt alarm engaged, call bell in reach, and all immediate needs met.    Therapy Documentation Precautions:  Precautions Precautions: Fall, Other (comment) Precaution Comments: SBP<200, aphasia, can be combative Restrictions Weight Bearing Restrictions: No  Pain: no s/sx throughout session ADL: See Care Tool for more details.  Therapy/Group: Individual Therapy  Volanda Napoleon MS, OTR/L  07/12/2021, 6:53 AM

## 2021-07-12 NOTE — Progress Notes (Addendum)
Family Education provided today with Berton Mount.  Discussed current medications, purpose, dose, frequency, and route. Discussed safety/current transfer methods, disease processes/redirecting and nutrition/supervision. Patient requiring 24/7 supervision.

## 2021-07-12 NOTE — Progress Notes (Signed)
Physical Therapy Session Note  Patient Details  Name: Devon Elliott. MRN: [id number] Date of Birth: 1927/04/30  Today's Date: 07/12/2021 PT Individual Time: 0900-0954 PT Individual Time Calculation (min): 54 min   Short Term Goals: Week 1:  PT Short Term Goal 1 (Week 1): Patient will complete bed mobility with CGA PT Short Term Goal 1 - Progress (Week 1): Not progressing PT Short Term Goal 2 (Week 1): Patient will complete bed <> wc transfers with LRAD and ModA x1 PT Short Term Goal 2 - Progress (Week 1): Met PT Short Term Goal 3 (Week 1): Patient will ambulate >33f with LRAD and MinA x1 PT Short Term Goal 3 - Progress (Week 1): Met Week 2:  PT Short Term Goal 1 (Week 2): Pt will ambulate 531fwith min assist and LRAD PT Short Term Goal 2 (Week 2): Pt will perform bed mobility with min assist consistenly PT Short Term Goal 3 (Week 2): Family education will be initiated PT Short Term Goal 4 (Week 2): Pt will remain out of bed >2 hours between therapies.   Skilled Therapeutic Interventions/Progress Updates:   Pt received supine in bed and agreeable to PT. Supine>sit transfer with supervision assist and cues for use of bed rail as needed. Son and daughter-in-law present throughout session for education. Pt ambulated through room with CGA-supervision assist with RW. Pt reports need for urination requesting to go to bathroom despite have foley catheter. Pt performed gait through hall with CGA-supervision with RW. Family educted on pt's distractability with cues for how to re-direct while engaging in current lack of awareness to location, situation, and deficits. [facility] main therapy gym with total A for time management. Pt performed car transfer with min assist and max cues for safety when attempting to exit care due to poor motor planning or awareness of location and technique. Stair management training with CGA and BUE support on rail with faimly present and cues  for position  and use of rails. [facility] and all needs met.  Eduction for family on need for 24hour supervision assist for safety due to impulsivity and decreased awareness of deficits.        Therapy Documentation Precautions:  Precautions Precautions: Fall, Other (comment) Precaution Comments: SBP<200, aphasia, can be combative Restrictions Weight Bearing Restrictions: No  Pain: denies   Therapy/Group: Individual Therapy  AuLorie Phenix2/28/2022, 10:44 AM

## 2021-07-13 MED ORDER — TAMSULOSIN HCL 0.4 MG PO CAPS
0.8000 mg | ORAL_CAPSULE | Freq: Every day | ORAL | Status: DC
  Administered 2021-07-13 – 2021-07-17 (×5): 0.8 mg via ORAL
  Filled 2021-07-13 (×5): qty 2

## 2021-07-13 NOTE — Plan of Care (Signed)
°  Problem: RH Tub/Shower Transfers Goal: LTG Patient will perform tub/shower transfers w/assist (OT) Description: LTG: Patient will perform tub/shower transfers with assist, with/without cues using equipment (OT) Outcome: Not Applicable Note: Goal discharged as anticipate pt will be primarily completing sponge baths due to poor command follow/safety awareness.

## 2021-07-13 NOTE — Discharge Summary (Signed)
Physician Discharge Summary  Patient ID: Martyn Malay. MRN: [id number] DOB/AGE: [birth year] 87 y.o.  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 Coli Right fourth digit swelling and pain   Discharged Condition: fair/stable  Significant Diagnostic Studies: CT ANGIO HEAD W OR WO CONTRAST  Result Date: 06/21/2021 CLINICAL DATA:  Stroke follow-up. Recent thrombectomy for left M2 occlusion. EXAM: CT ANGIOGRAPHY HEAD TECHNIQUE: Multidetector CT imaging of the head was performed using the standard protocol during bolus administration of intravenous contrast. Multiplanar CT image reconstructions and MIPs were obtained to evaluate the vascular anatomy. CONTRAST:  21m OMNIPAQUE IOHEXOL 350 MG/ML SOLN COMPARISON:  Head CT and CTA 06/19/2021 FINDINGS: CT HEAD Brain: No definite acute cortically based infarct, intracranial hemorrhage, mass, midline shift, or extra-axial fluid collection is identified. Confluent hypodensities in the cerebral white matter bilaterally are unchanged and nonspecific but compatible with extensive chronic small vessel ischemic disease. There is mild cerebral atrophy. Vascular: Calcified atherosclerosis at the skull base. Skull: No fracture or suspicious osseous lesion. Sinuses: Paranasal sinuses and mastoid air cells are clear. Other: Bilateral cataract extraction. CTA HEAD Anterior circulation: The included internal carotid arteries are patent with unchanged mild stenosis of the distal left cervical ICA and no significant intracranial ICA stenosis. ACAs and MCAs are patent without evidence of a proximal branch  occlusion. The revascularized left M2 vessel remains patent. There unchanged moderate right M1, moderate proximal left M2, and severe proximal left A1 stenoses. No aneurysm is identified. Posterior circulation: The intracranial left vertebral artery is widely patent and supplies the basilar. 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 Patent left PICA, bilateral AICA, and bilateral SCA origins are identified. The basilar artery is widely patent. There is a large left posterior communicating artery. Both PCAs are patent with unchanged mild right P1 narrowing and moderate to severe stenoses of the distal left P2 segment and right P2 bifurcation. No aneurysm is identified. Venous sinuses: As permitted by contrast timing, patent. Anatomic variants: None. Review of the MIP images confirms the above findings. IMPRESSION: 1. No evidence of acute intracranial abnormality. No new intracranial large vessel occlusion. 2. Persistent patency of the revascularized left M2 vessel. 3. 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 4. Unchanged intracranial atherosclerosis with moderate to severe anterior and posterior circulation stenoses as detailed above. Electronically Signed   By: ALogan BoresM.D.   On: 06/21/2021 12:24   DG Chest 2 View  Result Date: 07/15/2021 CLINICAL DATA:  Fever. EXAM: CHEST - 2 VIEW COMPARISON:  July 05, 2019 FINDINGS: Low lung volumes are seen with mild, diffusely increased interstitial lung markings. Mild to moderate severity areas of atelectasis and/or infiltrate are seen within the bilateral lung bases. There is no evidence of a pleural effusion or pneumothorax. The cardiac silhouette is mildly enlarged. Evidence of prior vertebroplasty is noted within the upper lumbar spine. IMPRESSION: Low lung volumes with mild to moderate severity bibasilar atelectasis and/or infiltrate. Electronically Signed   By: TVirgina NorfolkM.D.   On:  07/15/2021 21:30   MR BRAIN WO CONTRAST  Result Date: 06/24/2021 CLINICAL DATA:  Follow-up examination for acute stroke. EXAM: MRI HEAD WITHOUT CONTRAST TECHNIQUE: Multiplanar, multiecho pulse sequences of  the brain and surrounding structures were obtained without intravenous contrast. COMPARISON:  Multiple previous CTs dating back to 06/19/2021. FINDINGS: Brain: Diffuse prominence of the CSF containing spaces compatible generalized cerebral atrophy. Confluent T2/FLAIR hyperintensity involving the periventricular and deep white matter both cerebral hemispheres consistent with chronic microvascular ischemic disease, moderately advanced in nature. Scattered restricted diffusion involving the posterior left frontoparietal region consistent with acute left MCA distribution infarct (series 2, image 31). Involvement is predominantly cortical in nature, although there is some subcortical involvement at the frontal lobe anteriorly. No associated hemorrhage or mass effect. No other evidence for acute or subacute ischemia. Gray-white matter differentiation otherwise maintained. No other areas of chronic infarction. No acute or chronic intracranial hemorrhage. No mass lesion, mass effect, or midline shift. Ventricular prominence related to global parenchymal volume loss without hydrocephalus. No extra-axial fluid collection. Vascular: Hypoplastic right vertebral artery not visualized, consistent with previously identified occlusion. Major intracranial vascular flow voids otherwise maintained. Skull and upper cervical spine: Craniocervical junction within normal limits. Bone marrow signal intensity normal. No scalp soft tissue abnormality. Sinuses/Orbits: Prior bilateral ocular lens replacement. Paranasal sinuses are largely clear. No significant mastoid effusion. Other: None. IMPRESSION: 1. Acute ischemic nonhemorrhagic left MCA distribution infarct involving the posterior left frontoparietal region. No associated  hemorrhage or mass effect. 2. Underlying age-related cerebral atrophy with moderate chronic microvascular ischemic disease. Electronically Signed   By: Jeannine Boga M.D.   On: 06/24/2021 21:52   MR HAND RIGHT WO CONTRAST  Result Date: 07/06/2021 CLINICAL DATA:  Septic arthritis suspected.  Hand x-ray done. EXAM: MRI OF THE RIGHT HAND WITHOUT CONTRAST TECHNIQUE: Multiplanar, multisequence MR imaging of the right was performed. No intravenous contrast was administered. COMPARISON:  Radiograph dated June 30, 2021 FINDINGS: Exam is limited due to motion artifact in multiple sequences. Bones/Joint/Cartilage No appreciable joint effusion or marrow edema to suggest septic arthritis/osteomyelitis. There are subchondral cystic changes and irregularity of the fourth metacarpal head, likely representing chronic arthritis. Ligaments Limited evaluation due to motion.  No definite tear. Muscles and Tendons Flexor and extensor tendons are within normal limits. Soft tissues Mild subcutaneous soft tissue edema about the dorsum of the hand. No drainable fluid collection. IMPRESSION: 1. Subchondral cystic changes without bone marrow edema and joint effusion at the fourth metatarsophalangeal joint. No evidence of acute osteomyelitis/septic arthritis. 2. Mild subcutaneous soft tissue edema about the dorsum of the hand. No drainable fluid collection or abscess. Electronically Signed   By: Keane Police D.O.   On: 07/06/2021 13:59   DG Hand 2 View Right  Result Date: 06/30/2021 CLINICAL DATA:  Pain EXAM: RIGHT HAND - 2 VIEW COMPARISON:  None. FINDINGS: There is deformity in the head of fourth metacarpal with multiple radiolucencies and erosive changes in the cortical margins. No definite recent fracture is seen. There is small focus of erosive change in the cortical margin in the base of proximal phalanx of the fourth finger. Bony spurs are noted in the interphalangeal joint of right thumb. Possible small subcortical  cyst is seen in the head of the third metacarpal. IMPRESSION: No recent fracture or dislocation is seen. There is deformity of head of the right fourth metacarpal with subcortical lucencies and possible disruption of cortical margins. There is possible small erosive change in the base of proximal phalanx of fourth finger close to the fourth metacarpophalangeal joint. Findings may suggest acute or chronic arthritis or septic arthritis. Follow-up three-phase bone scan or MRI as clinically warranted should be considered. Electronically Signed  By: Elmer Picker M.D.   On: 06/30/2021 13:56   IR CT Head Ltd  Result Date: 06/20/2021 INDICATION: 87 year old male presents for cervical and cerebral angiogram secondary to left M2 occlusion and acute stroke. EXAM: ULTRASOUND-GUIDED ACCESS RIGHT COMMON FEMORAL ARTERY LEFT-SIDED CERVICAL AND CEREBRAL ANGIOGRAM MECHANICAL THROMBECTOMY LEFT M2 ANGIO-SEAL FOR HEMOSTASIS. COMPARISON:  CT imaging same day MEDICATIONS: No intra arterial medications ANESTHESIA/SEDATION: The anesthesia team was present to provide general endotracheal tube anesthesia and for patient monitoring during the procedure. Intubation was performed in biplane/neurointerventional suite. Left radial arterial line was performed by the anesthesia team. Interventional neuro radiology nursing staff was also present. CONTRAST:  80 cc FLUOROSCOPY TIME:  Fluoroscopy Time: 9 minutes 30 seconds (675.8 mGy). COMPLICATIONS: None TECHNIQUE: Informed written consent was obtained from the patient's family after a thorough discussion of the procedural risks, benefits and alternatives. Specific risks discussed include: Bleeding, infection, contrast reaction, kidney injury/failure, need for further procedure/surgery, arterial injury or dissection, embolization to new territory, intracranial hemorrhage (10-15% risk), neurologic deterioration, cardiopulmonary collapse, death. All questions were addressed. Maximal Sterile  Barrier Technique was utilized including during the procedure including caps, mask, sterile gowns, sterile gloves, sterile drape, hand hygiene and skin antiseptic. A timeout was performed prior to the initiation of the procedure. The anesthesia team was present to provide general endotracheal tube anesthesia and for patient monitoring during the procedure. Interventional neuro radiology nursing staff was also present. FINDINGS: Initial Findings: Left common carotid artery: Common carotid artery with significant tortuosity after the origin from the aortic arch. Independent origin from the aortic arch. No significant atherosclerotic changes. Left external carotid artery: Patent with antegrade flow. Left internal carotid artery: Mild tortuosity of the cervical ICA segment. Moderate atherosclerosis at the origin of the ICA with less than 50% stenosis at the origin and minimal irregularity. Vertical and petrous segment patent with normal course caliber contour. Cavernous segment patent. Clinoid segment patent. Antegrade flow of the ophthalmic artery. Ophthalmic segment patent. Terminus patent. Left MCA: M1 segment patent. Insular and opercular segments patent. The inferior division is occluded just beyond the origin, with TICI 0: No perfusion or antegrade flow beyond site of occlusion of the affected division. Fetal PCOM configuration on the left perfusing the posterior circulation. Left ACA: A1 segment is patent. Patent anterior communicating artery with some cross-filling into the right-sided territory. Completion Findings: Left MCA: TICI 3: Complete perfusion of the territory after restoring flow through the affected inferior M2 division Flat panel CT performed demonstrates no intracranial hemorrhage. PROCEDURE: The anesthesia team was present to provide general endotracheal tube anesthesia and for patient monitoring during the procedure. Intubation was performed in negative pressure [place] in neuro IR holding.  Interventional neuro radiology nursing staff was also present. Ultrasound survey of the right inguinal region was performed with images stored and sent to PACs, confirming patency of the right common femoral artery. 11 blade scalpel was used to make a small incision. Blunt dissection was performed with US guidance. A micropuncture needle was used access the right common femoral artery under ultrasound. With excellent arterial blood flow returned, an .018 micro wire was passed through the needle, observed to enter the abdominal aorta under fluoroscopy. The needle was removed, and a micropuncture sheath was placed over the wire. The inner dilator and wire were removed, and an 035 wire was advanced under fluoroscopy into the abdominal aorta. Five [place] standard sheath was placed. Standard 5 French cavus catheter was advanced over the J wire. Catheter was advanced to the  proximal descending thoracic aorta and the wire was removed. Double flush was performed. Davis catheter was then used to select the left common carotid artery. A standard Glidewire was then advanced to the [place] catheter into the cervical ICA. Davis catheter was then advanced on the Glidewire into the distal cervical ICA. Glidewire was removed and contrast injected confirmed a luminal position. Exchange length roadrunner wire was then placed through the [place] catheter in the [place] catheter was removed. The 5 French sheath was removed and a 25cm 66F straight vascular sheath was placed. The dilator was removed and the sheath was flushed. Sheath was attached to pressurized and heparinized saline bag for constant forward flow. A coaxial system was then advanced over the roadrunner 035 wire. This included a 95cm 087 "[place]" balloon guide with coaxial 125cm Berenstein diagnostic catheter. This was advanced to the distal cervical ICA segment. Wire and catheter were then removed. Double flush of the catheter was performed. Angiogram was performed. Road map  function was used once the occluded vessel was identified. Copious back flush was performed and the balloon catheter was attached to heparinized and pressurized saline bag for forward flow. We elected to use a direct first aspiration attempt for the first pass. A zoom 35 aspiration catheter was advanced with a coaxial synchro soft wire through the balloon guide. The aspiration catheter was advanced into the inferior division up to the site of occlusion. When the catheter was just proximal to the site of occlusion, wire was removed and the aspiration catheter was attached to the proprietary aspiration vacuum system. This was turned on, aspiration of blood was confirmed, and then the catheter was advanced to the site of occlusion until resistance was encountered and cessation of blood flow was observed in the aspiration tubing. We observed approximally 2 minutes of aspiration in this position. The balloon at the balloon guide catheter was then inflated under fluoroscopy for proximal flow arrest. Constant aspiration using the proprietary engine was then performed at the aspiration catheter, as the zoom 35 catheter was gently and slowly withdrawn with fluoroscopic observation. Once the catheter was removed from the balloon guide, free aspiration was confirmed at the hub of the balloon guide catheter, with free blood return confirmed. The balloon was then deflated, and a control angiogram was performed. Restoration of flow was confirmed. Angiogram of the cervical ICA was performed. Balloon guide was then completely removed. The skin at the puncture site was then cleaned with Chlorhexidine. The 8 French sheath was removed and an 66F angioseal was deployed. Flat panel CT was performed. Patient was extubated once the CT was reviewed. Patient tolerated the procedure well and remained hemodynamically stable throughout. No complications were encountered and no significant blood loss encountered. IMPRESSION: Status post  ultrasound guided access of right common femoral artery for left-sided cervical/cerebral angiogram and mechanical thrombectomy of left M2 occlusion using direct aspiration technique with 1 pass and achieving TICI 3 flow. Angio-Seal for hemostasis. Signed, Dulcy Fanny. Dellia Nims, RPVI [facility] Radiology PLAN: ICU status Target systolic blood pressure of 120-140 Right hip straight time 4 hours Frequent neurovascular checks Repeat neurologic imaging with CT and/MRI at the discretion of neurology team Electronically Signed   By: Corrie Mckusick D.O.   On: 06/20/2021 08:19   IR US Guide Vasc Access Right  Result Date: 06/20/2021 INDICATION: 87 year old male presents for cervical and cerebral angiogram secondary to left M2 occlusion and acute stroke. EXAM: ULTRASOUND-GUIDED ACCESS RIGHT COMMON FEMORAL ARTERY LEFT-SIDED CERVICAL  AND CEREBRAL ANGIOGRAM MECHANICAL THROMBECTOMY LEFT M2 ANGIO-SEAL FOR HEMOSTASIS. COMPARISON:  CT imaging same day MEDICATIONS: No intra arterial medications ANESTHESIA/SEDATION: The anesthesia team was present to provide general endotracheal tube anesthesia and for patient monitoring during the procedure. Intubation was performed in biplane/neurointerventional suite. Left radial arterial line was performed by the anesthesia team. Interventional neuro radiology nursing staff was also present. CONTRAST:  80 cc FLUOROSCOPY TIME:  Fluoroscopy Time: 9 minutes 30 seconds (675.8 mGy). COMPLICATIONS: None TECHNIQUE: Informed written consent was obtained from the patient's family after a thorough discussion of the procedural risks, benefits and alternatives. Specific risks discussed include: Bleeding, infection, contrast reaction, kidney injury/failure, need for further procedure/surgery, arterial injury or dissection, embolization to new territory, intracranial hemorrhage (10-15% risk), neurologic deterioration, cardiopulmonary collapse, death. All  questions were addressed. Maximal Sterile Barrier Technique was utilized including during the procedure including caps, mask, sterile gowns, sterile gloves, sterile drape, hand hygiene and skin antiseptic. A timeout was performed prior to the initiation of the procedure. The anesthesia team was present to provide general endotracheal tube anesthesia and for patient monitoring during the procedure. Interventional neuro radiology nursing staff was also present. FINDINGS: Initial Findings: Left common carotid artery: Common carotid artery with significant tortuosity after the origin from the aortic arch. Independent origin from the aortic arch. No significant atherosclerotic changes. Left external carotid artery: Patent with antegrade flow. Left internal carotid artery: Mild tortuosity of the cervical ICA segment. Moderate atherosclerosis at the origin of the ICA with less than 50% stenosis at the origin and minimal irregularity. Vertical and petrous segment patent with normal course caliber contour. Cavernous segment patent. Clinoid segment patent. Antegrade flow of the ophthalmic artery. Ophthalmic segment patent. Terminus patent. Left MCA: M1 segment patent. Insular and opercular segments patent. The inferior division is occluded just beyond the origin, with TICI 0: No perfusion or antegrade flow beyond site of occlusion of the affected division. Fetal PCOM configuration on the left perfusing the posterior circulation. Left ACA: A1 segment is patent. Patent anterior communicating artery with some cross-filling into the right-sided territory. Completion Findings: Left MCA: TICI 3: Complete perfusion of the territory after restoring flow through the affected inferior M2 division Flat panel CT performed demonstrates no intracranial hemorrhage. PROCEDURE: The anesthesia team was present to provide general endotracheal tube anesthesia and for patient monitoring during the procedure. Intubation was performed in negative  pressure [place] in neuro IR holding. Interventional neuro radiology nursing staff was also present. Ultrasound survey of the right inguinal region was performed with images stored and sent to PACs, confirming patency of the right common femoral artery. 11 blade scalpel was used to make a small incision. Blunt dissection was performed with US guidance. A micropuncture needle was used access the right common femoral artery under ultrasound. With excellent arterial blood flow returned, an .018 micro wire was passed through the needle, observed to enter the abdominal aorta under fluoroscopy. The needle was removed, and a micropuncture sheath was placed over the wire. The inner dilator and wire were removed, and an 035 wire was advanced under fluoroscopy into the abdominal aorta. Five [place] standard sheath was placed. Standard 5 French cavus catheter was advanced over the J wire. Catheter was advanced to the proximal descending thoracic aorta and the wire was removed. Double flush was performed. Davis catheter was then used to select the left common carotid artery. A standard Glidewire was then advanced to the [place] catheter into the cervical ICA. Davis catheter was then advanced on the  Glidewire into the distal cervical ICA. Glidewire was removed and contrast injected confirmed a luminal position. Exchange length roadrunner wire was then placed through the [place] catheter in the [place] catheter was removed. The 5 French sheath was removed and a 25cm 63F straight vascular sheath was placed. The dilator was removed and the sheath was flushed. Sheath was attached to pressurized and heparinized saline bag for constant forward flow. A coaxial system was then advanced over the roadrunner 035 wire. This included a 95cm 087 "[place]" balloon guide with coaxial 125cm Berenstein diagnostic catheter. This was advanced to the distal cervical ICA segment. Wire and catheter were then removed. Double flush of the catheter was performed.  Angiogram was performed. Road map function was used once the occluded vessel was identified. Copious back flush was performed and the balloon catheter was attached to heparinized and pressurized saline bag for forward flow. We elected to use a direct first aspiration attempt for the first pass. A zoom 35 aspiration catheter was advanced with a coaxial synchro soft wire through the balloon guide. The aspiration catheter was advanced into the inferior division up to the site of occlusion. When the catheter was just proximal to the site of occlusion, wire was removed and the aspiration catheter was attached to the proprietary aspiration vacuum system. This was turned on, aspiration of blood was confirmed, and then the catheter was advanced to the site of occlusion until resistance was encountered and cessation of blood flow was observed in the aspiration tubing. We observed approximally 2 minutes of aspiration in this position. The balloon at the balloon guide catheter was then inflated under fluoroscopy for proximal flow arrest. Constant aspiration using the proprietary engine was then performed at the aspiration catheter, as the zoom 35 catheter was gently and slowly withdrawn with fluoroscopic observation. Once the catheter was removed from the balloon guide, free aspiration was confirmed at the hub of the balloon guide catheter, with free blood return confirmed. The balloon was then deflated, and a control angiogram was performed. Restoration of flow was confirmed. Angiogram of the cervical ICA was performed. Balloon guide was then completely removed. The skin at the puncture site was then cleaned with Chlorhexidine. The 8 French sheath was removed and an 63F angioseal was deployed. Flat panel CT was performed. Patient was extubated once the CT was reviewed. Patient tolerated the procedure well and remained hemodynamically stable throughout. No complications were encountered and no significant blood loss  encountered. IMPRESSION: Status post ultrasound guided access of right common femoral artery for left-sided cervical/cerebral angiogram and mechanical thrombectomy of left M2 occlusion using direct aspiration technique with 1 pass and achieving TICI 3 flow. Angio-Seal for hemostasis. Signed, Dulcy Fanny. Dellia Nims, RPVI [facility] Radiology PLAN: ICU status Target systolic blood pressure of 120-140 Right hip straight time 4 hours Frequent neurovascular checks Repeat neurologic imaging with CT and/MRI at the discretion of neurology team Electronically Signed   By: Corrie Mckusick D.O.   On: 06/20/2021 08:19   DG Swallowing Func-Speech Pathology  Result Date: 06/20/2021 Table formatting from the original result was not included. Images from the original result were not included. Objective Swallowing Evaluation: Type of Study: MBS-Modified Barium Swallow Study  Patient Details Name: Ozie Lupe. MRN: [id number] Date of Birth: [birth year] Today's Date: 06/20/2021 Time: SLP Start Time (ACUTE ONLY): 1224 -SLP Stop Time (ACUTE ONLY): 1233 SLP Time Calculation (min) (ACUTE ONLY): 9 min Past Medical History: Past Medical History: Diagnosis Date  Complete tear of left rotator cuff 07/21/2014  Essential hypertension 04/19/2014  Impingement syndrome of left shoulder 07/21/2014  Left hip pain 02/08/2016  Left supraspinatus tenosynovitis 07/21/2014  Pure hypercholesterolemia 04/19/2014 Past Surgical History: Past Surgical History: Procedure Laterality Date  ELBOW SURGERY  1995  ESOPHAGOGASTRODUODENOSCOPY N/A 05/01/2020  Procedure: ESOPHAGOGASTRODUODENOSCOPY (EGD);  Surgeon: Toledo, Benay Pike, MD;  Location: [facility] ENDOSCOPY;  Service: Gastroenterology;  Laterality: N/A;  ESOPHAGOGASTRODUODENOSCOPY (EGD) WITH PROPOFOL N/A 05/31/2016  Procedure: ESOPHAGOGASTRODUODENOSCOPY (EGD) WITH PROPOFOL;  Surgeon: Jonathon Bellows, MD;  Location: [facility] ENDOSCOPY;  Service: Endoscopy;  Laterality: N/A;  IR CT  HEAD LTD  06/19/2021  IR PERCUTANEOUS ART THROMBECTOMY/INFUSION INTRACRANIAL INC DIAG ANGIO  06/19/2021  IR US GUIDE VASC ACCESS RIGHT  06/19/2021  KYPHOPLASTY N/A 07/30/2019  Procedure: L1 KYPHOPLASTY;  Surgeon: Hessie Knows, MD;  Location: [facility] ORS;  Service: Orthopedics;  Laterality: N/A;  RADIOLOGY WITH ANESTHESIA N/A 06/19/2021  Procedure: IR WITH ANESTHESIA;  Surgeon: Radiologist, Medication, MD;  Location: [place];  Service: Radiology;  Laterality: N/A; HPI: 87 y/o M who has a PMH as outlined below including but not limited to HTN, HLD, AF not on AC.  He presented to [facility] 12/5 with aphasia and right sided weakness. CTA showed distal left M2 occlusion.  He was brought to [facility] for NIR mechanical thrombectomy.  He did not receive tPA as he was deemed to be outside of the window. Pt has PMHX with Complete tear of left rotator cuff; Essential hypertension; Impingement syndrome of left shoulder; Left hip pain; Left supraspinatus tenosynovitis; Pure hypercholesterolemia; Oral phase dysphagia; Esophageal dysphagia; Esophageal mass; AKI (acute kidney injury) ([place]); Hypernatremia; Cellulitis of left leg; Gastroesophageal reflux disease without esophagitis; Esophageal ulcer; Hypokalemia; AF (paroxysmal atrial fibrillation) ([place]); Cerebrovascular accident (CVA) ([place]); and Acute ischemic left MCA stroke ([place]).  Pt had MBSS in Dec 2017 showing functional oropharyngal swallowing  Subjective: Pt awake, alert, aphasic, confused  Recommendations for follow up therapy are one component of a multi-disciplinary discharge planning process, led by the attending physician.  Recommendations may be updated based on patient status, additional functional criteria and insurance authorization. Assessment / Plan / Recommendation Clinical Impressions 06/20/2021 Clinical Impression Pt presents with functional oropharyngeal swallow.  There was no penetration or aspiration noted with any consistencies.  Pt noted to clear throat in absence of  penetration/aspiration during this evaluation.  During pill simulation pt masticated tablet.  Recommend crushing medicines with puree.  There is a protrusion in the cervical esophagus below the UES which appears to be soft tissue like.  See picture below.  This abnormality does not affect swallow function.  Bolus flow is not impeded and there was no retention of contrast.  Pt has hx of esophageal dysphagia, esophageal ulcer, esophageal mass, and GERD without esophagitis. Consider GI referral for evaluation of esophagus.  Pt has no further ST.  SLP to follow for speech-language evaluation.  SLP Visit Diagnosis Dysphagia, unspecified (R13.10) Attention and concentration deficit following -- Frontal lobe and executive function deficit following -- Impact on safety and function No limitations   Treatment Recommendations 06/20/2021 Treatment Recommendations No treatment recommended at this time   Prognosis 06/20/2021 Prognosis for Safe Diet Advancement (No Data) Barriers to Reach Goals -- Barriers/Prognosis Comment -- Diet Recommendations 06/20/2021 SLP Diet Recommendations Regular solids;Thin liquid Liquid Administration via -- Medication Administration Crushed with puree Compensations Slow rate;Small sips/bites;Minimize environmental distractions Postural Changes Seated upright at 90 degrees   Other Recommendations 06/20/2021 Recommended Consults Consider GI evaluation Oral Care Recommendations Oral care BID  Other Recommendations -- Follow Up Recommendations No SLP follow up Assistance recommended at discharge Frequent or constant Supervision/Assistance Functional Status Assessment Patient has not had a recent decline in their functional status Frequency and Duration  06/20/2021 Speech Therapy Frequency (ACUTE ONLY) (No Data) Treatment Duration --   Oral Phase 06/20/2021 Oral Phase Impaired Oral - Pudding Teaspoon -- Oral - Pudding Cup -- Oral - Honey Teaspoon -- Oral - Honey Cup -- Oral - Nectar Teaspoon -- Oral - Nectar  Cup -- Oral - Nectar Straw -- Oral - Thin Teaspoon -- Oral - Thin Cup WFL Oral - Thin Straw WFL Oral - Puree WFL Oral - Mech Soft -- Oral - Regular WFL Oral - Multi-Consistency -- Oral - Pill -- Oral Phase - Comment --  Pharyngeal Phase 06/20/2021 Pharyngeal Phase -- Pharyngeal- Pudding Teaspoon -- Pharyngeal -- Pharyngeal- Pudding Cup -- Pharyngeal -- Pharyngeal- Honey Teaspoon -- Pharyngeal -- Pharyngeal- Honey Cup -- Pharyngeal -- Pharyngeal- Nectar Teaspoon -- Pharyngeal -- Pharyngeal- Nectar Cup -- Pharyngeal -- Pharyngeal- Nectar Straw -- Pharyngeal -- Pharyngeal- Thin Teaspoon -- Pharyngeal -- Pharyngeal- Thin Cup -- Pharyngeal -- Pharyngeal- Thin Straw -- Pharyngeal -- Pharyngeal- Puree -- Pharyngeal -- Pharyngeal- Mechanical Soft -- Pharyngeal -- Pharyngeal- Regular Delayed swallow initiation-pyriform sinuses Pharyngeal -- Pharyngeal- Multi-consistency -- Pharyngeal -- Pharyngeal- Pill -- Pharyngeal -- Pharyngeal Comment --  Cervical Esophageal Phase  06/20/2021 Cervical Esophageal Phase Impaired Pudding Teaspoon -- Pudding Cup -- Honey Teaspoon -- Honey Cup -- Nectar Teaspoon -- Nectar Cup -- Nectar Straw -- Thin Teaspoon -- Thin Cup -- Thin Straw (No Data) Puree -- Mechanical Soft -- Regular -- Multi-consistency -- Pill -- Cervical Esophageal Comment -- Celedonio Savage , MA, CCC-SLP Acute Rehabilitation Services Office: [phone] 06/20/2021, 1:14 PM                     EEG adult  Result Date: 06/22/2021 Lora Havens, MD     06/22/2021 12:50 PM Patient Name: Martyn Malay. MRN: [id number] Epilepsy Attending: Lora Havens Referring Physician/Provider: Katy Apo, NP Date: 06/22/2021 Duration: 23.08 mins Patient history: 87 year old male with left M2 occlusion status post thrombectomy now with altered mental status.  EEG to evaluate for seizure. Level of alertness: Awake AEDs during EEG study: None Technical aspects: This EEG study was done with scalp electrodes positioned  according to the 10-20 International system of electrode placement. Electrical activity was acquired at a sampling rate of 500Hz  and reviewed with a high frequency filter of 70Hz  and a low frequency filter of 1Hz . EEG data were recorded continuously and digitally stored. Description: The posterior dominant rhythm consists of 8 Hz activity of moderate voltage (25-35 uV) seen predominantly in posterior head regions, symmetric and reactive to eye opening and eye closing.  EEG showed intermittent generalized and lateralized right hemisphere 3 to 6 Hz theta-delta slowing. Hyperventilation and photic stimulation were not performed.   ABNORMALITY - Intermittent slow, generalized and lateralized right hemisphere IMPRESSION: This study is suggestive of cortical dysfunction in right hemisphere as well as mild diffuse encephalopathy, nonspecific etiology.  No seizures or epileptiform discharges were seen throughout the recording. Lora Havens   ECHOCARDIOGRAM COMPLETE  Result Date: 06/20/2021    ECHOCARDIOGRAM REPORT   Patient Name:   Brysen Shankman. Date of Exam: 06/20/2021 Medical Rec #:  [id number]             Height:       69.0 in Accession #:  [id number]            Weight:       148.1 lb Date of Birth:  May 13, 1927              BSA:          1.819 m Patient Age:    40 years              BP:           152/85 mmHg Patient Gender: M                     HR:           94 bpm. Exam Location:  Inpatient Procedure: 2D Echo, Cardiac Doppler and Color Doppler Indications:    TIA  History:        Patient has no prior history of Echocardiogram examinations.  Sonographer:    Merrie Roof RDCS Referring Phys: [id number] [place]  1. Left ventricular ejection fraction, by estimation, is 45%. The left ventricle has mildly decreased function. The left ventricle demonstrates global hypokinesis. Left ventricular diastolic parameters are indeterminate.  2. Right ventricular systolic function is normal. The right  ventricular size is mildly enlarged. There is mildly elevated pulmonary artery systolic pressure. The estimated right ventricular systolic pressure is 37.3 mmHg.  3. Left atrial size was severely dilated.  4. Right atrial size was moderately dilated.  5. The mitral valve is grossly normal. No evidence of mitral valve regurgitation. No evidence of mitral stenosis.  6. The aortic valve was not well visualized. There is moderate calcification of the aortic valve. Aortic valve regurgitation is mild. Aortic valve sclerosis/calcification is present, without any evidence of aortic stenosis.  7. The inferior vena cava is normal in size with <50% respiratory variability, suggesting right atrial pressure of 8 mmHg. Comparison(s): No prior Echocardiogram. FINDINGS  Left Ventricle: Left ventricular ejection fraction, by estimation, is 45%. The left ventricle has mildly decreased function. The left ventricle demonstrates global hypokinesis. The left ventricular internal cavity size was normal in size. There is no left ventricular hypertrophy. Left ventricular diastolic parameters are indeterminate. Right Ventricle: The right ventricular size is mildly enlarged. No increase in right ventricular wall thickness. Right ventricular systolic function is normal. There is mildly elevated pulmonary artery systolic pressure. The tricuspid regurgitant velocity is 2.74 m/s, and with an assumed right atrial pressure of 8 mmHg, the estimated right ventricular systolic pressure is 42.8 mmHg. Left Atrium: Left atrial size was severely dilated. Right Atrium: Right atrial size was moderately dilated. Pericardium: There is no evidence of pericardial effusion. Mitral Valve: The mitral valve is grossly normal. Mild mitral annular calcification. No evidence of mitral valve regurgitation. No evidence of mitral valve stenosis. Tricuspid Valve: The tricuspid valve is normal in structure. Tricuspid valve regurgitation is mild. Aortic Valve: The aortic  valve was not well visualized. There is moderate calcification of the aortic valve. Aortic valve regurgitation is mild. Aortic valve sclerosis/calcification is present, without any evidence of aortic stenosis. Aortic valve mean gradient measures 10.0 mmHg. Aortic valve peak gradient measures 17.6 mmHg. Aortic valve area, by VTI measures 1.24 cm. Pulmonic Valve: The pulmonic valve was normal in structure. Pulmonic valve regurgitation is not visualized. No evidence of pulmonic stenosis. Aorta: The aortic root and ascending aorta are structurally normal, with no evidence of dilitation. Venous: The inferior vena cava is normal in size with less than 50% respiratory variability, suggesting right atrial pressure of 8  mmHg. IAS/Shunts: The atrial septum is grossly normal.  LEFT VENTRICLE PLAX 2D LVIDd:         5.00 cm LVIDs:         3.60 cm LV PW:         0.90 cm LV IVS:        0.80 cm LVOT diam:     2.10 cm LV SV:         44 LV SV Index:   24 LVOT Area:     3.46 cm  RIGHT VENTRICLE          IVC RV Basal diam:  4.40 cm  IVC diam: 2.00 cm RV Mid diam:    4.80 cm LEFT ATRIUM              Index        RIGHT ATRIUM           Index LA diam:        4.20 cm  2.31 cm/m   RA Area:     24.40 cm LA Vol (A2C):   121.0 ml 66.53 ml/m  RA Volume:   83.80 ml  46.08 ml/m LA Vol (A4C):   91.5 ml  50.31 ml/m LA Biplane Vol: 110.0 ml 60.49 ml/m  AORTIC VALVE AV Area (Vmax):    1.36 cm AV Area (Vmean):   1.23 cm AV Area (VTI):     1.24 cm AV Vmax:           210.00 cm/s AV Vmean:          147.000 cm/s AV VTI:            0.355 m AV Peak Grad:      17.6 mmHg AV Mean Grad:      10.0 mmHg LVOT Vmax:         82.70 cm/s LVOT Vmean:        52.300 cm/s LVOT VTI:          0.127 m LVOT/AV VTI ratio: 0.36  AORTA Ao Root diam: 3.40 cm Ao Asc diam:  3.40 cm TRICUSPID VALVE TR Peak grad:   30.0 mmHg TR Vmax:        274.00 cm/s  SHUNTS Systemic VTI:  0.13 m Systemic Diam: 2.10 cm Rudean Haskell MD Electronically signed by Rudean Haskell  MD Signature Date/Time: 06/20/2021/5:02:23 PM    Final    IR PERCUTANEOUS ART THROMBECTOMY/INFUSION INTRACRANIAL INC DIAG ANGIO  Result Date: 06/20/2021 INDICATION: 87 year old male presents for cervical and cerebral angiogram secondary to left M2 occlusion and acute stroke. EXAM: ULTRASOUND-GUIDED ACCESS RIGHT COMMON FEMORAL ARTERY LEFT-SIDED CERVICAL AND CEREBRAL ANGIOGRAM MECHANICAL THROMBECTOMY LEFT M2 ANGIO-SEAL FOR HEMOSTASIS. COMPARISON:  CT imaging same day MEDICATIONS: No intra arterial medications ANESTHESIA/SEDATION: The anesthesia team was present to provide general endotracheal tube anesthesia and for patient monitoring during the procedure. Intubation was performed in biplane/neurointerventional suite. Left radial arterial line was performed by the anesthesia team. Interventional neuro radiology nursing staff was also present. CONTRAST:  80 cc FLUOROSCOPY TIME:  Fluoroscopy Time: 9 minutes 30 seconds (675.8 mGy). COMPLICATIONS: None TECHNIQUE: Informed written consent was obtained from the patient's family after a thorough discussion of the procedural risks, benefits and alternatives. Specific risks discussed include: Bleeding, infection, contrast reaction, kidney injury/failure, need for further procedure/surgery, arterial injury or dissection, embolization to new territory, intracranial hemorrhage (10-15% risk), neurologic deterioration, cardiopulmonary collapse, death. All questions were addressed. Maximal Sterile Barrier Technique was utilized including during the procedure including caps,  mask, sterile gowns, sterile gloves, sterile drape, hand hygiene and skin antiseptic. A timeout was performed prior to the initiation of the procedure. The anesthesia team was present to provide general endotracheal tube anesthesia and for patient monitoring during the procedure. Interventional neuro radiology nursing staff was also present. FINDINGS: Initial Findings: Left common carotid artery: Common  carotid artery with significant tortuosity after the origin from the aortic arch. Independent origin from the aortic arch. No significant atherosclerotic changes. Left external carotid artery: Patent with antegrade flow. Left internal carotid artery: Mild tortuosity of the cervical ICA segment. Moderate atherosclerosis at the origin of the ICA with less than 50% stenosis at the origin and minimal irregularity. Vertical and petrous segment patent with normal course caliber contour. Cavernous segment patent. Clinoid segment patent. Antegrade flow of the ophthalmic artery. Ophthalmic segment patent. Terminus patent. Left MCA: M1 segment patent. Insular and opercular segments patent. The inferior division is occluded just beyond the origin, with TICI 0: No perfusion or antegrade flow beyond site of occlusion of the affected division. Fetal PCOM configuration on the left perfusing the posterior circulation. Left ACA: A1 segment is patent. Patent anterior communicating artery with some cross-filling into the right-sided territory. Completion Findings: Left MCA: TICI 3: Complete perfusion of the territory after restoring flow through the affected inferior M2 division Flat panel CT performed demonstrates no intracranial hemorrhage. PROCEDURE: The anesthesia team was present to provide general endotracheal tube anesthesia and for patient monitoring during the procedure. Intubation was performed in negative pressure [place] in neuro IR holding. Interventional neuro radiology nursing staff was also present. Ultrasound survey of the right inguinal region was performed with images stored and sent to PACs, confirming patency of the right common femoral artery. 11 blade scalpel was used to make a small incision. Blunt dissection was performed with US guidance. A micropuncture needle was used access the right common femoral artery under ultrasound. With excellent arterial blood flow returned, an .018 micro wire was passed through the  needle, observed to enter the abdominal aorta under fluoroscopy. The needle was removed, and a micropuncture sheath was placed over the wire. The inner dilator and wire were removed, and an 035 wire was advanced under fluoroscopy into the abdominal aorta. Five [place] standard sheath was placed. Standard 5 French cavus catheter was advanced over the J wire. Catheter was advanced to the proximal descending thoracic aorta and the wire was removed. Double flush was performed. Davis catheter was then used to select the left common carotid artery. A standard Glidewire was then advanced to the Revere catheter into the cervical ICA. Davis catheter was then advanced on the Glidewire into the distal cervical ICA. Glidewire was removed and contrast injected confirmed a luminal position. Exchange length roadrunner wire was then placed through the [place] catheter in the [place] catheter was removed. The 5 French sheath was removed and a 25cm 53F straight vascular sheath was placed. The dilator was removed and the sheath was flushed. Sheath was attached to pressurized and heparinized saline bag for constant forward flow. A coaxial system was then advanced over the roadrunner 035 wire. This included a 95cm 087 "[place]" balloon guide with coaxial 125cm Berenstein diagnostic catheter. This was advanced to the distal cervical ICA segment. Wire and catheter were then removed. Double flush of the catheter was performed. Angiogram was performed. Road map function was used once the occluded vessel was identified. Copious back flush was performed and the balloon catheter was attached to heparinized and pressurized saline bag for  forward flow. We elected to use a direct first aspiration attempt for the first pass. A zoom 35 aspiration catheter was advanced with a coaxial synchro soft wire through the balloon guide. The aspiration catheter was advanced into the inferior division up to the site of occlusion. When the catheter was just proximal to  the site of occlusion, wire was removed and the aspiration catheter was attached to the proprietary aspiration vacuum system. This was turned on, aspiration of blood was confirmed, and then the catheter was advanced to the site of occlusion until resistance was encountered and cessation of blood flow was observed in the aspiration tubing. We observed approximally 2 minutes of aspiration in this position. The balloon at the balloon guide catheter was then inflated under fluoroscopy for proximal flow arrest. Constant aspiration using the proprietary engine was then performed at the aspiration catheter, as the zoom 35 catheter was gently and slowly withdrawn with fluoroscopic observation. Once the catheter was removed from the balloon guide, free aspiration was confirmed at the hub of the balloon guide catheter, with free blood return confirmed. The balloon was then deflated, and a control angiogram was performed. Restoration of flow was confirmed. Angiogram of the cervical ICA was performed. Balloon guide was then completely removed. The skin at the puncture site was then cleaned with Chlorhexidine. The 8 French sheath was removed and an 34F angioseal was deployed. Flat panel CT was performed. Patient was extubated once the CT was reviewed. Patient tolerated the procedure well and remained hemodynamically stable throughout. No complications were encountered and no significant blood loss encountered. IMPRESSION: Status post ultrasound guided access of right common femoral artery for left-sided cervical/cerebral angiogram and mechanical thrombectomy of left M2 occlusion using direct aspiration technique with 1 pass and achieving TICI 3 flow. Angio-Seal for hemostasis. Signed, Dulcy Fanny. Dellia Nims, RPVI [facility] Radiology PLAN: ICU status Target systolic blood pressure of 120-140 Right hip straight time 4 hours Frequent neurovascular checks Repeat neurologic imaging with  CT and/MRI at the discretion of neurology team Electronically Signed   By: Corrie Mckusick D.O.   On: 06/20/2021 08:19   CT HEAD CODE STROKE WO CONTRAST  Result Date: 06/19/2021 CLINICAL DATA:  Code stroke. EXAM: CT HEAD WITHOUT CONTRAST TECHNIQUE: Contiguous axial images were obtained from the base of the skull through the vertex without intravenous contrast. COMPARISON:  December 2020 FINDINGS: Brain: There is no acute intracranial hemorrhage, mass effect, or edema. No new loss of gray-white differentiation. Confluent areas of hypodensity in the supratentorial white matter are nonspecific but may reflect advanced chronic microvascular ischemic changes. Prominence of the ventricles and sulci reflects parenchymal volume loss. These changes have progressed since 2020. No extra-axial collection. Vascular: No hyperdense vessel. There is intracranial atherosclerotic calcification at the skull base. Skull: Unremarkable. Sinuses/Orbits: No acute finding. Other: Mastoid air cells are clear. ASPECTS ([place] Stroke Program Early CT Score) - Ganglionic level infarction (caudate, lentiform nuclei, internal capsule, insula, M1-M3 cortex): 7 - Supraganglionic infarction (M4-M6 cortex): 3 Total score (0-10 with 10 being normal): 10 IMPRESSION: There is no acute intracranial hemorrhage or evidence of acute infarction. ASPECT score is 10. Parenchymal volume loss and advanced chronic microvascular ischemic changes with progression since 2020. Preliminary results were communicated to Dr. Quinn Axe at 12:24 pm on 06/19/2021 by text page via the [facility] messaging system. Electronically Signed   By: Macy Mis M.D.   On: 06/19/2021 12:29   CT ANGIO HEAD NECK W WO CM W PERF (  CODE STROKE)  Result Date: 06/19/2021 CLINICAL DATA:  Neuro deficit, acute, stroke suspected EXAM: CT ANGIOGRAPHY HEAD AND NECK CT PERFUSION BRAIN TECHNIQUE: Multidetector CT imaging of the head and neck was performed using the standard protocol during bolus  administration of intravenous contrast. Multiplanar CT image reconstructions and MIPs were obtained to evaluate the vascular anatomy. Carotid stenosis measurements (when applicable) are obtained utilizing NASCET criteria, using the distal internal carotid diameter as the denominator. Multiphase CT imaging of the brain was performed following IV bolus contrast injection. Subsequent parametric perfusion maps were calculated using RAPID software. CONTRAST:  122m OMNIPAQUE IOHEXOL 350 MG/ML SOLN COMPARISON:  None. FINDINGS: CTA NECK Aortic arch: Mild mixed plaque. Great vessel origins are patent without high-grade stenosis. Right carotid system: Patent. Eccentric noncalcified plaque along the common carotid with minimal stenosis. Mixed but primarily noncalcified plaque at the proximal ICA causing less than 50% stenosis. Left carotid system: Patent. Noncalcified plaque along the common carotid with minimal stenosis. Mixed but primarily calcified plaque along the proximal internal carotid causing less than 50% stenosis. Vertebral arteries: Patent dominant left vertebral artery. Right vertebral artery is diminutive in caliber but patent to the skull base. Skeleton: Advanced degenerative changes of the cervical spine. Other neck: Unremarkable. Upper chest: Emphysema. Review of the MIP images confirms the above findings CTA HEAD Anterior circulation: Intracranial internal carotid arteries are patent with calcified plaque causing mild stenosis. Left M1 MCA is patent. There is occlusion of distal left M2 posterior division within the posterior left sylvian fissure. Right middle and both anterior cerebral arteries are patent. There is focal moderate stenosis of the right M1 MCA. Posterior circulation: Intracranial left vertebral artery is patent with mild calcified plaque. 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 Basilar artery is patent. Major cerebellar artery origins are patent. A left posterior  communicating artery is present. Posterior cerebral arteries are patent. There is focal moderate to marked stenosis of the distal left P2 PCA. Venous sinuses: Patent as allowed by contrast bolus timing. Review of the MIP images confirms the above findings CT Brain Perfusion Findings: CBF (<30%) Volume: 066mPerfusion (Tmax>6.0s) volume: 9051mismatch Volume: 14m44mfarction Location: Left MCA territory. There is also some involvement of the right frontal periventricular white matter and temporal lobe which is likely artifactual. IMPRESSION: Distal left M2 MCA occlusion within the posterior left sylvian fissure. Perfusion imaging demonstrates no evidence of core infarction. 90 mL of penumbra in the left MCA territory is likely mildly overestimated given some artifactual contralateral contribution. Plaque at the ICA origins causes less than 50% stenosis. Diminutive but patent extracranial right vertebral artery probably on a congenital basis. Apparent occlusion intracranially is probably not acute. The dominant left vertebral artery is patent. Moderate stenosis right M1 MCA. Moderate to marked stenosis distal left P2 PCA. Preliminary results were communicated to Dr. StacQuinn Axe12:50 pm on 06/19/2021 by text page via the [facility]. Electronically Signed   By: PranMacy Mis.   On: 06/19/2021 13:06    Labs:  Basic Metabolic Panel: Recent Labs  Lab 07/17/21 0619 07/18/21 0646  NA 141 140  K 3.4* 3.5  CL 109 110  CO2 22 24  GLUCOSE 97 101*  BUN 17 15  CREATININE 1.12 1.08  CALCIUM 7.8* 7.4*    CBC: Recent Labs  Lab 07/15/21 1635 07/17/21 0619  WBC 6.9 8.7  NEUTROABS 5.6  --   HGB 9.7* 9.7*  HCT 30.1* 29.4*  MCV 92.3 90.2  PLT 185  150    Glucose:  101 on 1/3 97 on 1/2  Brief HPI:   Aiden Helzer. is a 87 y.o. male who present to [facility]  ED on 06/19/2021 with impaired speech and cognition and code stroke was called. Imaging was consistent with acute left MCA  MR occlusion. He was out the window for thrombolysis. Transfer arranged to [facility] for interventional revascularization. This was performed by Dr. Earleen Newport on 06/19/2021. CCM and neurology followed.  Infarct felt likely due to atrial fibrillation. Heparin infusion started on 06/21/2021. This was transitioned to Eliquis. He stablized and was discharged to CIR.   Hospital Course: Jaykob Minichiello. was admitted to rehab 06/29/2021 for inpatient therapies to consist of PT, ST and OT at least three hours five days a week. Past admission physiatrist, therapy team and rehab RN have worked together to provide customized collaborative inpatient rehab.  He was noted to have right hand swelling and pian. X-rays of hand were unremarkable. His Foley catheter was removed but he required multiple in and out catheterizations. Flomax started on 12/19. He had mild elevation of WBC, ESR and CRP therefore orthopedic surgery consultation was obtained and arrangements were made for MRI of hand to rule out osteomyelitis. Negative for joint effusion or osteo/septic arthritis. BUN and Cr drifted up and urine culture was positive for >100,000 colonies of E. Coli and positive for E. Faecalis. He was started on Keflex BID and gentle overnight IVF hydration with 1/2NS. With  He developed gross hematuria and urology was consulted (Dr. Abner Greenspan). 20 french catheter was placed and there was no further hematuria.  This was changed out to 16 french catheter and family was consulted regarding intermittent catheterizations versus in-dwelling Foley. They understand increase chance of UTI with indwelling Foley, but opted for this. Keflex completed on 12/25. He had an unwitnessed fall on 12/29 with skin tear to right knee. Telesitter and Posey belt ordered. On 12/31, he developed fever spike to 101.7. Foley catheter removed and blood and urine cultures drawn. He had increase in confusion and agitation and refusing to take meds. Ativan given.  Nitrofurantoin started. Blood cultures were negative and urine culture grew E. Coli and Enterococcus faecalis. He required in and out cathterization. On the day of discharge, his Foley catheter was replaced.   Blood pressures were monitored on TID basis and metoprolol continued and amlodipine on 06/27/2021  Rehab course: During patient's stay in rehab weekly team conferences were held to monitor patient's progress, set goals and discuss barriers to discharge. At admission, patient required +2 assist, max with basic care skills.  He has had improvement in activity tolerance, balance, postural control as well as ability to compensate for deficits. He has had improvement in functional use RUE/LUE  and RLE/LLE as well as improvement in awareness. He needed 24/7 supervision and discharge ambulatory level CGA/supervision.   Disposition:  Discharge disposition: 01-Home or Self Care        Diet: Heart healthy  Special Instructions:  No driving, alcohol consumption or tobacco use.   Crush medications and administer in pudding or applesauce  Discharge Instructions     Ambulatory referral to Physical Medicine Rehab   Complete by: As directed    Moderate complexity s/p stroke   Discharge patient   Complete by: As directed    Discharge disposition: 01-Home or Self Care   Discharge patient date: 07/18/2021      Allergies as of 07/18/2021       Reactions  Ace Inhibitors    Other reaction(s): Kidney Disorder   Clarithromycin Hives   Etodolac    Other reaction(s): Kidney Disorder   Levofloxacin Swelling        Medication List     TAKE these medications    (feeding supplement) PROSource Plus liquid Take 30 mLs by mouth 2 (two) times daily between meals.   acetaminophen 325 MG tablet Commonly known as: TYLENOL Take 1-2 tablets (325-650 mg total) by mouth every 4 (four) hours as needed for mild pain. What changed:  medication strength how much to take when to take this    amLODipine 5 MG tablet Commonly known as: NORVASC Take 1 tablet (5 mg total) by mouth daily. Start taking on: July 19, 2021   apixaban 5 MG Tabs tablet Commonly known as: ELIQUIS Take 1 tablet (5 mg total) by mouth 2 (two) times daily.   atorvastatin 40 MG tablet Commonly known as: LIPITOR Take 1 tablet (40 mg total) by mouth daily.   Cholecalciferol 25 MCG (1000 UT) capsule Take 1,000 Units by mouth daily.   divalproex 125 MG capsule Commonly known as: DEPAKOTE SPRINKLE Take 1 capsule (125 mg total) by mouth every 8 (eight) hours.   memantine 5 MG tablet Commonly known as: NAMENDA Take 1 tablet (5 mg total) by mouth every morning. Start taking on: July 19, 2021 What changed: when to take this   metoprolol tartrate 50 MG tablet Commonly known as: LOPRESSOR Take 1 tablet (50 mg total) by mouth 2 (two) times daily. What changed:  medication strength how much to take   nitrofurantoin (macrocrystal-monohydrate) 100 MG capsule Commonly known as: MACROBID Take 1 capsule (100 mg total) by mouth every 12 (twelve) hours.   pantoprazole 40 MG tablet Commonly known as: PROTONIX Take 1 tablet (40 mg total) by mouth daily at 12 noon.   polyethylene glycol 17 g packet Commonly known as: MIRALAX / GLYCOLAX Take 17 g by mouth daily as needed for mild constipation.   QUEtiapine 25 MG tablet Commonly known as: SEROQUEL Take 1 tablet (25 mg total) by mouth at bedtime.   QUEtiapine 25 MG tablet Commonly known as: SEROQUEL Take 1 tablet (25 mg total) by mouth daily at 6 (six) AM.       Additional medication:  Flomax 0.4 mg take two daily with supper (phoned to patient's pharmacy 1/3)    Additional follow-up: Dr. Rexene Alberts July 27, 2021 at 8:15 am   Follow-up Information     Rusty Aus, MD Follow up.   Specialty: Internal Medicine Why: Call tomorrow for hospitalization follow-up appointment Contact information: [place] [address] [address] Call.   Why: Call tomorrow for hospitalization for stroke follow-up appointment Contact information: [address]     [place] [phone]        Charlett Blake, MD Follow up.   Specialty: Physical Medicine and Rehabilitation Why: office will call you to arrange your appt (sent) Contact information: [place] [address] [phone]         [facility] Follow up.   Specialty: Hospice and Palliative Medicine Why: Referral order has been placed Contact information: [place] [phone]                Signed: Barbie Banner 07/18/2021, 1:16 PM

## 2021-07-13 NOTE — Progress Notes (Deleted)
Inpatient Rehabilitation Care Coordinator Discharge Note   Patient Details  Name: Devon Elliott. MRN: [id number] Date of Birth: [birth year]   Discharge location: Home  Length of Stay: 19 Days  Discharge activity level: Cga/Min A  Home/community participation: son, family and caregivers  Patient response HE:[id number] Literacy - How often do you need to have someone help you when you read instructions, pamphlets, or other written material from your doctor or pharmacy?: Never  Patient response [id number]:YJEHUD Isolation - How often do you feel lonely or isolated from those around you?:  (per Nurse documentation, pt unable to respond.)  Services provided included: MD, RD, PT, OT, SLP, CM, RN, Pharmacy, TR, SW  Financial Services:  Field seismologist Utilized: Barrister's clerk MEDICARE  Choices offered to/list presented to: Patient daughter in law  Follow-up services arranged:  Home Health Home Health Agency: [facility]         Patient response to transportation need: Is the patient able to respond to transportation needs?: Yes In the past 12 months, has lack of transportation kept you from medical appointments or from getting medications?: No In the past 12 months, has lack of transportation kept you from meetings, work, or from getting things needed for daily living?: No    Comments (or additional information):  Patient/Family verbalized understanding of follow-up arrangements:  Yes  Individual responsible for coordination of the follow-up plan: Onalee Hua [phone]  Confirmed correct DME delivered: Andria Rhein 07/13/2021    Andria Rhein

## 2021-07-13 NOTE — Progress Notes (Signed)
PROGRESS NOTE   Subjective/Complaints: No new complaints this morning Tolerated therapy today Nurse notes less agitation so discussed that telesitter may be d/ced.   ROS: Limited due to language    Objective:   No results found. No results for input(s): WBC, HGB, HCT, PLT in the last 72 hours.  No results for input(s): NA, K, CL, CO2, GLUCOSE, BUN, CREATININE, CALCIUM in the last 72 hours.   Intake/Output Summary (Last 24 hours) at 07/13/2021 1247 Last data filed at 07/13/2021 1142 Gross per 24 hour  Intake 680 ml  Output 2550 ml  Net -1870 ml        Physical Exam: Vital Signs Blood pressure (!) 158/98, pulse 75, temperature 98.4 F (36.9 C), temperature source Oral, resp. rate 19, weight 67.1 kg, SpO2 96 %.  Gen: no distress, normal appearing HEENT: oral mucosa pink and moist, NCAT Cardio: Reg rate Chest: normal effort, normal rate of breathing Abd: soft, non-distended Ext: no edema Psych: impaired understanding, though pleasant, impulsive Skin: No evidence of breakdown, no evidence of rash , erythema RIght 4th digit Neurologic: a little lethargic.aphasic, follows 25% or less of simple one step commands. Cranial nerves II through XII intact, motor strength is 5/5 in left and 3- RIght deltoid, bicep, tricep, grip, 5/5 bilateral hip flexor, knee extensors, ankle dorsiflexor and plantar flexor Musculoskeletal: right hand pain    Assessment/Plan: 1. Functional deficits which require 3+ hours per day of interdisciplinary therapy in a comprehensive inpatient rehab setting. Physiatrist is providing close team supervision and 24 hour management of active medical problems listed below. Physiatrist and rehab team continue to assess barriers to discharge/monitor patient progress toward functional and medical goals  Care Tool:  Bathing    Body parts bathed by patient: Chest, Face, Right upper leg, Left upper leg,  Right lower leg, Left lower leg   Body parts bathed by helper: Right arm, Left arm, Front perineal area, Buttocks, Left lower leg, Right lower leg     Bathing assist Assist Level: Contact Guard/Touching assist     Upper Body Dressing/Undressing Upper body dressing   What is the patient wearing?: Pull over shirt    Upper body assist Assist Level: Minimal Assistance - Patient > 75%    Lower Body Dressing/Undressing Lower body dressing      What is the patient wearing?: Pants     Lower body assist Assist for lower body dressing: Moderate Assistance - Patient 50 - 74% (bed level)     Toileting Toileting    Toileting assist Assist for toileting: 2 Helpers     Transfers Chair/bed transfer  Transfers assist     Chair/bed transfer assist level: Contact Guard/Touching assist Chair/bed transfer assistive device: Walker, Clinical biochemist   Ambulation assist      Assist level: 2 helpers (mod A of 1 and +2 w/c follow) Assistive device: Walker-rolling Max distance: 76f   Walk 10 feet activity   Assist     Assist level: 2 helpers Assistive device: Walker-rolling   Walk 50 feet activity   Assist Walk 50 feet with 2 turns activity did not occur: Safety/medical concerns  Walk 150 feet activity   Assist Walk 150 feet activity did not occur: Safety/medical concerns         Walk 10 feet on uneven surface  activity   Assist Walk 10 feet on uneven surfaces activity did not occur: Safety/medical concerns         Wheelchair     Assist Is the patient using a wheelchair?: Yes Type of Wheelchair: Manual    Wheelchair assist level: Maximal Assistance - Patient 25 - 49% Max wheelchair distance: 150    Wheelchair 50 feet with 2 turns activity    Assist        Assist Level: Maximal Assistance - Patient 25 - 49%   Wheelchair 150 feet activity     Assist      Assist Level: Maximal Assistance - Patient 25 -  49%   Blood pressure (!) 158/98, pulse 75, temperature 98.4 F (36.9 C), temperature source Oral, resp. rate 19, weight 67.1 kg, SpO2 96 %.  Medical Problem List and Plan: 1. Functional deficits secondary to left MCA infarct likely secondary to embolism Righ themiparesis and moderate global aphasia , suspect apraxia as well              -patient may  shower             -ELOS/Goals: 12-14d    -Continue CIR therapies including PT, OT, and SLP   2.  Antithrombotics: -DVT/anticoagulation:  Pharmaceutical: Other (comment)  apixaban 5 mg BID             -antiplatelet therapy: none 3. Pain Management: Tylenol Added diclofenac gel to RIght hand - hand pain as per ortho suspect gout but septic arthritis in DDx await MRI RIght hand  12/23- got MRI no septic arthritis- -will continue regimen 4. Mood: Depakote,              -antipsychotic agents: Seroquel 25 mg daily and q HS, Seroquel 25 mg q 6 hours PRN agitation;  Namenda 5. Neuropsych: This patient is not capable of making decisions on his own behalf. 6. Skin/Wound Care: routine skin checks 7. Fluids/Electrolytes/Nutrition: routine ins and outs with follow-up chemistries. Hypokalemia resolved. 8. Atrial fibrillation: rate controlled. Increase lopressor to 50m BID as in #9, Eliquis 5 mg daily 9. Hypertension: goal 1111-735systolic BP post thrombectomy. Continue metoprolol. Amlodipine added12/13. Increase flomax to 0.873mVitals:   07/13/21 0305 07/13/21 0528  BP: (!) 142/85 (!) 158/98  Pulse: 78 75  Resp: 19 19  Temp: 98.4 F (36.9 C) 98.4 F (36.9 C)  SpO2: 95% 96%  Continue Lopressor to 5044mID   10: Hyperlipidemia: LDL =  110; goal <70. Started on high intensity statin: atorvastatin 40 mg. Continue at discharge. 11: Delirium with history of mild dementia. Continue Depakote, Seroquel and Namenda Reduced comprehension associated with receptive language deficits are contributing  12: HFrEF: currently euvolemic 13: AKI: resolved.  BUN/Cr trending back up   BMP Latest Ref Rng & Units 07/09/2021 07/08/2021 07/07/2021  Glucose 70 - 99 mg/dL 106(H) 118(H) 119(H)  BUN 8 - 23 mg/dL 20 28(H) 32(H)  Creatinine 0.61 - 1.24 mg/dL 1.09 1.09 1.31(H)  Sodium 135 - 145 mmol/L 141 145 144  Potassium 3.5 - 5.1 mmol/L 3.8 3.9 3.7  Chloride 98 - 111 mmol/L 114(H) 114(H) 110  CO2 22 - 32 mmol/L 20(L) 22 25  Calcium 8.9 - 10.3 mg/dL 7.8(L) 7.9(L) 8.3(L)  Some fluctuation in range of 1.0-1.4, cont to monitor , no nephrotoxic meds  identified , negative fluid balance with poor intake , this is likely contributing   12/23- IVFs for elevated Bun of 32- ~ 8-10 points above baseline since here-    12/24 BUN/Cr better but still 28/1.1 today---resume HS fluids 1/2ns at 50cc/hr  12/26- BUN 20 and Cr 1.09- con't HS at night.  14: Urinary retention/bloody clots: Foley cath placed 12/9. Will discontinue in AM. Orders in place for bladder scanning, PVR check and in/out cath if necessary  -blood clots in urine with caths d/t eliquis  -Increase flomax to 0.12m, bp can tolerate  12/22- still requiring in/out caths- bloody/clots- will check with Urology- if they think we need to place a 3 way foley?  12/26- pt wasn't voiding at all- prior to placing catheter- wife want sus to keep foley- not do in/out caths.  15. Leukocytosis, has been bouncing from 7K to 12 K over the last 2 weeks  12.9 on 12/19  -afebrile  12/23- WBC down to 8.7  -  E coli >100K sens to Keflex     16.RIght fourth digit swelling, x-rays show erosive changes likely chronic however given some elevation of white cells as well as elevated ESR and CRP would pursue MRI of the finger/hand right side, appreciate Ortho assistance  12/23- MRI (-) except mild subQ tissue edema 17. Impulsitivity: improved, may d/c telesitter   LOS: 14 days A FACE TO FACE EVALUATION WAS PERFORMED  KClide DeutscherRaulkar 07/13/2021, 12:47 PM

## 2021-07-13 NOTE — Progress Notes (Signed)
°   07/13/21 1710  What Happened  Was fall witnessed? No  Was patient injured? Yes  Patient found on floor  Found by Staff-comment  Stated prior activity to/from bed, chair, or stretcher  Follow Up  MD notified Yes Mariam Dollar, PA)  Time MD notified [phone]  Family notified Yes - comment (Wife)  Time family notified 1700  Additional tests Yes-comment  Simple treatment Other (comment)  Progress note created (see row info) Yes  Adult Fall Risk Assessment  Risk Factor Category (scoring not indicated) High fall risk per protocol (document High fall risk)  Age 87  Fall History: Fall within 6 months prior to admission 0  Elimination; Bowel and/or Urine Incontinence 0  Elimination; Bowel and/or Urine Urgency/Frequency 0  Medications: includes PCA/Opiates, Anti-convulsants, Anti-hypertensives, Diuretics, Hypnotics, Laxatives, Sedatives, and Psychotropics 5  Patient Care Equipment 2  Mobility-Assistance 2  Mobility-Gait 2  Mobility-Sensory Deficit 2  Altered awareness of immediate physical environment 1  Impulsiveness 2  Lack of understanding of one's physical/cognitive limitations 4  Total Score 23  Patient Fall Risk Level High fall risk  Adult Fall Risk Interventions  Required Bundle Interventions *See Row Information* High fall risk - low, moderate, and high requirements implemented  Additional Interventions Reorient/diversional activities with confused patients;Safety Sitter/Safety Rounder;Lap belt while in chair/wheelchair (Rehab only);Camera surveillance (with patient/family notification & education)  Screening for Fall Injury Risk (To be completed on HIGH fall risk patients) - Assessing Need for Floor Mats  Risk For Fall Injury- Criteria for Floor Mats 85 years or older  Will Implement Floor Mats Yes  Vitals  Temp 97.7 F (36.5 C)  BP (!) 133/95  MAP (mmHg) 107  BP Location Left Arm  BP Method Automatic  Patient Position (if appropriate) Lying  Pulse Rate 96  Pulse Rate  Source Monitor  Resp 14  Oxygen Therapy  SpO2 94 %  O2 Device Room Air

## 2021-07-13 NOTE — Progress Notes (Signed)
Occupational Therapy Session Note  Patient Details  Name: Devon Elliott. MRN: [id number] Date of Birth: Jul 16, 1927  Today's Date: 07/13/2021 OT Individual Time: 7622-6333 OT Individual Time Calculation (min): 31 min    Short Term Goals: Week 2:  OT Short Term Goal 1 (Week 2): pt will sit to stand with min A to prepare for transfers. OT Short Term Goal 2 (Week 2): Pt will don shirt with max A. OT Short Term Goal 3 (Week 2): Pt will complete UB bathing with mod A. OT Short Term Goal 4 (Week 2): Pt will actively participate in 2/3 full therapy sessions.  Skilled Therapeutic Interventions/Progress Updates:  Pt handed off from OTR ambulating in hallway, pt agreeable to OT intervention. Session focus on  functional mobility, RUE coordination and FMC and decreasing overall caregiver burden. Pt able to ambulate >100 ft with rw with CGA during session. Pt noted to be pulling at kerlix protecting IV, noted kerlix to be wet, therefore removed dressing during session and donned fresh kerlix to prevent pt from pulling at IV site. Worked on functional grasp with RUE to promote independence with ADL participation. Pt completed peg board task with pt instructed to place pegs in board with RUE however pt wanting to use LUE, pt did use BUE to promote bilateral integration task. Pt completed peg board task with 90% accuracy needing MAX multimodal cues to follow commands.  pt left supine in bed with bed alarm activated and all needs within reach.                      Therapy Documentation Precautions:  Precautions Precautions: Fall, Other (comment) Precaution Comments: SBP<200, aphasia, can be combative Restrictions Weight Bearing Restrictions: No  Pain: no pain reported during session     Therapy/Group: Individual Therapy  Barron Schmid 07/13/2021, 3:54 PM

## 2021-07-13 NOTE — Progress Notes (Signed)
Physical Therapy Session Note  Patient Details  Name: Devon Elliott. MRN: [id number] Date of Birth: Oct 08, 1926  Today's Date: 07/13/2021 PT Individual Time: 0900-0939 PT Individual Time Calculation (min): 39 min   S Week 2:  PT Short Term Goal 1 (Week 2): Pt will ambulate 18f with min assist and LRAD PT Short Term Goal 2 (Week 2): Pt will perform bed mobility with min assist consistenly PT Short Term Goal 3 (Week 2): Family education will be initiated PT Short Term Goal 4 (Week 2): Pt will remain out of bed >2 hours between therapies.  Skilled Therapeutic Interventions/Progress Updates:   Pt received supine in bed. Pt able to be aroused with easy and engaged in conversation. Pt  declined any out of bed activity at this time, despite encouragement from PT. PT obtained dominoes game. With max cues and encouragement, pt instructed in  sustained dominoe game intermittently while semireclined in bed. Noted to be extremely fatigued compared to yesterdays PT treatment requiring max cues for sustained arousal. Pt unable to complete game due to fatigue and continued inability to remain awake when not engaged in active conversation. Pt left supine in bed with call bell in reach and all need met.      Therapy Documentation Precautions:  Precautions Precautions: Fall, Other (comment) Precaution Comments: SBP<200, aphasia, can be combative Restrictions Weight Bearing Restrictions: No   denies     Therapy/Group: Individual Therapy  ALorie Phenix12/29/2022, 9:56 AM

## 2021-07-13 NOTE — Progress Notes (Addendum)
Was the fall witnessed: No (Staff-LAB) Noted patient sitting on the floor.  Patient condition before and after the fall:  Skin tear to right knee Patient denied pain  Patient's reaction to the fall: Patient stated that wasnt that bad.  Name of the doctor that was notified including date and time:  Mariam Dollar, [place] notified at 1658  Any interventions and vital signs: Posey belt, telesitter. T 97.5, R14, P96, B/P 133/95, 02 94% room air

## 2021-07-13 NOTE — Progress Notes (Signed)
Occupational Therapy Session Note  Patient Details  Name: Devon Elliott. MRN: [id number] Date of Birth: 07-19-1926  Today's Date: 07/13/2021 OT Individual Time: 1403-1430 OT Individual Time Calculation (min): 27 min    Short Term Goals: Week 1:  OT Short Term Goal 1 (Week 1): Pt will actively use RUE during self feeding with mod cues. OT Short Term Goal 1 - Progress (Week 1): Not met OT Short Term Goal 2 (Week 1): Pt will demonstrate improved praxis to bathe with min A. OT Short Term Goal 2 - Progress (Week 1): Not met OT Short Term Goal 3 (Week 1): Pt will don shirt with min A. OT Short Term Goal 3 - Progress (Week 1): Not met OT Short Term Goal 4 (Week 1): pt will sit to stand with min A to prepare for transfers. OT Short Term Goal 4 - Progress (Week 1): Not met OT Short Term Goal 5 (Week 1): pt will complete BSC transfers with mod A of 1. OT Short Term Goal 5 - Progress (Week 1): Met Week 2:  OT Short Term Goal 1 (Week 2): pt will sit to stand with min A to prepare for transfers. OT Short Term Goal 2 (Week 2): Pt will don shirt with max A. OT Short Term Goal 3 (Week 2): Pt will complete UB bathing with mod A. OT Short Term Goal 4 (Week 2): Pt will actively participate in 2/3 full therapy sessions.  Skilled Therapeutic Interventions/Progress Updates:    Pt received attempting to exit bed, no s/sx pain throughout session and appears agreeable to therapy. Session focus on self-care retraining, activity tolerance, dynamic standing balance, transfer retraining in prep for improved ADL/IADL/func mobility performance + decreased caregiver burden. Came to sitting EOB with close S and increased time to scoot forward over blankets. Perseverative on "making train." Donned B shoes with max A. Noted to have been incontinent of bowel with poor awareness. Ambulatory bathroom transfer with close S and RW, but pt adamantly declining to sit down. Total A to change out brief and perform pericare. Pt  able to stand at sink to wash hands and comb hair with S.   Ambulated > gym with RW and close S. Able to be redirected to take rest break and pt less agitated/perseverating on "leaving."   Pass off to OTA in hallway for following OT session.   Therapy Documentation Precautions:  Precautions Precautions: Fall, Other (comment) Precaution Comments: SBP<200, aphasia, can be combative Restrictions Weight Bearing Restrictions: No  Pain:   See session note ADL: See Care Tool for more details.  Therapy/Group: Individual Therapy  Volanda Napoleon MS, OTR/L  07/13/2021, 2:41 PM

## 2021-07-13 NOTE — Progress Notes (Signed)
Occupational Therapy Session Note  Patient Details  Name: Devon Elliott. MRN: [id number] Date of Birth: August 03, 1926  Today's Date: 07/13/2021 OT Individual Time: 7741-2878 OT Individual Time Calculation (min): 53 min    Short Term Goals: Week 1:  OT Short Term Goal 1 (Week 1): Pt will actively use RUE during self feeding with mod cues. OT Short Term Goal 1 - Progress (Week 1): Not met OT Short Term Goal 2 (Week 1): Pt will demonstrate improved praxis to bathe with min A. OT Short Term Goal 2 - Progress (Week 1): Not met OT Short Term Goal 3 (Week 1): Pt will don shirt with min A. OT Short Term Goal 3 - Progress (Week 1): Not met OT Short Term Goal 4 (Week 1): pt will sit to stand with min A to prepare for transfers. OT Short Term Goal 4 - Progress (Week 1): Not met OT Short Term Goal 5 (Week 1): pt will complete BSC transfers with mod A of 1. OT Short Term Goal 5 - Progress (Week 1): Met Week 2:  OT Short Term Goal 1 (Week 2): pt will sit to stand with min A to prepare for transfers. OT Short Term Goal 2 (Week 2): Pt will don shirt with max A. OT Short Term Goal 3 (Week 2): Pt will complete UB bathing with mod A. OT Short Term Goal 4 (Week 2): Pt will actively participate in 2/3 full therapy sessions.  Skilled Therapeutic Interventions/Progress Updates:    Pt received asleep in bed, but easily awakened to voice, no s/sx pain throughout session, agreeable to therapy. Session focus on self-care retraining, activity tolerance in prep for improved ADL/IADL/func mobility performance + decreased caregiver burden.  Pt not initiating supine > sitting EOB, resisisting therapist assist with increased time. Pt covering self up with blankets. Total A to don socks bed level, mod A to thread BLE into pants bed level. Pt able to bridge in bed and pull up over hips. Agreeable to eat breakfast.  Set-up A provided and pt able to self-feed breakfast with dominant RUE this date vs his L as in past  sessions under S. Required assist to cut-up food and intermittent cues for error awareness (e.g., pt attempting to drink out of lid of cup vs straw).   Set-up with newspaper at end of session with pt demoing interest/ less perseveration on "flying out today."  Pt left semi-reclined in bed with bed alarm engaged, call bell in reach, and all immediate needs met.    Therapy Documentation Precautions:  Precautions Precautions: Fall, Other (comment) Precaution Comments: SBP<200, aphasia, can be combative Restrictions Weight Bearing Restrictions: No  Pain:  No s/sx throughout session ADL: See Care Tool for more details.  Therapy/Group: Individual Therapy  Volanda Napoleon MS, OTR/L  07/13/2021, 6:55 AM

## 2021-07-14 MED ORDER — AMLODIPINE BESYLATE 2.5 MG PO TABS
2.5000 mg | ORAL_TABLET | Freq: Every day | ORAL | Status: DC
  Administered 2021-07-14 – 2021-07-15 (×2): 2.5 mg via ORAL
  Filled 2021-07-14 (×2): qty 1

## 2021-07-14 NOTE — Progress Notes (Signed)
PROGRESS NOTE   Subjective/Complaints: Fall yesterday- patient has no complaints afterward. Posey bed and tellesitter in place.  No other issues overnight reported Confused this morning as baseline  ROS: Limited due to language    Objective:   No results found. No results for input(s): WBC, HGB, HCT, PLT in the last 72 hours.  No results for input(s): NA, K, CL, CO2, GLUCOSE, BUN, CREATININE, CALCIUM in the last 72 hours.   Intake/Output Summary (Last 24 hours) at 07/14/2021 0928 Last data filed at 07/14/2021 4401 Gross per 24 hour  Intake 3476.08 ml  Output 1650 ml  Net 1826.08 ml        Physical Exam: Vital Signs Blood pressure (!) 159/90, pulse 67, temperature 97.7 F (36.5 C), temperature source Oral, resp. rate 14, weight 67.1 kg, SpO2 97 %. Gen: no distress, normal appearing HEENT: oral mucosa pink and moist, NCAT Cardio: Reg rate Chest: normal effort, normal rate of breathing Abd: soft, non-distended Ext: no edema Psych: impaired understanding, though pleasant, impulsive Skin: No evidence of breakdown, no evidence of rash , erythema RIght 4th digit Neurologic: a little lethargic.aphasic, follows 25% or less of simple one step commands. Cranial nerves II through XII intact, motor strength is 5/5 in left and 3- RIght deltoid, bicep, tricep, grip, 5/5 bilateral hip flexor, knee extensors, ankle dorsiflexor and plantar flexor Musculoskeletal: right hand pain    Assessment/Plan: 1. Functional deficits which require 3+ hours per day of interdisciplinary therapy in a comprehensive inpatient rehab setting. Physiatrist is providing close team supervision and 24 hour management of active medical problems listed below. Physiatrist and rehab team continue to assess barriers to discharge/monitor patient progress toward functional and medical goals  Care Tool:  Bathing    Body parts bathed by patient: Chest, Face,  Right upper leg, Left upper leg, Right lower leg, Left lower leg   Body parts bathed by helper: Right arm, Left arm, Front perineal area, Buttocks, Left lower leg, Right lower leg     Bathing assist Assist Level: Contact Guard/Touching assist     Upper Body Dressing/Undressing Upper body dressing   What is the patient wearing?: Pull over shirt    Upper body assist Assist Level: Minimal Assistance - Patient > 75%    Lower Body Dressing/Undressing Lower body dressing      What is the patient wearing?: Pants     Lower body assist Assist for lower body dressing: Minimal Assistance - Patient > 75%     Toileting Toileting    Toileting assist Assist for toileting: 2 Helpers     Transfers Chair/bed transfer  Transfers assist     Chair/bed transfer assist level: Contact Guard/Touching assist Chair/bed transfer assistive device: Walker, Clinical biochemist   Ambulation assist      Assist level: 2 helpers (mod A of 1 and +2 w/c follow) Assistive device: Walker-rolling Max distance: 15f   Walk 10 feet activity   Assist     Assist level: 2 helpers Assistive device: Walker-rolling   Walk 50 feet activity   Assist Walk 50 feet with 2 turns activity did not occur: Safety/medical concerns  Walk 150 feet activity   Assist Walk 150 feet activity did not occur: Safety/medical concerns         Walk 10 feet on uneven surface  activity   Assist Walk 10 feet on uneven surfaces activity did not occur: Safety/medical concerns         Wheelchair     Assist Is the patient using a wheelchair?: Yes Type of Wheelchair: Manual    Wheelchair assist level: Maximal Assistance - Patient 25 - 49% Max wheelchair distance: 150    Wheelchair 50 feet with 2 turns activity    Assist        Assist Level: Maximal Assistance - Patient 25 - 49%   Wheelchair 150 feet activity     Assist      Assist Level: Maximal  Assistance - Patient 25 - 49%   Blood pressure (!) 159/90, pulse 67, temperature 97.7 F (36.5 C), temperature source Oral, resp. rate 14, weight 67.1 kg, SpO2 97 %.  Medical Problem List and Plan: 1. Functional deficits secondary to left MCA infarct likely secondary to embolism Righ themiparesis and moderate global aphasia , suspect apraxia as well              -patient may  shower             -ELOS/Goals: 12-14d    -Continue CIR therapies including PT, OT, and SLP   2.  Antithrombotics: -DVT/anticoagulation:  Pharmaceutical: Other (comment)  apixaban 5 mg BID             -antiplatelet therapy: none 3. Pain Management: Tylenol Added diclofenac gel to RIght hand - hand pain as per ortho suspect gout but septic arthritis in DDx await MRI RIght hand  12/23- got MRI no septic arthritis- -will continue regimen 4. Mood: Depakote,              -antipsychotic agents: Seroquel 25 mg daily and q HS, Seroquel 25 mg q 6 hours PRN agitation;  Namenda 5. Neuropsych: This patient is not capable of making decisions on his own behalf. 6. Skin/Wound Care: routine skin checks 7. Fluids/Electrolytes/Nutrition: routine ins and outs with follow-up chemistries. Hypokalemia resolved. 8. Atrial fibrillation: rate controlled. Increase lopressor to 26m BID as in #9, Eliquis 5 mg daily 9. Hypertension: goal 1983-382systolic BP post thrombectomy. Continue metoprolol. Add amlodipine 2.551mdaily. Increase flomax to 0.39m40mitals:   07/13/21 2304 07/14/21 0356  BP: (!) 147/87 (!) 159/90  Pulse: 78 67  Resp: 14   Temp: 98 F (36.7 C) 97.7 F (36.5 C)  SpO2: 95% 97%  Continue Lopressor to 38m73mD   10: Hyperlipidemia: LDL =  110; goal <70. Started on high intensity statin: atorvastatin 40 mg. Continue at discharge. 11: Delirium with history of mild dementia. Continue Depakote, Seroquel and Namenda Reduced comprehension associated with receptive language deficits are contributing  12: HFrEF: currently  euvolemic 13: AKI: resolved. BUN/Cr trending back up   BMP Latest Ref Rng & Units 07/09/2021 07/08/2021 07/07/2021  Glucose 70 - 99 mg/dL 106(H) 118(H) 119(H)  BUN 8 - 23 mg/dL 20 28(H) 32(H)  Creatinine 0.61 - 1.24 mg/dL 1.09 1.09 1.31(H)  Sodium 135 - 145 mmol/L 141 145 144  Potassium 3.5 - 5.1 mmol/L 3.8 3.9 3.7  Chloride 98 - 111 mmol/L 114(H) 114(H) 110  CO2 22 - 32 mmol/L 20(L) 22 25  Calcium 8.9 - 10.3 mg/dL 7.8(L) 7.9(L) 8.3(L)  Some fluctuation in range of 1.0-1.4, cont to monitor , no  nephrotoxic meds identified , negative fluid balance with poor intake , this is likely contributing   12/23- IVFs for elevated Bun of 32- ~ 8-10 points above baseline since here-    12/24 BUN/Cr better but still 28/1.1 today---resume HS fluids 1/2ns at 50cc/hr  12/26- BUN 20 and Cr 1.09- con't HS at night. Repeat Creatinine on Monday. 14: Urinary retention/bloody clots: Foley cath placed 12/9. Will discontinue in AM. Orders in place for bladder scanning, PVR check and in/out cath if necessary  -blood clots in urine with caths d/t eliquis  -Increase flomax to 0.52m, bp can tolerate  12/22- still requiring in/out caths- bloody/clots- will check with Urology- if they think we need to place a 3 way foley?  12/26- pt wasn't voiding at all- prior to placing catheter- wife want sus to keep foley- not do in/out caths.  15. Leukocytosis, has been bouncing from 7K to 12 K over the last 2 weeks  12.9 on 12/19  -afebrile  12/23- WBC down to 8.7  -  E coli >100K sens to Keflex     16.RIght fourth digit swelling, x-rays show erosive changes likely chronic however given some elevation of white cells as well as elevated ESR and CRP would pursue MRI of the finger/hand right side, appreciate Ortho assistance  12/23- MRI (-) except mild subQ tissue edema 17. Impulsitivity/s/p fall: Posey bed and telesitter.    LOS: 15 days A FACE TO FACE EVALUATION WAS PERFORMED  KClide DeutscherRaulkar 07/14/2021, 9:28 AM

## 2021-07-14 NOTE — Progress Notes (Signed)
Physical Therapy Session Note  Patient Details  Name: Devon Elliott. MRN: [id number] Date of Birth: 1927-05-08  Today's Date: 07/14/2021 PT Individual Time: 1702-1711 PT Individual Time Calculation (min): 9 min   Short Term Goals:  Week 2:  PT Short Term Goal 1 (Week 2): Pt will ambulate 16f with min assist and LRAD PT Short Term Goal 2 (Week 2): Pt will perform bed mobility with min assist consistenly PT Short Term Goal 3 (Week 2): Family education will be initiated PT Short Term Goal 4 (Week 2): Pt will remain out of bed >2 hours between therapies.  Skilled Therapeutic Interventions/Progress Updates:   Pt received supine in bed with alarm bet in place. Pt requesting help to mail shoes to warehouse. PT attempted to orient pt to situation, but unable to be redirected. Pt then asking to speak to wife on phone. PT called wife on phone, and pt left supine in bed with all needs met speaking to wife.        Therapy Documentation Precautions:  Precautions Precautions: Fall, Other (comment) Precaution Comments: SBP<200, aphasia, can be combative Restrictions Weight Bearing Restrictions: No  Vital Signs: Therapy Vitals Temp: 97.8 F (36.6 C) Pulse Rate: 89 Resp: 18 BP: (!) 137/94 Patient Position (if appropriate): Lying Oxygen Therapy SpO2: 98 % O2 Device: Room Air Pain:  denies   Therapy/Group: Individual Therapy  ALorie Phenix12/30/2022, 6:02 PM

## 2021-07-14 NOTE — Progress Notes (Signed)
Occupational Therapy Session Note  Patient Details  Name: Devon Elliott. MRN: [id number] Date of Birth: 07-15-1927  Today's Date: 07/14/2021 OT Individual Time: 0702-0756 OT Individual Time Calculation (min): 54 min    Short Term Goals: Week 1:  OT Short Term Goal 1 (Week 1): Pt will actively use RUE during self feeding with mod cues. OT Short Term Goal 1 - Progress (Week 1): Not met OT Short Term Goal 2 (Week 1): Pt will demonstrate improved praxis to bathe with min A. OT Short Term Goal 2 - Progress (Week 1): Not met OT Short Term Goal 3 (Week 1): Pt will don shirt with min A. OT Short Term Goal 3 - Progress (Week 1): Not met OT Short Term Goal 4 (Week 1): pt will sit to stand with min A to prepare for transfers. OT Short Term Goal 4 - Progress (Week 1): Not met OT Short Term Goal 5 (Week 1): pt will complete BSC transfers with mod A of 1. OT Short Term Goal 5 - Progress (Week 1): Met Week 2:  OT Short Term Goal 1 (Week 2): pt will sit to stand with min A to prepare for transfers. OT Short Term Goal 2 (Week 2): Pt will don shirt with max A. OT Short Term Goal 3 (Week 2): Pt will complete UB bathing with mod A. OT Short Term Goal 4 (Week 2): Pt will actively participate in 2/3 full therapy sessions.  Skilled Therapeutic Interventions/Progress Updates:    Pt received awake in bed, no s/sx pain throughout and, agreeable to therapy. Session focus on self-care retraining, activity tolerance, transfer retraining in prep for improved ADL/IADL/func mobility performance + decreased caregiver burden. Came to sitting EOB with close S and cues to wait for therapist assist. Doffed shirt with S and able to wash face. Unable to redirect to wash rest of body as pt impulsive and standing up throughout. Redirected to sit down to decrease falls risk during dressing. Donned shirt with light min A to thread RUE. Donned pants with min A to thread BLE and to manage foley bag. Pt able to don B shoes with  S, as well seated EOB. Ambulated throughout session with RW and CGA. Standing at sink, completed oral and hair care with S and set-up of materials. Pt brushed hair x2 due to forgetting he just had completed it.  Set-up A provided for breakfast materials and pt able to consume with distant S.  Pt left seated in recliner with with safety belt alarm engaged, call bell in reach, and all immediate needs met.    Therapy Documentation Precautions:  Precautions Precautions: Fall, Other (comment) Precaution Comments: SBP<200, aphasia, can be combative Restrictions Weight Bearing Restrictions: No  Pain: no s/sx throughout   ADL: See Care Tool for more details.   Therapy/Group: Individual Therapy  Volanda Napoleon MS, OTR/L  07/14/2021, 6:52 AM

## 2021-07-14 NOTE — Progress Notes (Signed)
Physical Therapy Session Note  Patient Details  Name: Devon Elliott. MRN: [id number] Date of Birth: 10-15-1926  Today's Date: 07/14/2021 PT Individual Time: 1305-1359 PT Individual Time Calculation (min): 54 min   Short Term Goals: Week 2:  PT Short Term Goal 1 (Week 2): Pt will ambulate 37ft with min assist and LRAD PT Short Term Goal 2 (Week 2): Pt will perform bed mobility with min assist consistenly PT Short Term Goal 3 (Week 2): Family education will be initiated PT Short Term Goal 4 (Week 2): Pt will remain out of bed >2 hours between therapies.  Skilled Therapeutic Interventions/Progress Updates:  Patient reclined in recliner on entrance to room. Patient alert and agreeable to adjustment of positioning. Then willing to get up with therapist. Pt is agitated once up and perseverates on catching train.  Patient with no pain complaint throughout session.  Therapeutic Activity: Bed Mobility: Patient performed sit-->supine with supervision/ Mod I. VC/ tc required for initiation and attn to task. Transfers: Patient performed sit<>stand and stand pivot transfers throughout session with supervision and CGA for safety/ positioning. Provided verbal cues for attn to task, positioning..  Gait Training:  Patient ambulated short in-room distances and longer distances in hallway and back using RW with close supervision and max vc/ tc for directionality, upright posture, ficus on task and goal.   Neuromuscular Re-ed: NMR facilitated during session with focus on standing balance. Pt guided in functional reaching activity in order to promote safe proactive and reactive balance. NMR performed for improvements in motor control and coordination, balance, sequencing, judgement, and self confidence/ efficacy in performing all aspects of mobility at highest level of independence.   Patient supine  in bed at end of session with brakes locked, bed alarm set, and all needs within reach. NT notified a  to pt's positioning.      Therapy Documentation Precautions:  Precautions Precautions: Fall, Other (comment) Precaution Comments: SBP<200, aphasia, can be combative Restrictions Weight Bearing Restrictions: No General:   Pain:  No complaint of pain this session.  Therapy/Group: Individual Therapy  Loel Dubonnet PT, DPT 07/14/2021, 5:26 PM

## 2021-07-14 NOTE — Progress Notes (Signed)
Physical Therapy Session Note  Patient Details  Name: Devon Elliott. MRN: [id number] Date of Birth: February 08, 1927  Today's Date: 07/14/2021 PT Individual Time: 0905-1000 PT Individual Time Calculation (min): 55 min   Short Term Goals:  Week 2:  PT Short Term Goal 1 (Week 2): Pt will ambulate 89ft with min assist and LRAD PT Short Term Goal 2 (Week 2): Pt will perform bed mobility with min assist consistenly PT Short Term Goal 3 (Week 2): Family education will be initiated PT Short Term Goal 4 (Week 2): Pt will remain out of bed >2 hours between therapies.   Skilled Therapeutic Interventions/Progress Updates:   Pt received ambulating in hall with NT. PT took over care of patient. Session focused on sustained attention, safety with gait, and improved awareness to situation. Gait through hall with RW x 266ft, 334ft, 164ft, and 464ft with CGA/supervision assist from PT. Cues for improved posture, attention to task, management of RW in turns. Through gait training, pt perseverating on need to find train in order to get to [place]; asking PT and any other hospital staff "can you help me find the train?" PT attempted to redirect pt to orient to location with verbal cues and use of signs throughout hospital that read. Pt noted to read sign properly, able to affirm that he was in the hospital, but fter 154sec, wound continue to perseverate on need to find train. Pt also performed WC mobility with BLE 2x 50ft due to reported faitgue. Pt agreeable to transfer to to recliner at end of session to engage in phone call with wife. Supervision assist to transfer with recliner without AD and max cues for attention to task, requiring significant amount of time to sit into chair, and stop perseverating on donning blanket like a robe. Left sitting in WC with call bell in reach and alarm set. RN aware of pt position.       Therapy Documentation Precautions:  Precautions Precautions: Fall, Other  (comment) Precaution Comments: SBP<200, aphasia, can be combative Restrictions Weight Bearing Restrictions: No   Pain:  denies    Therapy/Group: Individual Therapy  Golden Pop 07/14/2021, 1:44 PM

## 2021-07-15 ENCOUNTER — Inpatient Hospital Stay (HOSPITAL_COMMUNITY): Payer: Medicare Other

## 2021-07-15 LAB — CBC WITH DIFFERENTIAL/PLATELET
Abs Immature Granulocytes: 0.02 10*3/uL (ref 0.00–0.07)
Basophils Absolute: 0.1 10*3/uL (ref 0.0–0.1)
Basophils Relative: 1 %
Eosinophils Absolute: 0.1 10*3/uL (ref 0.0–0.5)
Eosinophils Relative: 2 %
HCT: 30.1 % — ABNORMAL LOW (ref 39.0–52.0)
Hemoglobin: 9.7 g/dL — ABNORMAL LOW (ref 13.0–17.0)
Immature Granulocytes: 0 %
Lymphocytes Relative: 7 %
Lymphs Abs: 0.5 10*3/uL — ABNORMAL LOW (ref 0.7–4.0)
MCH: 29.8 pg (ref 26.0–34.0)
MCHC: 32.2 g/dL (ref 30.0–36.0)
MCV: 92.3 fL (ref 80.0–100.0)
Monocytes Absolute: 0.6 10*3/uL (ref 0.1–1.0)
Monocytes Relative: 9 %
Neutro Abs: 5.6 10*3/uL (ref 1.7–7.7)
Neutrophils Relative %: 81 %
Platelets: 185 10*3/uL (ref 150–400)
RBC: 3.26 MIL/uL — ABNORMAL LOW (ref 4.22–5.81)
RDW: 15 % (ref 11.5–15.5)
WBC: 6.9 10*3/uL (ref 4.0–10.5)
nRBC: 0 % (ref 0.0–0.2)

## 2021-07-15 LAB — URINALYSIS, ROUTINE W REFLEX MICROSCOPIC
Bilirubin Urine: NEGATIVE
Glucose, UA: NEGATIVE mg/dL
Ketones, ur: NEGATIVE mg/dL
Nitrite: POSITIVE — AB
Protein, ur: 100 mg/dL — AB
Specific Gravity, Urine: 1.013 (ref 1.005–1.030)
WBC, UA: >50 WBC/hpf — ABNORMAL HIGH (ref 0–5)
pH: 5 (ref 5.0–8.0)

## 2021-07-15 LAB — PROCALCITONIN: Procalcitonin: <0.1 ng/mL

## 2021-07-15 MED ORDER — NITROFURANTOIN MONOHYD MACRO 100 MG PO CAPS
100.0000 mg | ORAL_CAPSULE | Freq: Two times a day (BID) | ORAL | Status: DC
  Administered 2021-07-15 – 2021-07-18 (×6): 100 mg via ORAL
  Filled 2021-07-15 (×6): qty 1

## 2021-07-15 MED ORDER — CEPHALEXIN 250 MG PO CAPS
500.0000 mg | ORAL_CAPSULE | Freq: Two times a day (BID) | ORAL | Status: DC
  Filled 2021-07-15: qty 2

## 2021-07-15 MED ORDER — CHLORHEXIDINE GLUCONATE CLOTH 2 % EX PADS: 5.0000 | MEDICATED_PAD | Freq: Two times a day (BID) | CUTANEOUS | Status: DC

## 2021-07-15 MED ORDER — AMLODIPINE BESYLATE 5 MG PO TABS
5.0000 mg | ORAL_TABLET | Freq: Every day | ORAL | Status: DC
  Administered 2021-07-16 – 2021-07-18 (×3): 5 mg via ORAL
  Filled 2021-07-15 (×3): qty 1

## 2021-07-15 NOTE — Progress Notes (Signed)
Occupational Therapy Weekly Progress Note  Patient Details  Name: Devon Elliott. MRN: [id number] Date of Birth: January 13, 1927  Beginning of progress report period: July 08, 2021 End of progress report period: July 15, 2021   Patient has met 3 of 4 short term goals.  Pt has made good progress this week towards LTG. Pt has demonstrated improved activity tolerance, alertness, participation in therapy, functional use of RUE, dynamic standing balance to presently complete UB dressing at min A level, self-feeding with S, LB dressing with min to mod A and ambulatory bathroom transfers with CGA and RW. Continues to require total A for toileting tasks due to poor awareness and up to max A for EOB bathing. Pt cont to be primarily limited by expressive/receptive language and cognitive deficits, confusion, and disorientation. Family education completed with DIL and step-son on 12/28. Anticipate 24/7 S and min to mod physical assist for ADL required upon DC.   Patient continues to demonstrate the following deficits: muscle weakness, decreased cardiorespiratoy endurance, unbalanced muscle activation, decreased coordination, and decreased motor planning, decreased motor planning and ideational apraxia, decreased initiation, decreased attention, decreased awareness, decreased problem solving, decreased safety awareness, decreased memory, and delayed processing, and decreased sitting balance, decreased standing balance, decreased postural control, and decreased balance strategies and therefore will continue to benefit from skilled OT intervention to enhance overall performance with BADL and Reduce care partner burden.  Patient progressing toward long term goals..  Continue plan of care.  OT Short Term Goals Week 1:  OT Short Term Goal 1 (Week 1): Pt will actively use RUE during self feeding with mod cues. OT Short Term Goal 1 - Progress (Week 1): Not met OT Short Term Goal 2 (Week 1): Pt will  demonstrate improved praxis to bathe with min A. OT Short Term Goal 2 - Progress (Week 1): Not met OT Short Term Goal 3 (Week 1): Pt will don shirt with min A. OT Short Term Goal 3 - Progress (Week 1): Not met OT Short Term Goal 4 (Week 1): pt will sit to stand with min A to prepare for transfers. OT Short Term Goal 4 - Progress (Week 1): Not met OT Short Term Goal 5 (Week 1): pt will complete BSC transfers with mod A of 1. OT Short Term Goal 5 - Progress (Week 1): Met Week 2:  OT Short Term Goal 1 (Week 2): pt will sit to stand with min A to prepare for transfers. OT Short Term Goal 1 - Progress (Week 2): Met OT Short Term Goal 2 (Week 2): Pt will don shirt with max A. OT Short Term Goal 2 - Progress (Week 2): Met OT Short Term Goal 3 (Week 2): Pt will complete UB bathing with mod A. OT Short Term Goal 3 - Progress (Week 2): Not met OT Short Term Goal 4 (Week 2): Pt will actively participate in 2/3 full therapy sessions. OT Short Term Goal 4 - Progress (Week 2): Met Week 3:  OT Short Term Goal 1 (Week 3): STG = LTG 2/2 ELOS  Volanda Napoleon MS, OTR/L  07/15/2021, 2:50 PM

## 2021-07-15 NOTE — Progress Notes (Signed)
°   07/15/21 1537  Assess: MEWS Score  Temp (!) 101.7 F (38.7 C)  BP (!) 174/98  Pulse Rate 80  Resp 19  SpO2 97 %  O2 Device Room Air  Assess: MEWS Score  MEWS Temp 2  MEWS Systolic 0  MEWS Pulse 0  MEWS RR 0  MEWS LOC 0  MEWS Score 2  MEWS Score Color Yellow  Assess: if the MEWS score is Yellow or Red  Were vital signs taken at a resting state? Yes  Focused Assessment Change from prior assessment (see assessment flowsheet)  Early Detection of Sepsis Score *See Row Information* Medium  MEWS guidelines implemented *See Row Information* Yes  Treat  MEWS Interventions Escalated (See documentation below)  Pain Scale 0-10  Pain Score 0  Faces Pain Scale 0  Take Vital Signs  Increase Vital Sign Frequency  Yellow: Q 2hr X 2 then Q 4hr X 2, if remains yellow, continue Q 4hrs  Notify: Charge Nurse/RN  Name of Charge Nurse/RN Notified Ed Priscille Heidelberg RN  Date Charge Nurse/RN Notified 07/15/21  Time Charge Nurse/RN Notified 1545  Notify: Provider  Provider Name/Title Raulkar RN  Date Provider Notified 07/15/21  Time Provider Notified 1553  Notification Type Call  Notification Reason Change in status  Provider response See new orders  Date of Provider Response 07/15/21  Time of Provider Response 1553  Document  Patient Outcome Other (Comment) (continue POC, proceed with new orders and MEWS protocol)  Progress note created (see row info) Yes

## 2021-07-15 NOTE — Progress Notes (Signed)
PROGRESS NOTE   Subjective/Complaints: No new complaints Sleepy Patient's chart reviewed- No issues reported overnight BP still elevated  ROS: Limited due to language    Objective:   No results found. No results for input(s): WBC, HGB, HCT, PLT in the last 72 hours.  No results for input(s): NA, K, CL, CO2, GLUCOSE, BUN, CREATININE, CALCIUM in the last 72 hours.   Intake/Output Summary (Last 24 hours) at 07/15/2021 1518 Last data filed at 07/15/2021 1040 Gross per 24 hour  Intake 397 ml  Output 1950 ml  Net -1553 ml        Physical Exam: Vital Signs Blood pressure (!) 154/103, pulse 83, temperature 97.9 F (36.6 C), resp. rate 18, weight 67.1 kg, SpO2 97 %. Gen: no distress, normal appearing HEENT: oral mucosa pink and moist, NCAT Cardio: Reg rate Chest: normal effort, normal rate of breathing Abd: soft, non-distended Ext: no edema Psych: impaired understanding, though pleasant, impulsive Skin: No evidence of breakdown, no evidence of rash , erythema RIght 4th digit Neurologic: a little lethargic.aphasic, follows 25% or less of simple one step commands. Cranial nerves II through XII intact, motor strength is 5/5 in left and 3- RIght deltoid, bicep, tricep, grip, 5/5 bilateral hip flexor, knee extensors, ankle dorsiflexor and plantar flexor Musculoskeletal: right hand pain    Assessment/Plan: 1. Functional deficits which require 3+ hours per day of interdisciplinary therapy in a comprehensive inpatient rehab setting. Physiatrist is providing close team supervision and 24 hour management of active medical problems listed below. Physiatrist and rehab team continue to assess barriers to discharge/monitor patient progress toward functional and medical goals  Care Tool:  Bathing    Body parts bathed by patient: Chest, Face, Right upper leg, Left upper leg, Right lower leg, Left lower leg   Body parts bathed by  helper: Right arm, Left arm, Front perineal area, Buttocks, Left lower leg, Right lower leg     Bathing assist Assist Level: Contact Guard/Touching assist     Upper Body Dressing/Undressing Upper body dressing   What is the patient wearing?: Pull over shirt    Upper body assist Assist Level: Minimal Assistance - Patient > 75%    Lower Body Dressing/Undressing Lower body dressing      What is the patient wearing?: Pants     Lower body assist Assist for lower body dressing: Minimal Assistance - Patient > 75%     Toileting Toileting    Toileting assist Assist for toileting: 2 Helpers     Transfers Chair/bed transfer  Transfers assist     Chair/bed transfer assist level: Contact Guard/Touching assist Chair/bed transfer assistive device: Walker, Clinical biochemist   Ambulation assist      Assist level: 2 helpers (mod A of 1 and +2 w/c follow) Assistive device: Walker-rolling Max distance: 56f   Walk 10 feet activity   Assist     Assist level: 2 helpers Assistive device: Walker-rolling   Walk 50 feet activity   Assist Walk 50 feet with 2 turns activity did not occur: Safety/medical concerns         Walk 150 feet activity   Assist Walk 150 feet activity did  not occur: Safety/medical concerns         Walk 10 feet on uneven surface  activity   Assist Walk 10 feet on uneven surfaces activity did not occur: Safety/medical concerns         Wheelchair     Assist Is the patient using a wheelchair?: Yes Type of Wheelchair: Manual    Wheelchair assist level: Maximal Assistance - Patient 25 - 49% Max wheelchair distance: 150    Wheelchair 50 feet with 2 turns activity    Assist        Assist Level: Maximal Assistance - Patient 25 - 49%   Wheelchair 150 feet activity     Assist      Assist Level: Maximal Assistance - Patient 25 - 49%   Blood pressure (!) 154/103, pulse 83, temperature 97.9 F (36.6  C), resp. rate 18, weight 67.1 kg, SpO2 97 %.  Medical Problem List and Plan: 1. Functional deficits secondary to left MCA infarct likely secondary to embolism Righ themiparesis and moderate global aphasia , suspect apraxia as well              -patient may  shower             -ELOS/Goals: 12-14d    -Continue CIR therapies including PT, OT, and SLP   2.  Antithrombotics: -DVT/anticoagulation:  Pharmaceutical: Other (comment)  apixaban 5 mg BID             -antiplatelet therapy: none 3. Pain: continue Tylenol Added diclofenac gel to RIght hand - hand pain as per ortho suspect gout but septic arthritis in DDx await MRI RIght hand  12/23- got MRI no septic arthritis- -will continue regimen 4. Agitation: Telesitter for safety, Depakote,              -antipsychotic agents: Seroquel 25 mg daily and q HS, Seroquel 25 mg q 6 hours PRN agitation;  Namenda 5. Neuropsych: This patient is not capable of making decisions on his own behalf. 6. Skin/Wound Care: routine skin checks 7. Fluids/Electrolytes/Nutrition: routine ins and outs with follow-up chemistries. Hypokalemia resolved. 8. Atrial fibrillation: rate controlled. Increase lopressor to 851m BID as in #9, Eliquis 5 mg daily 9. Hypertension: goal 1329-518systolic BP post thrombectomy. Continue metoprolol. Increase amlodipine to 557m Increase flomax to 0.51m60mitals:   07/15/21 0618 07/15/21 1500  BP: (!) 157/88 (!) 154/103  Pulse: 73 83  Resp: 16 18  Temp: 97.6 F (36.4 C) 97.9 F (36.6 C)  SpO2: 91% 97%  Continue Lopressor to 51m51mD   10: Hyperlipidemia: LDL =  110; goal <70. Started on high intensity statin: atorvastatin 40 mg. Continue at discharge. 11: Delirium with history of mild dementia. Continue Depakote, Seroquel and Namenda Reduced comprehension associated with receptive language deficits are contributing  12: HFrEF: currently euvolemic 13: AKI: resolved. BUN/Cr trending back up   BMP Latest Ref Rng & Units 07/09/2021  07/08/2021 07/07/2021  Glucose 70 - 99 mg/dL 106(H) 118(H) 119(H)  BUN 8 - 23 mg/dL 20 28(H) 32(H)  Creatinine 0.61 - 1.24 mg/dL 1.09 1.09 1.31(H)  Sodium 135 - 145 mmol/L 141 145 144  Potassium 3.5 - 5.1 mmol/L 3.8 3.9 3.7  Chloride 98 - 111 mmol/L 114(H) 114(H) 110  CO2 22 - 32 mmol/L 20(L) 22 25  Calcium 8.9 - 10.3 mg/dL 7.8(L) 7.9(L) 8.3(L)  Some fluctuation in range of 1.0-1.4, cont to monitor , no nephrotoxic meds identified , negative fluid balance with poor intake , this  is likely contributing   12/23- IVFs for elevated Bun of 32- ~ 8-10 points above baseline since here-    12/24 BUN/Cr better but still 28/1.1 today---resume HS fluids 1/2ns at 50cc/hr  12/26- BUN 20 and Cr 1.09- con't HS at night. Repeat Creatinine on Monday. 14: Urinary retention/bloody clots: Foley cath placed 12/9. Will discontinue in AM. Orders in place for bladder scanning, PVR check and in/out cath if necessary  -blood clots in urine with caths d/t eliquis  -Increase flomax to 0.53m, bp can tolerate  12/22- still requiring in/out caths- bloody/clots- will check with Urology- if they think we need to place a 3 way foley?  12/26- pt wasn't voiding at all- prior to placing catheter- wife want sus to keep foley- not do in/out caths.  15. Leukocytosis, has been bouncing from 7K to 12 K over the last 2 weeks  12.9 on 12/19  -afebrile  12/23- WBC down to 8.7  -  E coli >100K sens to Keflex     16.RIght fourth digit swelling, x-rays show erosive changes likely chronic however given some elevation of white cells as well as elevated ESR and CRP would pursue MRI of the finger/hand right side, appreciate Ortho assistance  12/23- MRI (-) except mild subQ tissue edema 17. Impulsitivity/s/p fall: Posey bed and telesitter.    LOS: 16 days A FACE TO FACE EVALUATION WAS PERFORMED  Devon Elliott 07/15/2021, 3:18 PM

## 2021-07-15 NOTE — Progress Notes (Signed)
Foley cath removed per order. Pt tolerated well.tylenol administered for fever. Mylo Red, LPN

## 2021-07-16 MED ORDER — LORAZEPAM 2 MG/ML IJ SOLN
0.5000 mg | Freq: Four times a day (QID) | INTRAMUSCULAR | Status: DC | PRN
  Administered 2021-07-16: 0.5 mg via INTRAVENOUS
  Filled 2021-07-16: qty 1

## 2021-07-16 NOTE — Progress Notes (Signed)
Physical Therapy Session Note  Patient Details  Name: Devon Elliott. MRN: [id number] Date of Birth: 01-Feb-1927  Today's Date: 07/16/2021 PT Individual Time: 0900-1000 PT Individual Time Calculation (min): 60 min   Short Term Goals: Week 2:  PT Short Term Goal 1 (Week 2): Pt will ambulate 50ft with min assist and LRAD PT Short Term Goal 2 (Week 2): Pt will perform bed mobility with min assist consistenly PT Short Term Goal 3 (Week 2): Family education will be initiated PT Short Term Goal 4 (Week 2): Pt will remain out of bed >2 hours between therapies.  Skilled Therapeutic Interventions/Progress Updates:    Pt received supine in bed, agreeable to therapy session and requesting to get up and wash his face at the sink. Supine to sit with min A with increased time needed to complete transfer. Assisted pt with donning clean scrub pants while seated EOB, mod A to thread BLE into pant legs. Pt able to pull pants up over hips in standing with RW, CGA for sit to stand transfer to RW during session. Stand pivot transfer to w/c with RW and CGA. Pt is mod A to doff soiled scrub top and don new scrub top. Pt requesting to don his shoes. Placed shoes on floor in front of pt and encouraged him to scoot hip back in chair prior to donning shoes. Pt unable to follow cues safely and stands up and performs stand pivot transfer back to bed with CGA for balance with decreased balance and safety awareness noted. Once seated EOB pt reaches for shoes and attempts to don independently. Due to tight fit and wearing of hospital socks pt requires assist to don shoes. With encouragement pt agreeable to transfer back to w/c though he does exhibit some slight agitation when encouraged by therapist to transfer back to chair. Pt then taken to sink in w/c, declines to perform oral hygiene or wash his face once seated in w/c at sink. Pt does report urge to use the bathroom. Ambulatory transfer to toilet with RW and CGA. Pt is able  to perform clothing management with min A and continently void urine while seated on toilet. Pt returned to w/c and is able to wash his hands while seated in w/c at sink. Ambulation 2 x 200 ft with RW and CGA in hallway with cues for safe turning to sit down in w/c as pt tries to sit prior to fully turning to back up to w/c. Pt agreeable to return to bed at end of session. Sit to supine Supervision. Pt left supine in bed with needs in reach, Posey belt in place, bed alarm in place, telesitter present.  Therapy Documentation Precautions:  Precautions Precautions: Fall, Other (comment) Precaution Comments: SBP<200, aphasia, can be combative Restrictions Weight Bearing Restrictions: No       Therapy/Group: Individual Therapy   Peter Congo, PT, DPT, CSRS  07/16/2021, 10:43 AM

## 2021-07-16 NOTE — Progress Notes (Signed)
Patient became confused and agitated with therapy, continued to attempt to get out of bed and yell at staff. Patient refused PO meds. MD notified and ativan ordered. Staff assisted patient to bed and ativan given at 1514. Patient currently resting with eyes closed.

## 2021-07-16 NOTE — Progress Notes (Signed)
Occupational Therapy Session Note  Patient Details  Name: Devon Elliott. MRN: [id number] Date of Birth: 23-Apr-1927  Today's Date: 07/17/2021 OT Individual Time: 1105-1200 and [id number] OT Individual Time Calculation (min): 55 min and 39 min   Skilled Therapeutic Interventions/Progress Updates:    Pt greeted in bed with no s/s pain, asleep, woken and more alert after increasing environmental stimuli (turning on lights, playing music from the 1930s). Pt intermittently falling asleep during session and per nursing, this is most likely medicine related. Pt only stated "ok" and "no" throughout session. Unable to identify family members or himself in family photos. His family brought in a Christmas stocking with some gifts, hand over hand assistance needed to apply chapstick as pt just twirled the small tube in his hand. Affect appeared bright when he stated "ok" after OT asked if he wanted to shave. Attempted to get him up and sit at the sink for shaving, however pt would move his LEs back in bed when OT gently facilitated movement towards EOB. Therefore shaving completed bedlevel. Tried to gently facilitate pt opening shaving cream or applying cream to face with pt resistant to hand over hand at this time and stating "no!" Unable to consistently following 1 step instruction regarding head turning/tilting to assist OT with shaving. Pt also intermittently fell asleep and needed cues for orientation whenever he wakened. He stated "no" and "rough" and OT retrieved new razors. Pt did assist with washing his face with warm wash cloth after given setup and cues. Pt remained in bed at close of session, all needs within reach, waist belt secured, and bed alarm set. Tx focus placed on activity tolerance, functional cognition, and ADL retraining.   2nd Session 1:1 tx (39 min) Pt greeted in bed, just finished urinary cath with nursing staff. Pt appearing alert. Attempted supine<sit before taking medicine from RN  however pt would keep placing his blankets over himself once uncovered. Increased time provided for encouragement however ultimately unsuccessful. +2 for boosting him up in bed in order to take medicine. OT retrieved a 1# bar and attempted hand over hand to have pt participate in some exercise. Also provided him with 2 heated blankets at this time to increase participation. This also was unsuccessful. Pt performed oral care with Min A and vcs with HOB elevated. Pt disoriented to time, place, and situation but pleasantly confused during tx. He remained in bed at close of session, waist belt fastened, call bell within reach, and bed alarm set. No s/s pain during session.   Therapy Documentation Precautions:  Precautions Precautions: Fall, Other (comment) Precaution Comments: mild global aphasia, can be combative Restrictions Weight Bearing Restrictions: No ADL: ADL Grooming: Minimal assistance Upper Body Bathing: Moderate assistance Where Assessed-Upper Body Bathing: Edge of bed Lower Body Bathing: Moderate assistance Where Assessed-Lower Body Bathing: Edge of bed Upper Body Dressing: Maximal assistance Where Assessed-Upper Body Dressing: Edge of bed Lower Body Dressing: Maximal assistance Where Assessed-Lower Body Dressing: Edge of bed Toileting: Unable to assess Toilet Transfer: Moderate assistance (+2 for safety) Toilet Transfer Method: Stand pivot Toilet Transfer Equipment: Bedside commode  Therapy/Group: Individual Therapy  Simmie Garin A Braedyn Kauk 07/17/2021, 12:33 PM

## 2021-07-16 NOTE — Progress Notes (Signed)
Occupational Therapy Session Note  Patient Details  Name: Devon Elliott. MRN: [id number] Date of Birth: [birth year]  Today's Date: 07/16/2021 OT Individual Time: 1696-7893 OT Individual Time Calculation (min): 53 min    Short Term Goals: Week 3:  OT Short Term Goal 1 (Week 3): STG = LTG 2/2 ELOS  Skilled Therapeutic Interventions/Progress Updates:  Pt greeted supine in bed reporting "I want to go to bed" and restless but declines therapy however noted pt trying to get OOB. Assisted pt OOB with MIN A in an effort to redirect and provide change of scenery. Pt sit<>stand from EOB with MIN A, pt would not allow this OTA to fix his shoes and was resistant to donning gait belt. Pt fixated on wrapping his shoulders with his blanket and placing his stuffed monkey in his pants pocket however these particular pants did not have pockets so pt trying to put stuffed animal down his pants. Pt ambulated ~ 30 ft with rw and MIN A +2 for safety with chair follow d/t increasing agitation. Pt began walking into next unit and became increasingly agitated and resistant to redirection. Eventually able to get pt to sit in w/c and return pt back to room. Called his wife in attempt to calm pt down with little success. Tried strategies such as redirection,relaxation, adjusting the environment with pt resistant to all interventions. Pt needed total A to return to supine d/t resistance. Pt left supine in bed with posey alarm belt activated, bed alarm activated, telesitter on and RN and NT present.                  Therapy Documentation Precautions:  Precautions Precautions: Fall, Other (comment) Precaution Comments: SBP<200, aphasia, can be combative Restrictions Weight Bearing Restrictions: No  Pain: no s/s of pain during session      Therapy/Group: Individual Therapy  Barron Schmid 07/16/2021, 3:40 PM

## 2021-07-16 NOTE — Progress Notes (Signed)
PROGRESS NOTE   Subjective/Complaints: No new complaints this morning Appears much more alert than yesterday Still not voiding- I/O cath- 250 cc on last cath Keflex started last night for UTI and has been afebrile overnight  ROS: Limited due to language    Objective:   DG Chest 2 View  Result Date: 07/15/2021 CLINICAL DATA:  Fever. EXAM: CHEST - 2 VIEW COMPARISON:  July 05, 2019 FINDINGS: Low lung volumes are seen with mild, diffusely increased interstitial lung markings. Mild to moderate severity areas of atelectasis and/or infiltrate are seen within the bilateral lung bases. There is no evidence of a pleural effusion or pneumothorax. The cardiac silhouette is mildly enlarged. Evidence of prior vertebroplasty is noted within the upper lumbar spine. IMPRESSION: Low lung volumes with mild to moderate severity bibasilar atelectasis and/or infiltrate. Electronically Signed   By: Virgina Norfolk M.D.   On: 07/15/2021 21:30   Recent Labs    07/15/21 1635  WBC 6.9  HGB 9.7*  HCT 30.1*  PLT 185    No results for input(s): NA, K, CL, CO2, GLUCOSE, BUN, CREATININE, CALCIUM in the last 72 hours.   Intake/Output Summary (Last 24 hours) at 07/16/2021 1045 Last data filed at 07/16/2021 0900 Gross per 24 hour  Intake 1391.36 ml  Output 550 ml  Net 841.36 ml        Physical Exam: Vital Signs Blood pressure (!) 137/91, pulse 78, temperature 98.3 F (36.8 C), resp. rate 18, weight 67.1 kg, SpO2 95 %. Gen: no distress, normal appearing HEENT: oral mucosa pink and moist, NCAT Cardio: Reg rate Chest: normal effort, normal rate of breathing Abd: soft, non-distended Ext: no edema Psych: impaired understanding, though pleasant, impulsive Skin: No evidence of breakdown, no evidence of rash , erythema RIght 4th digit Neurologic: a little lethargic.aphasic, follows 25% or less of simple one step commands. Cranial nerves II through  XII intact, motor strength is 5/5 in left and 3- RIght deltoid, bicep, tricep, grip, 5/5 bilateral hip flexor, knee extensors, ankle dorsiflexor and plantar flexor Musculoskeletal: right hand pain    Assessment/Plan: 1. Functional deficits which require 3+ hours per day of interdisciplinary therapy in a comprehensive inpatient rehab setting. Physiatrist is providing close team supervision and 24 hour management of active medical problems listed below. Physiatrist and rehab team continue to assess barriers to discharge/monitor patient progress toward functional and medical goals  Care Tool:  Bathing    Body parts bathed by patient: Chest, Face, Right upper leg, Left upper leg, Right lower leg, Left lower leg   Body parts bathed by helper: Right arm, Left arm, Front perineal area, Buttocks, Left lower leg, Right lower leg     Bathing assist Assist Level: Contact Guard/Touching assist     Upper Body Dressing/Undressing Upper body dressing   What is the patient wearing?: Pull over shirt    Upper body assist Assist Level: Minimal Assistance - Patient > 75%    Lower Body Dressing/Undressing Lower body dressing      What is the patient wearing?: Pants     Lower body assist Assist for lower body dressing: Minimal Assistance - Patient > 75%     Toileting  Toileting    Toileting assist Assist for toileting: 2 Helpers     Transfers Chair/bed transfer  Transfers assist     Chair/bed transfer assist level: Contact Guard/Touching assist Chair/bed transfer assistive device: Programmer, multimedia   Ambulation assist      Assist level: Contact Guard/Touching assist Assistive device: Walker-rolling Max distance: 200'   Walk 10 feet activity   Assist     Assist level: Contact Guard/Touching assist Assistive device: Walker-rolling   Walk 50 feet activity   Assist Walk 50 feet with 2 turns activity did not occur: Safety/medical concerns  Assist level:  Contact Guard/Touching assist Assistive device: Walker-rolling    Walk 150 feet activity   Assist Walk 150 feet activity did not occur: Safety/medical concerns  Assist level: Contact Guard/Touching assist Assistive device: Walker-rolling    Walk 10 feet on uneven surface  activity   Assist Walk 10 feet on uneven surfaces activity did not occur: Safety/medical concerns         Wheelchair     Assist Is the patient using a wheelchair?: Yes Type of Wheelchair: Manual    Wheelchair assist level: Maximal Assistance - Patient 25 - 49% Max wheelchair distance: 150    Wheelchair 50 feet with 2 turns activity    Assist        Assist Level: Maximal Assistance - Patient 25 - 49%   Wheelchair 150 feet activity     Assist      Assist Level: Maximal Assistance - Patient 25 - 49%   Blood pressure (!) 137/91, pulse 78, temperature 98.3 F (36.8 C), resp. rate 18, weight 67.1 kg, SpO2 95 %.  Medical Problem List and Plan: 1. Functional deficits secondary to left MCA infarct likely secondary to embolism Righ themiparesis and moderate global aphasia , suspect apraxia as well              -patient may  shower             -ELOS/Goals: 12-14d    -Continue CIR therapies including PT, OT, and SLP   2.  Antithrombotics: -DVT/anticoagulation:  Pharmaceutical: Other (comment)  apixaban 5 mg BID             -antiplatelet therapy: none 3. Pain: continue Tylenol Added diclofenac gel to RIght hand - hand pain as per ortho suspect gout but septic arthritis in DDx await MRI RIght hand  12/23- got MRI no septic arthritis- -will continue regimen 4. Agitation: Telesitter for safety, Depakote,              -antipsychotic agents: continue Seroquel 25 mg daily and q HS, Seroquel 25 mg q 6 hours PRN agitation;  Namenda 5. Neuropsych: This patient is not capable of making decisions on his own behalf. 6. Skin/Wound Care: routine skin checks 7. Fluids/Electrolytes/Nutrition: routine  ins and outs with follow-up chemistries. Hypokalemia resolved. 8. Atrial fibrillation: rate controlled. Increase lopressor to 66m BID as in #9, Eliquis 5 mg daily 9. Hypertension: goal 1324-401systolic BP post thrombectomy. Continue metoprolol. Increase amlodipine to 527m Increase flomax to 0.73m51mPlaced paramaters for lopressor to please hold if SBP <100 or DBP<60 or HR <60 Vitals:   07/16/21 0351 07/16/21 0651  BP: (!) 183/105 (!) 137/91  Pulse: 94 78  Resp: 18   Temp: 98.3 F (36.8 C)   SpO2: 95%   Continue Lopressor to 49m20mD   10: Hyperlipidemia: LDL =  110; goal <70. Started on high intensity statin: atorvastatin 40  mg. Continue at discharge. 11: Delirium with history of mild dementia. Continue Depakote, Seroquel and Namenda Reduced comprehension associated with receptive language deficits are contributing  12: HFrEF: currently euvolemic 13: AKI: resolved. BUN/Cr trending back up   BMP Latest Ref Rng & Units 07/09/2021 07/08/2021 07/07/2021  Glucose 70 - 99 mg/dL 106(H) 118(H) 119(H)  BUN 8 - 23 mg/dL 20 28(H) 32(H)  Creatinine 0.61 - 1.24 mg/dL 1.09 1.09 1.31(H)  Sodium 135 - 145 mmol/L 141 145 144  Potassium 3.5 - 5.1 mmol/L 3.8 3.9 3.7  Chloride 98 - 111 mmol/L 114(H) 114(H) 110  CO2 22 - 32 mmol/L 20(L) 22 25  Calcium 8.9 - 10.3 mg/dL 7.8(L) 7.9(L) 8.3(L)  Some fluctuation in range of 1.0-1.4, cont to monitor , no nephrotoxic meds identified , negative fluid balance with poor intake , this is likely contributing   12/23- IVFs for elevated Bun of 32- ~ 8-10 points above baseline since here-    12/24 BUN/Cr better but still 28/1.1 today---resume HS fluids 1/2ns at 50cc/hr  12/26- BUN 20 and Cr 1.09- con't HS at night. Repeat Creatinine on Monday. 14: Urinary retention/bloody clots: Foley cath placed 12/9. Will discontinue in AM. Orders in place for bladder scanning, PVR check and in/out cath if necessary  -blood clots in urine with caths d/t eliquis  -Increase flomax to  0.82m, bp can tolerate  12/22- still requiring in/out caths- bloody/clots- will check with Urology- if they think we need to place a 3 way foley?  12/26- pt wasn't voiding at all- prior to placing catheter- wife want sus to keep foley- not do in/out caths.  15. Leukocytosis, has been bouncing from 7K to 12 K over the last 2 weeks  12.9 on 12/19  -afebrile  12/23- WBC down to 8.7  -  E coli >100K sens to Keflex     16.RIght fourth digit swelling, x-rays show erosive changes likely chronic however given some elevation of white cells as well as elevated ESR and CRP would pursue MRI of the finger/hand right side, appreciate Ortho assistance  12/23- MRI (-) except mild subQ tissue edema 17. Impulsitivity/s/p fall: Posey bed and telesitter.  18. UTI: foley removed, keflex started, I/O caths ordered, fever resolved and appears more alert. WBC stable. UC in process   LOS: 17 days A FACE TO FACE EVALUATION WAS PERFORMED  Jamon Hayhurst P Devere Brem 07/16/2021, 10:45 AM

## 2021-07-17 DIAGNOSIS — R410 Disorientation, unspecified: Secondary | ICD-10-CM

## 2021-07-17 LAB — CBC
HCT: 29.4 % — ABNORMAL LOW (ref 39.0–52.0)
Hemoglobin: 9.7 g/dL — ABNORMAL LOW (ref 13.0–17.0)
MCH: 29.8 pg (ref 26.0–34.0)
MCHC: 33 g/dL (ref 30.0–36.0)
MCV: 90.2 fL (ref 80.0–100.0)
Platelets: 150 10*3/uL (ref 150–400)
RBC: 3.26 MIL/uL — ABNORMAL LOW (ref 4.22–5.81)
RDW: 14.8 % (ref 11.5–15.5)
WBC: 8.7 10*3/uL (ref 4.0–10.5)
nRBC: 0 % (ref 0.0–0.2)

## 2021-07-17 LAB — BASIC METABOLIC PANEL
Anion gap: 10 (ref 5–15)
BUN: 17 mg/dL (ref 8–23)
CO2: 22 mmol/L (ref 22–32)
Calcium: 7.8 mg/dL — ABNORMAL LOW (ref 8.9–10.3)
Chloride: 109 mmol/L (ref 98–111)
Creatinine, Ser: 1.12 mg/dL (ref 0.61–1.24)
GFR, Estimated: >60 mL/min (ref >60)
Glucose, Bld: 97 mg/dL (ref 70–99)
Potassium: 3.4 mmol/L — ABNORMAL LOW (ref 3.5–5.1)
Sodium: 141 mmol/L (ref 135–145)

## 2021-07-17 MED ORDER — POTASSIUM CHLORIDE CRYS ER 20 MEQ PO TBCR
20.0000 meq | EXTENDED_RELEASE_TABLET | Freq: Every day | ORAL | Status: AC
  Administered 2021-07-17 – 2021-07-18 (×2): 20 meq via ORAL
  Filled 2021-07-17 (×2): qty 1

## 2021-07-17 NOTE — Progress Notes (Signed)
Inpatient Rehabilitation Discharge Medication Review by a Pharmacist  A complete drug regimen review was completed for this patient to identify any potential clinically significant medication issues.  High Risk Drug Classes Is patient taking? Indication by Medication  Antipsychotic Yes Seroquel, Depakote for agitation  Anticoagulant Yes Eliquis for Afib  Antibiotic Yes Nitrofurantoin to complete treatment for UTI  Opioid No   Antiplatelet No   Hypoglycemics/insulin No   Vasoactive Medication Yes Amlodipine, metoprolol for Afib, BP  Chemotherapy No   Other Yes Namenda for dementia Bethanechol, Flomax for urinary retention Protonix for GERD Atorvastatin for HLD     Type of Medication Issue Identified Description of Issue Recommendation(s)  Drug Interaction(s) (clinically significant)     Duplicate Therapy     Allergy     No Medication Administration End Date     Incorrect Dose     Additional Drug Therapy Needed     Significant med changes from prior encounter (inform family/care partners about these prior to discharge).    Other       Clinically significant medication issues were identified that warrant physician communication and completion of prescribed/recommended actions by midnight of the next day:  No  Pharmacist comments: None  Time spent performing this drug regimen review (minutes): 20 minutes   Tad Moore 07/17/2021 9:45 AM

## 2021-07-17 NOTE — Progress Notes (Signed)
PROGRESS NOTE   Subjective/Complaints:  Remains aphasic   ROS: Limited due to language    Objective:   DG Chest 2 View  Result Date: 07/15/2021 CLINICAL DATA:  Fever. EXAM: CHEST - 2 VIEW COMPARISON:  July 05, 2019 FINDINGS: Low lung volumes are seen with mild, diffusely increased interstitial lung markings. Mild to moderate severity areas of atelectasis and/or infiltrate are seen within the bilateral lung bases. There is no evidence of a pleural effusion or pneumothorax. The cardiac silhouette is mildly enlarged. Evidence of prior vertebroplasty is noted within the upper lumbar spine. IMPRESSION: Low lung volumes with mild to moderate severity bibasilar atelectasis and/or infiltrate. Electronically Signed   By: Virgina Norfolk M.D.   On: 07/15/2021 21:30   Recent Labs    07/15/21 1635 07/17/21 0619  WBC 6.9 8.7  HGB 9.7* 9.7*  HCT 30.1* 29.4*  PLT 185 150     Recent Labs    07/17/21 0619  NA 141  K 3.4*  CL 109  CO2 22  GLUCOSE 97  BUN 17  CREATININE 1.12  CALCIUM 7.8*     Intake/Output Summary (Last 24 hours) at 07/17/2021 1249 Last data filed at 07/17/2021 0900 Gross per 24 hour  Intake 1284.6 ml  Output 1475 ml  Net -190.4 ml         Physical Exam: Vital Signs Blood pressure (!) 152/88, pulse 78, temperature 97.8 F (36.6 C), resp. rate 20, weight 67.1 kg, SpO2 100 %.  General: No acute distress Mood and affect are appropriate Heart: Regular rate and rhythm no rubs murmurs or extra sounds Lungs: Clear to auscultation, breathing unlabored, no rales or wheezes Abdomen: Positive bowel sounds, soft nontender to palpation, nondistended Extremities: No clubbing, cyanosis, or edema Skin: No evidence of breakdown, no evidence of rash   Psych: impaired understanding, though pleasant, impulsive Skin: No evidence of breakdown, no evidence of rash , erythema RIght 4th digit Neurologic: a little  lethargic.aphasic, follows 25% or less of simple one step commands. Cranial nerves II through XII intact, motor strength is 5/5 in left and 3- RIght deltoid, bicep, tricep, grip, 5/5 bilateral hip flexor, knee extensors, ankle dorsiflexor and plantar flexor Musculoskeletal: right hand pain    Assessment/Plan: 1. Functional deficits which require 3+ hours per day of interdisciplinary therapy in a comprehensive inpatient rehab setting. Physiatrist is providing close team supervision and 24 hour management of active medical problems listed below. Physiatrist and rehab team continue to assess barriers to discharge/monitor patient progress toward functional and medical goals  Care Tool:  Bathing    Body parts bathed by patient: Chest, Face, Right upper leg, Left upper leg, Right lower leg, Left lower leg   Body parts bathed by helper: Right arm, Left arm, Front perineal area, Buttocks, Left lower leg, Right lower leg     Bathing assist Assist Level: Contact Guard/Touching assist     Upper Body Dressing/Undressing Upper body dressing   What is the patient wearing?: Pull over shirt    Upper body assist Assist Level: Minimal Assistance - Patient > 75%    Lower Body Dressing/Undressing Lower body dressing      What is the patient  wearing?: Pants     Lower body assist Assist for lower body dressing: Minimal Assistance - Patient > 75%     Toileting Toileting    Toileting assist Assist for toileting: 2 Helpers     Transfers Chair/bed transfer  Transfers assist     Chair/bed transfer assist level: Contact Guard/Touching assist Chair/bed transfer assistive device: Programmer, multimedia   Ambulation assist      Assist level: Contact Guard/Touching assist Assistive device: Walker-rolling Max distance: 200'   Walk 10 feet activity   Assist     Assist level: Contact Guard/Touching assist Assistive device: Walker-rolling   Walk 50 feet  activity   Assist Walk 50 feet with 2 turns activity did not occur: Safety/medical concerns  Assist level: Contact Guard/Touching assist Assistive device: Walker-rolling    Walk 150 feet activity   Assist Walk 150 feet activity did not occur: Safety/medical concerns  Assist level: Contact Guard/Touching assist Assistive device: Walker-rolling    Walk 10 feet on uneven surface  activity   Assist Walk 10 feet on uneven surfaces activity did not occur: Safety/medical concerns         Wheelchair     Assist Is the patient using a wheelchair?: Yes Type of Wheelchair: Manual    Wheelchair assist level: Maximal Assistance - Patient 25 - 49% Max wheelchair distance: 150    Wheelchair 50 feet with 2 turns activity    Assist        Assist Level: Maximal Assistance - Patient 25 - 49%   Wheelchair 150 feet activity     Assist      Assist Level: Maximal Assistance - Patient 25 - 49%   Blood pressure (!) 152/88, pulse 78, temperature 97.8 F (36.6 C), resp. rate 20, weight 67.1 kg, SpO2 100 %.  Medical Problem List and Plan: 1. Functional deficits secondary to left MCA infarct likely secondary to embolism Righ themiparesis and moderate global aphasia , suspect apraxia as well              -patient may  shower             -ELOS/Goals: Tent d/c in am   -Continue CIR therapies including PT, OT, and SLP   2.  Antithrombotics: -DVT/anticoagulation:  Pharmaceutical: Other (comment)  apixaban 5 mg BID             -antiplatelet therapy: none 3. Pain: continue Tylenol Added diclofenac gel to RIght hand - hand pain as per ortho suspect gout but septic arthritis in DDx await MRI RIght hand  12/23- got MRI no septic arthritis- -will continue regimen 4. Agitation: Telesitter for safety, Depakote,              -antipsychotic agents: continue Seroquel 25 mg daily and q HS, Seroquel 25 mg q 6 hours PRN agitation;  Namenda 5. Neuropsych: This patient is not capable of  making decisions on his own behalf. 6. Skin/Wound Care: routine skin checks 7. Fluids/Electrolytes/Nutrition: routine ins and outs with follow-up chemistries. Hypokalemia resolved. 8. Atrial fibrillation: rate controlled. Increase lopressor to 95m BID as in #9, Eliquis 5 mg daily 9. Hypertension: goal 1917-915systolic BP post thrombectomy. Continue metoprolol. Increase amlodipine to 535m Increase flomax to 0.41m6mPlaced paramaters for lopressor to please hold if SBP <100 or DBP<60 or HR <60 Vitals:   07/17/21 0311 07/17/21 0312  BP: (!) 152/88   Pulse: 78   Resp: 20   Temp:  97.8 F (36.6 C)  SpO2: 100%   Continue Lopressor to 43m BID   10: Hyperlipidemia: LDL =  110; goal <70. Started on high intensity statin: atorvastatin 40 mg. Continue at discharge. 11: Delirium with history of mild dementia. Continue Depakote, Seroquel and Namenda Reduced comprehension associated with receptive language deficits are contributing  12: HFrEF: currently euvolemic 13: AKI: resolved.    BMP Latest Ref Rng & Units 07/17/2021 07/09/2021 07/08/2021  Glucose 70 - 99 mg/dL 97 106(H) 118(H)  BUN 8 - 23 mg/dL 17 20 28(H)  Creatinine 0.61 - 1.24 mg/dL 1.12 1.09 1.09  Sodium 135 - 145 mmol/L 141 141 145  Potassium 3.5 - 5.1 mmol/L 3.4(L) 3.8 3.9  Chloride 98 - 111 mmol/L 109 114(H) 114(H)  CO2 22 - 32 mmol/L 22 20(L) 22  Calcium 8.9 - 10.3 mg/dL 7.8(L) 7.8(L) 7.9(L)   14: Urinary retention/bloody clots:  -Increase flomax to 0.874m bp can tolerate Will replace foley if not voiding prior to discharge  15. Leukocytosis,Resolved     16.RIght fourth digit swelling, x-rays show erosive changes likely chronic however given some elevation of white cells as well as elevated ESR and CRP would pursue MRI of the finger/hand right side, appreciate Ortho assistance  12/23- MRI (-) except OA plus mild subQ tissue edema 17. Impulsitivity/s/p fall: Posey bed and telesitter.  18. UTI: e coli await sensitivity - on  macrobid   LOS: 18 days A FACE TO FACE EVALUATION WAS PERFORMED  AnCharlett Blake/08/2021, 12:49 PM

## 2021-07-17 NOTE — Progress Notes (Signed)
Occupational Therapy Session Note  Patient Details  Name: Devon Elliott. MRN: [id number] Date of Birth: 1927-01-03  Today's Date: 07/17/2021 OT Individual Time: 9242-6834 OT Individual Time Calculation (min): 26 min    Short Term Goals: Week 3:  OT Short Term Goal 1 (Week 3): STG = LTG 2/2 ELOS  Skilled Therapeutic Interventions/Progress Updates:    Pt in bed laying on his right side to start.  No oriented to place or city when asked.  When given choices, he was also not able to pick and verbally, did not respond to questions asked.  Noted pt with soiled brief in bed and paper scrub pants pulled down.  Therapist spent a large amount of time trying to get the pt to understand that he needed to be cleaned up and his brief changed.  Initially, pt was resistant as he did not demonstrate emergent awareness of the situation.  Therapist provided total assist with pt laying in right sidelying for removal of soiled brief, washing his buttocks, and then donning a new one.  He was able to initiate and complete rolling to the left side when instructed to do so with min guard assist.  Once therapist donned clean brief and provided max assist for pulling up pants in supine with pt bridging and rolling, he demonstrated the desire to sit up.  Min assist provided for sitting up on the side of the bed.  He then attempted to stand with min assist, but was unable to voice why he was standing.  No walker in front of him, just therapist guarding him to keep him safe.  He needed mod demonstrational cueing and min assist to take steps up toward the top of the bed in preparation for laying down again.  Once therapist had him sit back down, he needed min demonstrational cueing to initiate and complete transition to supine.  He was left talking on the phone with his spouse and the call button and phone in reach and safety alarm in place.    Therapy Documentation Precautions:  Precautions Precautions: Fall, Other  (comment) Precaution Comments: mild global aphasia, can be combative Restrictions Weight Bearing Restrictions: No  Pain: Pain Assessment Pain Scale: Faces Pain Score: 0-No pain ADL: See Care Tool Section for some details of mobility and selfcare    Therapy/Group: Individual Therapy  Quince Santana OTR/L 07/17/2021, 4:21 PM

## 2021-07-17 NOTE — Progress Notes (Signed)
Patient ID: Devon Elliott., male   DOB: 1927/03/14, 88 y.o.   MRN: [id number] Spoke with Devon Elliott-wife via telephone to update her regarding pt's care needs and discharge date tomorrow. Christina-SW spoke with harriet-daughter in-law regarding updates. Devon Elliott is a retired [employer] fund[employer] but needs assist for her mobility and has her own caregiver at home. [facility] who provides the private duty assist and beside RN has spoken with them regarding pt's care needs. Answered her questions and gave her Christina's desk number in case other questions came up.

## 2021-07-17 NOTE — Consult Note (Signed)
Neuropsychological Consultation   Patient:   Devon Elliott.   DOB:   16-Aug-1926  MR Number:  [id number]  Location:  [facility] [place] [id number] [place] [address] Dept: [place]: [phone]           Date of Service:   07/17/2020  Start Time:   10 AM End Time:   11 AM  Provider/Observer:  Ilean Skill, Psy.D.       Clinical Neuropsychologist       Billing Code/Service: [id number]  Chief Complaint:    Devon Elliott. is a 88 year old male who began experiencing mild word finding difficulties on the evening of 06/18/2021.  As the patient's symptoms began to worsen that day family called EMS.  At the time of EMS arrival the patient was combative with EMS personnel.  Patient was found to have dense global aphasia and was unable to follow any commands.  He remained alert and protecting airway.  Patient was having antigravity in his left leg with some movement against gravity.  Neuro imaging findings were consistent with acute left MCA M2 occlusion.  Patient was transferred to [facility] and interventional revascularization was attempted.  Patient had postprocedure agitation and restlessness.  MRI on 12/10 revealed left MCA infarct in the posterior left frontoparietal region.  Reason for Service:  Patient was referred for neuropsychological consultation as he is continued to have agitation and confusion at times on the unit.  Below is the HPI for the current admission.  HPI: Devon Elliott. Lundstrom is a 88 year old male who began experiencing mild word finding difficulties on the evening of June 18, 2021.  The following morning, his symptoms worsened and family called EMS.  The patient, at that time was combative, with EMS personnel.  He arrived at [facility] emergency department, assessed and neurology consulted.  Code stroke was activated by the emergency department physician.  He was found to have dense global aphasia and was unable to follow any commands.  He remained alert and protecting his airway. He was exhibiting expressive aphasia as well.  He had antigravity in his right leg with some movement against gravity.  CTH NAICP w/ ASPECTS 10.    CT of the head, CTA of head and CT perfusion were completed.  The patient was not a candidate for IV thrombolysis due to LKW of greater than 4.5 hours.  Imaging findings were consistent with acute left MCA M2 occlusion.  Dr. Quinn Axe discussed the findings with the patient and with Dr. Earleen Newport at [facility] neuro interventional radiology.  The patient and his family agreed to transfer to [facility].  Patient and family were counseled regarding interventional revascularization.  Dr. Earleen Newport felt given his profile, he would be a reasonable candidate for attempted mechanical thrombectomy.  This was performed via right common femoral artery access by Dr. Earleen Newport on June 19, 2021 at 3 PM.  He was extubated and admitted to the ICU.  He exhibited postprocedure agitation and restlessness.  He was medicated and noted to have transient hypotension and mild hypoxia.  Cleviprex was tapered off.  Critical care management was consulted; neurology following. MCA infarct felt likely secondary to atrial fibrillation. His hyperactive delirium continued and he was placed on low-dose Precedex infusion.  Palliative care was consulted to establish goals of care.  Remained full code with full scope of care at that time. Lovenox for DVT prophylaxis.  Heparin infusion  started on June 21, 2021. EEG performed: suggestive of cortical dysfunction in right hemisphere as well as mild diffuse encephalopathy, nonspecific etiology.  No seizures or epileptiform discharges were seen throughout the recording. Heparin infusion discontinued and transitioned to Lovenox 65 mg q 24 hours due to difficulty obtaining labs. Follow-up CT head without acute change. Extracranial carotid  arteries <50% stenosis. Able to cooperate with MRI of head June 24, 2021. 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 [name]. Patient is now DNR. The patient requires inpatient medicine and rehabilitation evaluations and services for ongoing dysfunction secondary to left MCA infarct.  Current Status:  I entered the room and the patient was sound asleep and was hard to awaken.  He did wake up with verbal commands but remained quite drowsy throughout and never appeared to become fully oriented.  The patient was unable to even identify time variables such as year or month or situation regarding location and reason for him being in the hospital.  The patient was pleasant with his interactions but had difficulty maintaining focus and arousal.  Patient had recently had his benzo diazepam and presumably as he had remained agitated at various times in the past 24 hours.  I was unable to get much information at all from him due to his somnolence.  Behavioral Observation: Devon Elliott.  presents as a 88 y.o.-year-old Right Caucasian Male who appeared his stated age. his dress was Appropriate and he was Well Groomed and his manners were Appropriate, inappropriate to the situation.  his participation was indicative of Drowsy and Inattentive behaviors.  There were physical disabilities noted.  he displayed an inappropriate level of cooperation and motivation.     Interactions:    Minimal Drowsy  Attention:   abnormal and attention span appeared shorter than expected for age  Memory:   abnormal; global memory impairment noted  Visuo-spatial:  not examined  Speech (Volume):  low  Speech:   slurred;   Thought Process:  Circumstantial  Though Content:  WNL; not suicidal and not  homicidal  Orientation:   person  Judgment:   Poor  Planning:   Poor  Affect:    Lethargic  Mood:    Dysphoric  Insight:   Shallow  Intelligence:   normal  Medical History:   Past Medical History:  Diagnosis Date   Complete tear of left rotator cuff 07/21/2014   Essential hypertension 04/19/2014   Impingement syndrome of left shoulder 07/21/2014   Left hip pain 02/08/2016   Left supraspinatus tenosynovitis 07/21/2014   Pure hypercholesterolemia 04/19/2014         Patient Active Problem List   Diagnosis Date Noted   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   Abdominal pain, acute, epigastric 06/27/2021   Delirium 06/27/2021   Acute hypoxemic respiratory failure ([place]) 06/27/2021   HFrEF (heart failure with reduced ejection fraction) ([place]) 06/27/2021   Hyperlipidemia 06/27/2021   Goals of care, counseling/discussion 06/27/2021   Acute ischemic left MCA stroke ([place]) 06/19/2021   Cerebrovascular accident (CVA) ([place])    Esophageal ulcer 05/01/2020   Hypokalemia    AF (paroxysmal atrial fibrillation) ([place])    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   AKI (acute kidney injury) ([place])    Hypernatremia    Cellulitis of left leg    Gastroesophageal reflux disease without esophagitis    Oral phase dysphagia 05/22/2016   Esophageal dysphagia 05/22/2016   Left hip pain 02/08/2016   Complete tear of left rotator cuff 07/21/2014  Impingement syndrome of left shoulder 07/21/2014   Left supraspinatus tenosynovitis 07/21/2014   Essential hypertension 04/19/2014   Pure hypercholesterolemia 04/19/2014    Psychiatric History:  No prior psychiatric history  Family Med/Psych History:  Family History  Problem Relation Age of Onset   Pancreatic cancer Mother    Diabetes Father     Risk of Suicide/Violence: low patient has, however, been very agitated at times but he is not a physical threat to others and there is no indication he has any intention in harming himself or others.  Impression/DX:  Abram Plesha. is a 88 year old male who began experiencing mild word finding difficulties on the evening of 06/18/2021.  As the patient's symptoms began to worsen that day family called EMS.  At the time of EMS arrival the patient was combative with EMS personnel.  Patient was found to have dense global aphasia and was unable to follow any commands.  He remained alert and protecting airway.  Patient was having antigravity in his left leg with some movement against gravity.  Neuro imaging findings were consistent with acute left MCA M2 occlusion.  Patient was transferred to [facility] and interventional revascularization was attempted.  Patient had postprocedure agitation and restlessness.  MRI on 12/10 revealed left MCA infarct in the posterior left frontoparietal region.  I entered the room and the patient was sound asleep and was hard to awaken.  He did wake up with verbal commands but remained quite drowsy throughout and never appeared to become fully oriented.  The patient was unable to even identify time variables such as year or month or situation regarding location and reason for him being in the hospital.  The patient was pleasant with his interactions but had difficulty maintaining focus and arousal.  Patient had recently had his benzo diazepam and presumably as he had remained agitated at various times in the past 24 hours.  I was unable to get much information at all from him due to his somnolence   Diagnosis:    Left MCA M2 occlusion/infarct         Electronically Signed   _______________________ Ilean Skill, Psy.D. Clinical Neuropsychologist

## 2021-07-17 NOTE — Plan of Care (Signed)
Problem: RH Wheelchair Mobility Goal: LTG Patient will propel w/c in controlled environment (PT) Description: LTG: Patient will propel wheelchair in controlled environment, # of feet with assist (PT) Outcome: Not Applicable Goal: LTG Patient will propel w/c in home environment (PT) Description: LTG: Patient will propel wheelchair in home environment, # of feet with assistance (PT). Outcome: Not Applicable   Problem: RH Balance Goal: LTG Patient will maintain dynamic sitting balance (PT) Description: LTG:  Patient will maintain dynamic sitting balance with assistance during mobility activities (PT) Outcome: Completed/Met Flowsheets (Taken 07/17/2021 0821) LTG: Pt will maintain dynamic sitting balance during mobility activities with:: Supervision/Verbal cueing Goal: LTG Patient will maintain dynamic standing balance (PT) Description: LTG:  Patient will maintain dynamic standing balance with assistance during mobility activities (PT) Outcome: Completed/Met Flowsheets (Taken 07/17/2021 0821) LTG: Pt will maintain dynamic standing balance during mobility activities with:: Contact Guard/Touching assist   Problem: Sit to Stand Goal: LTG:  Patient will perform sit to stand with assistance level (PT) Description: LTG:  Patient will perform sit to stand with assistance level (PT) Outcome: Completed/Met Flowsheets (Taken 07/17/2021 0821) LTG: PT will perform sit to stand in preparation for functional mobility with assistance level:  Supervision/Verbal cueing  Contact Guard/Touching assist   Problem: RH Bed Mobility Goal: LTG Patient will perform bed mobility with assist (PT) Description: LTG: Patient will perform bed mobility with assistance, with/without cues (PT). Outcome: Completed/Met Flowsheets (Taken 07/17/2021 0821) LTG: Pt will perform bed mobility with assistance level of: Independent with assistive device    Problem: RH Bed to Chair Transfers Goal: LTG Patient will perform bed/chair  transfers w/assist (PT) Description: LTG: Patient will perform bed to chair transfers with assistance (PT). Outcome: Completed/Met Flowsheets (Taken 07/17/2021 0821) LTG: Pt will perform Bed to Chair Transfers with assistance level: Contact Guard/Touching assist   Problem: RH Car Transfers Goal: LTG Patient will perform car transfers with assist (PT) Description: LTG: Patient will perform car transfers with assistance (PT). Outcome: Completed/Met Flowsheets (Taken 07/17/2021 0821) LTG: Pt will perform car transfers with assist:: Contact Guard/Touching assist   Problem: RH Ambulation Goal: LTG Patient will ambulate in controlled environment (PT) Description: LTG: Patient will ambulate in a controlled environment, # of feet with assistance (PT). Outcome: Completed/Met Flowsheets (Taken 07/17/2021 0821) LTG: Pt will ambulate in controlled environ  assist needed:: Contact Guard/Touching assist LTG: Ambulation distance in controlled environment: >200 ft with RW Goal: LTG Patient will ambulate in home environment (PT) Description: LTG: Patient will ambulate in home environment, # of feet with assistance (PT). Outcome: Completed/Met Flowsheets (Taken 07/17/2021 0821) LTG: Pt will ambulate in home environ  assist needed:: Contact Guard/Touching assist LTG: Ambulation distance in home environment: at least 50 ft using RW with cues for safety

## 2021-07-17 NOTE — Progress Notes (Signed)
Family member called in said that needs to know what all that needs to be done to get pt home tomorrow. Informed that all information has been provided to family. Reports that wife is frazzled and needs to have non emergency transport to pick up pt and needs order for hospice prior to discharge. Informed that needs to speak with sw. Confirmed that has SW number and has already left for the day, needs to try again in am around 0900. Notified floor nurse.

## 2021-07-17 NOTE — Progress Notes (Signed)
Physical Therapy Discharge Summary  Patient Details  Name: Devon Elliott. MRN: [id number] Date of Birth: 10-05-1926  Today's Date: 07/17/2021 PT Individual Time: 0833-0900 PT Individual Time Calculation (min): 27 min   Pt missed 33 min of skilled therapy due to fatigue. Will re-attempt as schedule and pt availability permits.   Patient has met 8 of 10 long term goals due to improved activity tolerance, improved balance, increased strength, and functional use of  right upper extremity and right lower extremity.  Patient to discharge at an ambulatory level  CGA/ supervision .   Patient's care partner requires assistance to provide the necessary physical and cognitive assistance at discharge.  Reasons goals not met: 2 wheelchair goals deemed not applicable as pt is discharging at ambulatory level  Recommendation:  Patient will benefit from ongoing skilled PT services in home health setting to continue to advance safe functional mobility, address ongoing impairments in strength, coordination, balance, activity tolerance, cognition, safety awareness, and to minimize fall risk.  Equipment: RW, if needed  Reasons for discharge: treatment goals met and discharge from hospital  Patient/family agrees with progress made and goals achieved: Yes  PT Discharge Precautions/Restrictions Precautions Precautions: Fall;Other (comment) Precaution Comments: mild global aphasia, can be combative Vital Signs  Pain Pain Assessment Pain Scale: 0-10 Pain Score: 0-No pain Pain Interference Pain Interference Pain Effect on Sleep: 1. Rarely or not at all Pain Interference with Therapy Activities: 1. Rarely or not at all Pain Interference with Day-to-Day Activities: 1. Rarely or not at all Vision/Perception  Vision - History Ability to See in Adequate Light: 0 Adequate Perception Perception: Impaired Praxis Praxis: Impaired Praxis Impairment Details: Motor planning;Perseveration;Initiation   Cognition Overall Cognitive Status: History of cognitive impairments - at baseline Arousal/Alertness: Awake/alert Orientation Level: Disoriented X4 Focused Attention: Impaired Sustained Attention: Impaired Memory: Impaired Awareness: Impaired Problem Solving: Impaired Executive Function: Initiating;Decision Making;Reasoning Reasoning: Impaired Decision Making: Impaired Initiating: Impaired Behaviors: Restless;Perseveration;Physical agitation Safety/Judgment: Impaired Sensation Sensation Light Touch: Appears Intact Coordination Gross Motor Movements are Fluid and Coordinated: No Fine Motor Movements are Fluid and Coordinated: No Coordination and Movement Description: general deconditioning Heel Shin Test: unable to follow instructions Motor  Motor Motor - Discharge Observations: generalized weakness greatly improved since eval  Mobility Bed Mobility Bed Mobility: Rolling Right;Rolling Left;Supine to Sit;Sit to Supine Rolling Right: Independent with assistive device Rolling Left: Independent with assistive device Supine to Sit: Supervision/Verbal cueing;Independent with assistive device Sit to Supine: Independent with assistive device Transfers Transfers: Sit to Stand;Stand Pivot Transfers;Stand to Sit Sit to Stand: Supervision/Verbal cueing;Contact Guard/Touching assist Stand to Sit: Supervision/Verbal cueing;Contact Guard/Touching assist Stand Pivot Transfers: Contact Guard/Touching assist Transfer (Assistive device): Rolling walker (attempts to furniture walk) Locomotion  Gait Ambulation: Yes Gait Assistance: Contact Guard/Touching assist Assistive device: Rolling walker Gait Assistance Details: Verbal cues for safe use of DME/AE;Verbal cues for precautions/safety Gait Gait: Yes Gait Pattern: Impaired Gait Pattern: Step-through pattern;Poor foot clearance - left;Poor foot clearance - right;Wide base of support;Shuffle;Trunk flexed;Decreased stride length (reduced  shuffle stepping since eval) Gait velocity: decreased High Level Ambulation High Level Ambulation: Backwards walking Backwards Walking: continues to demo incr difficulty with motor planning Stairs / Additional Locomotion Stairs: No Ramp: Contact Guard/touching assist Wheelchair Mobility Wheelchair Mobility: Yes (pt will discharge at ambulatory level) Wheelchair Assistance: Maximal Assistance - Patient 25 - 49% Wheelchair Propulsion: Both upper extremities Wheelchair Parts Management: Needs assistance Distance: 150 ft  Trunk/Postural Assessment  Cervical Assessment Cervical Assessment: Exceptions to [facility] (forward head) Thoracic Assessment Thoracic Assessment:  Exceptions to [facility] (kyphotic posture) Lumbar Assessment Lumbar Assessment: Exceptions to [facility] (posterior pelvic tilt) Postural Control Postural Control: Within Functional Limits Righting Reactions: delayed and inadequate Protective Responses: delayed and inadequate  Balance Standardized Balance Assessment Standardized Balance Assessment: FIST Function in Sitting Test = FIST Anterior Nudge: superior sternum: Verbal cues/increased time Anterior Nudge Assistance: Min Assist Posterior Nudge: between scapular spines: Verbal cues/increased time Posterior Nudge Assistance: Min Assist Lateral Nudge: to dominant side at acromion: Upper extremity support Lateral Nudge Assistance: Airline pilot Sitting: 30 seconds: Independent Sitting, Shake "No": left and right: Independent Sitting, Eyes Closed: 30 seconds: Verbal cues/increased time Sitting, Lift Foot: dominant side, lift foot 1 inch twice: Verbal cues/increased time Pick Up Object From Behind: object at midline, hands breadth posterior: Upper extremity support Pick Up Object From Behind Assistance: Min Assist Forward Reach: use dominant arm, must complete full motion: Verbal cues/increased time Lateral Reach: use dominant arm, clear opposite ischial tuberosity: Verbal  cues/increased time Pick Up Object From Floor: from between feet: Verbal cues/increased time Posterior Scooting: move backwards 2 inches: Upper extremity support Anterior Scooting: move forward 2 inches: Upper extremity support Lateral Scooting: move to dominent side 2 inches: Upper extremity support FIST TOTAL SCORE: 39 Static Sitting Balance Static Sitting - Balance Support: Feet supported Static Sitting - Level of Assistance: 6: Modified independent (Device/Increase time) Dynamic Sitting Balance Dynamic Sitting - Balance Support: Feet supported;During functional activity Dynamic Sitting - Level of Assistance: 5: Stand by assistance Sitting balance - Comments: Practicing bending forward with supervision. no LOB while picking item off the floor while seated Static Standing Balance Static Standing - Balance Support: During functional activity;Bilateral upper extremity supported Static Standing - Level of Assistance: 5: Stand by assistance Dynamic Standing Balance Dynamic Standing - Balance Support: During functional activity;Bilateral upper extremity supported Dynamic Standing - Level of Assistance: 5: Stand by assistance Extremity Assessment      RLE Assessment RLE Assessment: Exceptions to [facility] General Strength Comments: grossly 4/ 5 prox to distal LLE Assessment LLE Assessment: Exceptions to [facility] General Strength Comments: grossly 4/ 5 prox to distal  Patient supine in bed and asleep on entrance to room. Initially. Pt not easily roused. Allowed to sleep and returned to room in 30 min when pt more easily roused, alert and agreeable to PT session.   Patient with no pain complaint throughout session.  Therapeutic Activity: Bed Mobility: Patient performed supine <> sit with supervision. No vc required. Transfers: Patient performed sit<>stand and stand pivot transfers throughout session with supervision and extra time required to complete. Pt's cognition and level of dementia requires  pt to need to complete all mobility without physical urging/ tactile cueing or assist from another. If pt feels forced to perform something in any way, he has had a tendency to become aggressive and physically/ verbally acting out. Otherwise, pt will perform any mobility he needs to in order to complete an activity that he feels needs to be completed.   Gait Training:  Patient ambulated room distances using RW with close supervision. Demonstrated unsafe behavior with discard of RW when he has a nearby piece of furniture to utilize for support. Will need further training to maintain use of RW throughout ambulation for safe mobilization about home.   Patient supine  in bed at end of session with brakes locked, bed alarm set, and all needs within reach.   Alger Simons 07/17/2021, 12:34 PM

## 2021-07-18 LAB — URINE CULTURE: Culture: >=100000 — AB

## 2021-07-18 LAB — BASIC METABOLIC PANEL
Anion gap: 6 (ref 5–15)
BUN: 15 mg/dL (ref 8–23)
CO2: 24 mmol/L (ref 22–32)
Calcium: 7.4 mg/dL — ABNORMAL LOW (ref 8.9–10.3)
Chloride: 110 mmol/L (ref 98–111)
Creatinine, Ser: 1.08 mg/dL (ref 0.61–1.24)
GFR, Estimated: >60 mL/min (ref >60)
Glucose, Bld: 101 mg/dL — ABNORMAL HIGH (ref 70–99)
Potassium: 3.5 mmol/L (ref 3.5–5.1)
Sodium: 140 mmol/L (ref 135–145)

## 2021-07-18 MED ORDER — METOPROLOL TARTRATE 50 MG PO TABS: 50.0000 mg | ORAL_TABLET | Freq: Two times a day (BID) | ORAL | 0 refills | Status: DC

## 2021-07-18 MED ORDER — ACETAMINOPHEN 325 MG PO TABS
325.0000 mg | ORAL_TABLET | ORAL | Status: AC | PRN
Start: 2021-07-18 — End: ?

## 2021-07-18 MED ORDER — POLYETHYLENE GLYCOL 3350 17 G PO PACK: 17.0000 g | PACK | Freq: Every day | ORAL | 0 refills | Status: AC | PRN

## 2021-07-18 MED ORDER — AMLODIPINE BESYLATE 5 MG PO TABS: 5.0000 mg | ORAL_TABLET | Freq: Every day | ORAL | 0 refills | Status: AC

## 2021-07-18 MED ORDER — MEMANTINE HCL 5 MG PO TABS: 5.0000 mg | ORAL_TABLET | Freq: Every morning | ORAL | 0 refills | Status: AC

## 2021-07-18 MED ORDER — PROSOURCE PLUS PO LIQD: 30.0000 mL | Freq: Two times a day (BID) | ORAL | Status: AC

## 2021-07-18 MED ORDER — NITROFURANTOIN MONOHYD MACRO 100 MG PO CAPS: 100.0000 mg | ORAL_CAPSULE | Freq: Two times a day (BID) | ORAL | 0 refills | Status: AC

## 2021-07-18 NOTE — Progress Notes (Signed)
Patient slept well through the night. Tolerated crushed meds.Intermittent cath X2 performed this shift and documented. Telesitter monitoring ongoing. Possible D/C today. Safety maintained always.

## 2021-07-18 NOTE — Progress Notes (Signed)
Urine culture results reviewed. Remains afebrile. Order placed to replace Foley catheter.  Component 3 d ago   Specimen Description URINE, CLEAN CATCH   Special Requests NONE  Performed at [facility], [address] [place], [place] [address]   Culture  Abnormal  >=100,000 COLONIES/mL ESCHERICHIA COLI  >=100,000 COLONIES/mL ENTEROCOCCUS FAECALIS    Report Status 07/18/2021 FINAL   Organism ID, Bacteria ESCHERICHIA COLI Abnormal    Organism ID, Bacteria ENTEROCOCCUS FAECALIS Abnormal    Resulting Agency [facility]     Susceptibility   Escherichia coli Enterococcus faecalis    MIC MIC    AMPICILLIN >=32 RESIST... Resistant <=2 SENSITIVE  Sensitive    AMPICILLIN/SULBACTAM 8 SENSITIVE  Sensitive      CEFAZOLIN <=4 SENSITIVE  Sensitive      CEFEPIME <=0.12 SENS... Sensitive      CEFTRIAXONE <=0.25 SENS... Sensitive      CIPROFLOXACIN <=0.25 SENS... Sensitive      GENTAMICIN <=1 SENSITIVE  Sensitive      IMIPENEM <=0.25 SENS... Sensitive      NITROFURANTOIN <=16 SENSIT... Sensitive <=16 SENSIT... Sensitive    PIP/TAZO <=4 SENSITIVE  Sensitive      TRIMETH/SULFA <=20 SENSIT... Sensitive      VANCOMYCIN   1 SENSITIVE  Sensitive             Susceptibility Comments  Escherichia coli  >=100,000 COLONIES/mL ESCHERICHIA COLI  Enterococcus faecalis  >=100,000 COLONIES/mL ENTEROCOCCUS FAECALIS      Specimen Collected: 07/15/21 15:43

## 2021-07-18 NOTE — Progress Notes (Signed)
Foley catheter inserted per order. Sterile field maintained. No complications noted. Clear yellow urine with small blood clots noted.  Mylo Red, LPN

## 2021-07-18 NOTE — Plan of Care (Signed)
Problem: RH Bathing Goal: LTG Patient will bathe all body parts with assist levels (OT) Description: LTG: Patient will bathe all body parts with assist levels (OT) Outcome: Not Met (add Reason) Flowsheets (Taken 07/18/2021 1606) LTG: Pt will perform bathing with assistance level/cueing: (Pt requires up to total A for bathing due to receptive language/cognitive deficits and impaired attention/awareness.) --   Problem: RH Toileting Goal: LTG Patient will perform toileting task (3/3 steps) with assistance level (OT) Description: LTG: Patient will perform toileting task (3/3 steps) with assistance level (OT)  Outcome: Not Met (add Reason) Flowsheets (Taken 07/18/2021 1606) LTG: Pt will perform toileting task (3/3 steps) with assistance level: (Pt requires up to total A for bathing due to receptive language/cognitive deficits and impaired attention/awareness) --   Problem: RH Balance Goal: LTG Patient will maintain dynamic standing with ADLs (OT) Description: LTG:  Patient will maintain dynamic standing balance with assist during activities of daily living (OT)  Outcome: Completed/Met   Problem: Sit to Stand Goal: LTG:  Patient will perform sit to stand in prep for activites of daily living with assistance level (OT) Description: LTG:  Patient will perform sit to stand in prep for activites of daily living with assistance level (OT) Outcome: Completed/Met   Problem: RH Eating Goal: LTG Patient will perform eating w/assist, cues/equip (OT) Description: LTG: Patient will perform eating with assist, with/without cues using equipment (OT) Outcome: Completed/Met   Problem: RH Grooming Goal: LTG Patient will perform grooming w/assist,cues/equip (OT) Description: LTG: Patient will perform grooming with assist, with/without cues using equipment (OT) Outcome: Completed/Met   Problem: RH Dressing Goal: LTG Patient will perform upper body dressing (OT) Description: LTG Patient will perform upper  body dressing with assist, with/without cues (OT). Outcome: Completed/Met Goal: LTG Patient will perform lower body dressing w/assist (OT) Description: LTG: Patient will perform lower body dressing with assist, with/without cues in positioning using equipment (OT) Outcome: Completed/Met   Problem: RH Functional Use of Upper Extremity Goal: LTG Patient will use RT/LT upper extremity as a (OT) Description: LTG: Patient will use right/left upper extremity as a stabilizer/gross assist/diminished/nondominant/dominant level with assist, with/without cues during functional activity (OT) Outcome: Completed/Met   Problem: RH Toilet Transfers Goal: LTG Patient will perform toilet transfers w/assist (OT) Description: LTG: Patient will perform toilet transfers with assist, with/without cues using equipment (OT) Outcome: Completed/Met

## 2021-07-18 NOTE — Progress Notes (Signed)
Patient ID: Devon Elliott., male   DOB: Nov 18, 1926, 88 y.o.   MRN: [id number]  Family requesting medical transport home. SW to arrange PTAR.

## 2021-07-18 NOTE — Progress Notes (Addendum)
Inpatient Rehabilitation Care Coordinator Discharge Note   Patient Details  Name: Devon Elliott. MRN: [id number] Date of Birth: June 09, 1927   Discharge location: Home  Length of Stay: 28 days  Discharge activity level: Mod/max  Home/community participation: home care ([facility]), family  Patient response AL:PFXTKW Literacy - How often do you need to have someone help you when you read instructions, pamphlets, or other written material from your doctor or pharmacy?: Often  Patient response IO:XBDZHG Isolation - How often do you feel lonely or isolated from those around you?: Never  Services provided included: SW, Pharmacy, TR, CM, RN, SLP, OT, PT, RD, MD  Financial Services:  Financial Services Utilized: [employer] MEDICARE  Choices offered to/list presented to: DIL  Follow-up services arranged: Charna Busman, [facility] Home Health Agency: [facility]         Patient response to transportation need: Is the patient able to respond to transportation needs?: Yes In the past 12 months, has lack of transportation kept you from medical appointments or from getting medications?: No In the past 12 months, has lack of transportation kept you from meetings, work, or from getting things needed for daily living?: No    Comments (or additional information):  Patient/Family verbalized understanding of follow-up arrangements:  Yes  Individual responsible for coordination of the follow-up plan: Berton Mount [phone]  Confirmed correct DME delivered: Andria Rhein 07/18/2021    Andria Rhein

## 2021-07-18 NOTE — Progress Notes (Addendum)
PROGRESS NOTE   Subjective/Complaints:  Remains aphasic , repeat K+ normal   ROS: Limited due to language    Objective:   No results found. Recent Labs    07/15/21 1635 07/17/21 0619  WBC 6.9 8.7  HGB 9.7* 9.7*  HCT 30.1* 29.4*  PLT 185 150     Recent Labs    07/17/21 0619 07/18/21 0646  NA 141 140  K 3.4* 3.5  CL 109 110  CO2 22 24  GLUCOSE 97 101*  BUN 17 15  CREATININE 1.12 1.08  CALCIUM 7.8* 7.4*      Intake/Output Summary (Last 24 hours) at 07/18/2021 0835 Last data filed at 07/18/2021 0520 Gross per 24 hour  Intake 520 ml  Output 1200 ml  Net -680 ml         Physical Exam: Vital Signs Blood pressure (!) 146/95, pulse 69, temperature 98.7 F (37.1 C), resp. rate 15, weight 67.1 kg, SpO2 94 %.   General: No acute distress Mood and affect are appropriate Heart: Regular rate and rhythm no rubs murmurs or extra sounds Lungs: Clear to auscultation, breathing unlabored, no rales or wheezes Abdomen: Positive bowel sounds, soft nontender to palpation, nondistended Extremities: No clubbing, cyanosis, or edema Skin: No evidence of breakdown, no evidence of rash  Psych: impaired understanding, though pleasant, impulsive Skin: No evidence of breakdown, no evidence of rash , erythema RIght 4th digit Neurologic: a little lethargic.aphasic, follows 25% or less of simple one step commands. Cranial nerves II through XII intact, motor strength is 5/5 in left and 3- RIght deltoid, bicep, tricep, grip, 5/5 bilateral hip flexor, knee extensors, ankle dorsiflexor and plantar flexor Musculoskeletal: right hand pain    Assessment/Plan: 1. Functional deficits due to L MCA infarct Stable for D/C today F/u PCP in 3-4 weeks F/u PM&R 2 weeks See D/C summary See D/C instructions  Care Tool:  Bathing    Body parts bathed by patient: Chest, Face, Right upper leg, Left upper leg, Right lower leg, Left lower leg    Body parts bathed by helper: Right arm, Left arm, Front perineal area, Buttocks, Left lower leg, Right lower leg     Bathing assist Assist Level: Contact Guard/Touching assist     Upper Body Dressing/Undressing Upper body dressing   What is the patient wearing?: Pull over shirt    Upper body assist Assist Level: Minimal Assistance - Patient > 75%    Lower Body Dressing/Undressing Lower body dressing      What is the patient wearing?: Pants     Lower body assist Assist for lower body dressing: Minimal Assistance - Patient > 75%     Toileting Toileting    Toileting assist Assist for toileting: Total Assistance - Patient < 25%     Transfers Chair/bed transfer  Transfers assist     Chair/bed transfer assist level: Contact Guard/Touching assist Chair/bed transfer assistive device: Programmer, multimedia   Ambulation assist      Assist level: Supervision/Verbal cueing Assistive device: Walker-rolling Max distance: 200'   Walk 10 feet activity   Assist     Assist level: Supervision/Verbal cueing Assistive device: Walker-rolling   Walk 50  feet activity   Assist Walk 50 feet with 2 turns activity did not occur: Safety/medical concerns  Assist level: Contact Guard/Touching assist Assistive device: Walker-rolling    Walk 150 feet activity   Assist Walk 150 feet activity did not occur: Safety/medical concerns  Assist level: Supervision/Verbal cueing Assistive device: Walker-rolling    Walk 10 feet on uneven surface  activity   Assist Walk 10 feet on uneven surfaces activity did not occur: Safety/medical concerns         Wheelchair     Assist Is the patient using a wheelchair?: Yes Type of Wheelchair: Manual    Wheelchair assist level: Moderate Assistance - Patient 50 - 74% Max wheelchair distance: 150    Wheelchair 50 feet with 2 turns activity    Assist        Assist Level: Moderate Assistance - Patient 50 -  74%   Wheelchair 150 feet activity     Assist      Assist Level: Moderate Assistance - Patient 50 - 74%   Blood pressure (!) 146/95, pulse 69, temperature 98.7 F (37.1 C), resp. rate 15, weight 67.1 kg, SpO2 94 %.  Medical Problem List and Plan: 1. Functional deficits secondary to left MCA infarct likely secondary to embolism Righ themiparesis and moderate global aphasia , suspect apraxia as well              -patient may  shower            d/c today   -Continue CIR therapies including PT, OT, and SLP   2.  Antithrombotics: -DVT/anticoagulation:  Pharmaceutical: Other (comment)  apixaban 5 mg BID             -antiplatelet therapy: none 3. Pain: continue Tylenol Added diclofenac gel to RIght hand - hand pain as per ortho suspect gout but septic arthritis in DDx await MRI RIght hand  12/23- got MRI no septic arthritis- -will continue regimen 4. Agitation: Telesitter for safety, Depakote,              -antipsychotic agents: continue Seroquel 25 mg daily and q HS, Seroquel 25 mg q 6 hours PRN agitation;  Namenda 5. Neuropsych: This patient is not capable of making decisions on his own behalf. 6. Skin/Wound Care: routine skin checks 7. Fluids/Electrolytes/Nutrition: routine ins and outs with follow-up chemistries. Hypokalemia resolved. 8. Atrial fibrillation: rate controlled. Increase lopressor to 39m BID as in #9, Eliquis 5 mg daily 9. Hypertension: goal 1092-330systolic BP post thrombectomy. Continue metoprolol. Increase amlodipine to 573m Increase flomax to 0.6m84mPlaced paramaters for lopressor to please hold if SBP <100 or DBP<60 or HR <60 Vitals:   07/17/21 2029 07/18/21 0510  BP: (!) 163/94 (!) 146/95  Pulse: 69 69  Resp:    Temp: 98.1 F (36.7 C) 98.7 F (37.1 C)  SpO2: 95% 94%  Continue Lopressor to 56m107mD, some lability will need to f/u with PCP for further adjustment    10: Hyperlipidemia: LDL =  110; goal <70. Started on high intensity statin: atorvastatin 40  mg. Continue at discharge. 11: Delirium with history of mild dementia. Continue Depakote, Seroquel and Namenda Reduced comprehension associated with receptive language deficits are contributing  12: HFrEF: currently euvolemic 13: AKI: resolved.    BMP Latest Ref Rng & Units 07/18/2021 07/17/2021 07/09/2021  Glucose 70 - 99 mg/dL 101(H) 97 106(H)  BUN 8 - 23 mg/dL _0 Creatinine 0.61 - 1.24 mg/dL 1.08 1.12 1.09  Sodium  135 - 145 mmol/L 140 141 141  Potassium 3.5 - 5.1 mmol/L 3.5 3.4(L) 3.8  Chloride 98 - 111 mmol/L 110 109 114(H)  CO2 22 - 32 mmol/L 24 22 20(L)  Calcium 8.9 - 10.3 mg/dL 7.4(L) 7.8(L) 7.8(L)   14: Urinary retention/bloody clots:  -Increase flomax to 0.63m, bp can tolerate Will replace foley this am , f/u urology  15. Leukocytosis,Resolved     16.RIght fourth digit swelling, x-rays show erosive changes likely chronic however given some elevation of white cells as well as elevated ESR and CRP would pursue MRI of the finger/hand right side, appreciate Ortho assistance  12/23- MRI (-) except OA plus mild subQ tissue edema 17. Dysphagia, needs to have good upright posture and minimal distractions during meals  18. UTI: Devon coli sens to macrobid finish 7d course  LOS: 19 days A [place] TO [place] Devon [place] 07/18/2021, 8:35 AM

## 2021-07-18 NOTE — Progress Notes (Signed)
SW to handoff discharge packet to [facility]. Belongings gathered.Pt left with foley per order. Family aware of PTAR for transport. No further questions from family. No complications noted. Pt left per stretcher with PTAR to home. Mylo Red, LPN

## 2021-07-18 NOTE — Progress Notes (Signed)
Patient ID: Devon Elliott., male   DOB: 02-26-1927, 88 y.o.   MRN: [id number]  SW spoke with Marchelle Folks at [facility] to confirm patient d/c.

## 2021-07-18 NOTE — Progress Notes (Signed)
Patient ID: Devon Elliott., male   DOB: 1927/02/10, 88 y.o.   MRN: [id number]  Nebulizer ordered through [employer].

## 2021-07-18 NOTE — Progress Notes (Signed)
Informed SLP regarding pt increased coughing with liquid intake starting yesterday. SLP not concerned for discharge barrier at this time. Mylo Red, LPN

## 2021-07-18 NOTE — Progress Notes (Signed)
Occupational Therapy Discharge Summary  Patient Details  Name: Devon Elliott. MRN: [id number] Date of Birth: 02-May-1927    Patient has met 8 of 10 long term goals due to improved activity tolerance, improved balance, postural control, ability to compensate for deficits, functional use of  RIGHT upper extremity, and improved coordination.  Patient to discharge at [facility] Assist level.  Patient's care partner is independent to provide the necessary physical and cognitive assistance at discharge.    Reasons goals not met: Pt requires up to total A for bathing due to receptive language/cognitive deficits and impaired attention/emergent awareness. ADL performance below reports most consistent performance when pt is alert / non agitated.   Recommendation:  Patient will benefit from ongoing skilled OT services in home health setting to continue to advance functional skills in the area of BADL and Reduce care partner burden.  Equipment:. 3in1  Reasons for discharge: treatment goals met and discharge from hospital  Patient/family agrees with progress made and goals achieved: Yes  OT Discharge Precautions/Restrictions  Precautions Precautions: Fall;Other (comment) Precaution Comments: global aphasia, baseline dementia Restrictions Weight Bearing Restrictions: No  ADL ADL Eating: Supervision/safety Where Assessed-Eating: Bed level, Edge of bed Grooming: Supervision/safety Where Assessed-Grooming: Standing at sink Upper Body Bathing: Moderate assistance Where Assessed-Upper Body Bathing: Edge of bed Lower Body Bathing: Maximal assistance, Dependent Where Assessed-Lower Body Bathing: Edge of bed Upper Body Dressing: Minimal assistance Where Assessed-Upper Body Dressing: Edge of bed Lower Body Dressing: Minimal assistance Where Assessed-Lower Body Dressing: Edge of bed Toileting: Dependent, Maximal assistance Where Assessed-Toileting: Toilet, Bed level Toilet Transfer:  Therapist, music Method: Counselling psychologist: Grab bars, Radiographer, therapeutic: Not assessed Social research officer, government: Not assessed Vision Baseline Vision/History: 0 No visual deficits (diffuclty fully assessing due to language/cognitive deficits) Patient Visual Report: Other (comment) (unable to state due to language/cognitive deficits) Vision Assessment?: No apparent visual deficits Perception  Perception: Impaired Inattention/Neglect: Impaired-to be further tested in functional context Praxis Praxis: Impaired Praxis Impairment Details: Motor planning;Perseveration;Initiation;Ideomotor Cognition Overall Cognitive Status: History of cognitive impairments - at baseline Arousal/Alertness: Awake/alert Orientation Level: Oriented to person Year: Other (Comment) (unable to state due to language/cognitive deficits) Month:  (unable to state due to language/cognitive deficits) Day of Week:  (unable to state due to language/cognitive deficits) Attention: Focused Focused Attention: Impaired Focused Attention Impairment: Verbal basic;Functional basic Memory: Impaired Memory Impairment: Storage deficit;Decreased recall of new information Immediate Memory Recall:  (unable to state due to language/cognitive deficits) Memory Recall Sock:  (unable to state due to language/cognitive deficits) Memory Recall Blue:  (unable to state due to language/cognitive deficits) Memory Recall Bed:  (unable to state due to language/cognitive deficits) Awareness: Impaired Awareness Impairment: Intellectual impairment Problem Solving: Impaired Behaviors: Restless;Perseveration;Physical agitation;Verbal agitation;Impulsive Safety/Judgment: Impaired Sensation Sensation Light Touch: Appears Intact Hot/Cold: Appears Intact Proprioception: Appears Intact Stereognosis: Appears Intact Coordination Gross Motor Movements are Fluid and Coordinated: No Fine Motor Movements  are Fluid and Coordinated: No (occasional difficulty opening/manipulating ADL items) Coordination and Movement Description: general deconditioning Finger Nose Finger Test: unable to follow instructions Motor  Motor Motor: Other (comment) Motor - Discharge Observations: generalized weakness greatly improved since eval Mobility  Bed Mobility Bed Mobility: Supine to Sit;Sit to Supine Supine to Sit: Supervision/Verbal cueing Sit to Supine: Supervision/Verbal cueing Transfers Sit to Stand: Supervision/Verbal cueing;Contact Guard/Touching assist Stand to Sit: Supervision/Verbal cueing;Contact Guard/Touching assist  Trunk/Postural Assessment  Cervical Assessment Cervical Assessment: Exceptions to [facility] (forward head) Thoracic Assessment Thoracic Assessment: Exceptions to [facility] (  kyphotic posture) Lumbar Assessment Lumbar Assessment: Exceptions to [facility] (posterior pelvic tilt) Postural Control Postural Control: Deficits on evaluation (relies on BUE support on RW or furniture walking)  Balance Balance Balance Assessed: Yes Static Sitting Balance Static Sitting - Balance Support: Feet supported Static Sitting - Level of Assistance: 5: Stand by assistance Dynamic Sitting Balance Dynamic Sitting - Balance Support: Feet supported;During functional activity Dynamic Sitting - Level of Assistance: 5: Stand by assistance Static Standing Balance Static Standing - Balance Support: During functional activity;Bilateral upper extremity supported Static Standing - Level of Assistance: 5: Stand by assistance Dynamic Standing Balance Dynamic Standing - Balance Support: During functional activity;Bilateral upper extremity supported Dynamic Standing - Level of Assistance: 5: Stand by assistance Extremity/Trunk Assessment RUE Assessment RUE Assessment: Within Functional Limits General Strength Comments: Pt able to incorporate functionally for ADL, unable to follow commands for MMT/ROM, at least 3+/5 LUE  Assessment LUE Assessment: Within Functional Limits General Strength Comments: Pt able to incorporate functionally for ADL, unable to follow commands for MMT/ROM, at least 3+/5   Devon Napoleon MS, OTR/L  07/18/2021, 4:00 PM

## 2021-07-20 LAB — CULTURE, BLOOD (ROUTINE X 2)
Culture: NO GROWTH
Culture: NO GROWTH
Special Requests: ADEQUATE
Special Requests: ADEQUATE

## 2021-07-26 ENCOUNTER — Other Ambulatory Visit: Payer: Self-pay | Admitting: Physician Assistant

## 2021-07-28 ENCOUNTER — Encounter: Payer: Medicare Other | Admitting: Registered Nurse

## 2021-08-07 ENCOUNTER — Other Ambulatory Visit: Payer: Self-pay | Admitting: Physician Assistant

## 2021-08-08 ENCOUNTER — Other Ambulatory Visit: Payer: Self-pay | Admitting: Physical Medicine & Rehabilitation

## 2021-09-05 ENCOUNTER — Other Ambulatory Visit: Payer: Self-pay | Admitting: Physical Medicine & Rehabilitation

## 2021-09-20 NOTE — Progress Notes (Unsigned)
[facility] [address] [place]. [place] [address] [phone]       HOSPITAL FOLLOW UP NOTE  [name]. Date of Birth:  30-Jan-1927 Medical Record Number:  [id number]   Reason for Referral:  hospital stroke follow up    SUBJECTIVE:   CHIEF COMPLAINT:  No chief complaint on file.   HPI:   [name]. is a 88 y.o. male with history of HTN, atrial fibrillation not on anticoagulation and mild dementia who presented on 06/19/2021 with aphasia and right sided weakness.  Personally reviewed hospitalization pertinent progress notes, lab work and imaging.  Stroke work-up revealed left MCA infarct s/p IR with TICI 3 revascularization, embolic likely secondary to A-fib not on AC.  CTA head/neck > 50% ICA stenosis bilaterally and distal left M2 MCA occlusion.  EF 45% with global hypokinesis and dilated left atrium, no evidence of thrombus.  LDL 110.  A1c 5.3.  Initiated Eliquis 5 mg twice daily with history of A-fib and stroke prevention and initiate atorvastatin 40 mg daily.  Hospital course complicated by postop delirium/confusion likely due to sundowning from cognitive impairment/dementia at baseline, placed on Seroquel and Depakote and continue Namenda.  Discharged to CIR on 06/29/2021 per therapy recommendations.         PERTINENT IMAGING  Per hospitalization 06/19/2021 Code Stroke CT head No acute abnormality. Chronic microvascular disease. ASPECTS 10.    CTA head & neck Less than 50% stenosis of right and left carotid arteries, distal left M2 MCA occlusion CT perfusion 90 mL of penumbra in left MCA territory s/p IR with TICI3 of left M2 MCA CT repeat no acute infarct, no bleeding MRI left MCA infarct in posterior left frontoparietal region 2D Echo EF 45%.  Global hypokinesis.  Dilated left atrium.  LDL 110 HgbA1c 5.3        ROS:   14 system review of systems performed and negative with exception of ***  PMH:  Past  Medical History:  Diagnosis Date   Complete tear of left rotator cuff 07/21/2014   Essential hypertension 04/19/2014   Impingement syndrome of left shoulder 07/21/2014   Left hip pain 02/08/2016   Left supraspinatus tenosynovitis 07/21/2014   Pure hypercholesterolemia 04/19/2014    PSH:  Past Surgical History:  Procedure Laterality Date   ELBOW SURGERY  1995   ESOPHAGOGASTRODUODENOSCOPY N/A 05/01/2020   Procedure: ESOPHAGOGASTRODUODENOSCOPY (EGD);  Surgeon: Toledo, Benay Pike, MD;  Location: [facility];  Service: Gastroenterology;  Laterality: N/A;   ESOPHAGOGASTRODUODENOSCOPY (EGD) WITH PROPOFOL N/A 05/31/2016   Procedure: ESOPHAGOGASTRODUODENOSCOPY (EGD) WITH PROPOFOL;  Surgeon: Jonathon Bellows, MD;  Location: [facility];  Service: Endoscopy;  Laterality: N/A;   IR CT HEAD LTD  06/19/2021   IR PERCUTANEOUS ART THROMBECTOMY/INFUSION INTRACRANIAL INC DIAG ANGIO  06/19/2021   IR US GUIDE VASC ACCESS RIGHT  06/19/2021   KYPHOPLASTY N/A 07/30/2019   Procedure: L1 KYPHOPLASTY;  Surgeon: Hessie Knows, MD;  Location: [facility] OR[facility];  Service: Orthopedics;  Laterality: N/A;   RADIOLOGY WITH ANESTHESIA N/A 06/19/2021   Procedure: IR WITH ANESTHESIA;  Surgeon: Radiologist, Medication, MD;  Location: [facility];  Service: Radiology;  Laterality: N/A;    Social History:  Social History   Socioeconomic History   Marital status: Married    Spouse name: Not on file   Number of children: Not on file   Years of education: Not on file   Highest education level: Not on file  Occupational History   Not on file  Tobacco Use   Smoking status: Former   Smokeless tobacco: Never  Substance and Sexual Activity   Alcohol use: Yes    Comment: social   Drug use: No   Sexual activity: Not on file  Other Topics Concern   Not on file  Social History Narrative   Not on file   Social Determinants of Health   Financial Resource Strain: Not on file  Food Insecurity: Not on file  Transportation Needs: Not on file   Physical Activity: Not on file  Stress: Not on file  Social Connections: Not on file  Intimate Partner Violence: Not on file    Family History:  Family History  Problem Relation Age of Onset   Pancreatic cancer Mother    Diabetes Father     Medications:   Current Outpatient Medications on File Prior to Visit  Medication Sig Dispense Refill   acetaminophen (TYLENOL) 325 MG tablet Take 1-2 tablets (325-650 mg total) by mouth every 4 (four) hours as needed for mild pain.     amLODipine (NORVASC) 5 MG tablet Take 1 tablet (5 mg total) by mouth daily. 30 tablet 0   apixaban (ELIQUIS) 5 MG TABS tablet Take 1 tablet (5 mg total) by mouth 2 (two) times daily. 60 tablet 0   atorvastatin (LIPITOR) 40 MG tablet Take 1 tablet (40 mg total) by mouth daily. 30 tablet 0   Cholecalciferol 25 MCG (1000 UT) capsule Take 1,000 Units by mouth daily.     divalproex (DEPAKOTE SPRINKLE) 125 MG capsule Take 1 capsule (125 mg total) by mouth every 8 (eight) hours. 90 capsule 0   memantine (NAMENDA) 5 MG tablet Take 1 tablet (5 mg total) by mouth every morning. 30 tablet 0   metoprolol tartrate (LOPRESSOR) 50 MG tablet TAKE ONE (1) TABLET BY MOUTH TWO TIMES PER DAY 60 tablet 0   nitrofurantoin, macrocrystal-monohydrate, (MACROBID) 100 MG capsule Take 1 capsule (100 mg total) by mouth every 12 (twelve) hours. 20 capsule 0   Nutritional Supplements (,FEEDING SUPPLEMENT, PROSOURCE PLUS) liquid Take 30 mLs by mouth 2 (two) times daily between meals.     pantoprazole (PROTONIX) 40 MG tablet Take 1 tablet (40 mg total) by mouth daily at 12 noon. 30 tablet 0   polyethylene glycol (MIRALAX / GLYCOLAX) 17 g packet Take 17 g by mouth daily as needed for mild constipation. 14 each 0   QUEtiapine (SEROQUEL) 25 MG tablet Take 1 tablet (25 mg total) by mouth at bedtime. 30 tablet 0   QUEtiapine (SEROQUEL) 25 MG tablet Take 1 tablet (25 mg total) by mouth daily at 6 (six) AM. 30 tablet 0   No current facility-administered  medications on file prior to visit.    Allergies:   Allergies  Allergen Reactions   Ace Inhibitors     Other reaction(s): Kidney Disorder   Clarithromycin Hives   Etodolac     Other reaction(s): Kidney Disorder   Levofloxacin Swelling      OBJECTIVE:  Physical Exam  There were no vitals filed for this visit. There is no height or weight on file to calculate BMI. No results found.  No flowsheet data found.   General: well developed, well nourished, seated, in no evident distress Head: head normocephalic and atraumatic.   Neck: supple with no carotid or supraclavicular bruits Cardiovascular: regular rate and rhythm, no murmurs Musculoskeletal: no deformity Skin:  no rash/petichiae Vascular:  Normal pulses all extremities   Neurologic Exam Mental Status: Awake and fully alert.  Oriented to place and time. Recent and remote memory intact. Attention span, concentration and fund of knowledge appropriate. Mood and affect appropriate.  Cranial Nerves: Fundoscopic exam reveals sharp disc margins. Pupils equal, briskly reactive to light. Extraocular movements full without nystagmus. Visual fields full to confrontation. Hearing intact. Facial sensation intact. Face, tongue, palate moves normally and symmetrically.  Motor: Normal bulk and tone. Normal strength in all tested extremity muscles Sensory.: intact to touch , pinprick , position and vibratory sensation.  Coordination: Rapid alternating movements normal in all extremities. Finger-to-nose and heel-to-shin performed accurately bilaterally. Gait and Station: Arises from chair without difficulty. Stance is normal. Gait demonstrates normal stride length and balance with ***. Tandem walk and heel toe ***.  Reflexes: 1+ and symmetric. Toes downgoing.     NIHSS  *** Modified Rankin  ***      ASSESSMENT: Devon Elliott. is a 88 y.o. year old male with left MCA infarct in setting of left M2 MCA occlusion on 06/19/2021 s/p  IR with TICI 3 reperfusion, embolic likely secondary to A-fib not on AC. Vascular risk factors include A-fib, HTN, HLD, advanced age and baseline mild dementia.      PLAN:  Left MCA stroke: Residual deficit: ***. Continue Eliquis (apixaban) daily  and atorvastatin 40 mg daily for secondary stroke prevention.  Discussed secondary stroke prevention measures and importance of close PCP follow up for aggressive stroke risk factor management. I have gone over the pathophysiology of stroke, warning signs and symptoms, risk factors and their management in some detail with instructions to go to the closest emergency room for symptoms of concern. Atrial fibrillation: continue Eliquis 5mg  twice daily managed by cardiology  HTN: BP goal <130/90.  Stable on *** per PCP HLD: LDL goal <70. Recent LDL 110.  Continue atorvastatin 40 mg daily     Follow up in *** or call earlier if needed   CC:  [facility] provider: Dr. Leonie Man PCP: Rusty Aus, MD    I spent *** minutes of face-to-face and non-face-to-face time with patient.  This included previsit chart review including review of recent hospitalization, lab review, study review, order entry, electronic health record documentation, patient education regarding recent stroke including etiology, secondary stroke prevention measures and importance of managing stroke risk factors, residual deficits and typical recovery time and answered all other questions to patient satisfaction   Frann Rider, AGNP-BC  [facility] [address] [place], [place] [address]  Phone [phone] Fax [phone] Note: This document was prepared with digital dictation and possible smart phrase technology. Any transcriptional errors that result from this process are unintentional.

## 2021-09-21 ENCOUNTER — Encounter: Payer: Self-pay | Admitting: Adult Health

## 2021-09-21 ENCOUNTER — Inpatient Hospital Stay: Payer: Medicare Other | Admitting: Adult Health

## 2021-10-06 ENCOUNTER — Other Ambulatory Visit: Payer: Self-pay

## 2021-10-06 NOTE — Patient Outreach (Signed)
[facility]) Care Management ? ?10/06/2021 ? ?[place] ?15-Apr-1927 ?[id number] ? ? ?Telephone outreach to patient's son to obtain mRS, Son informed that patient is deceased. MRS= 6 ? ? ?Philmore Pali ?Homeland Management Assistant ?[phone] ? ? ?

## 2021-10-14 DEATH — deceased

## 2022-06-23 IMAGING — CR DG CHEST 2V
2 series · 2 of 2 positions shown · non-contrast
Comparison: July 05, 2019

CLINICAL DATA: Fever.

EXAM:
CHEST - 2 VIEW

[chest lat]
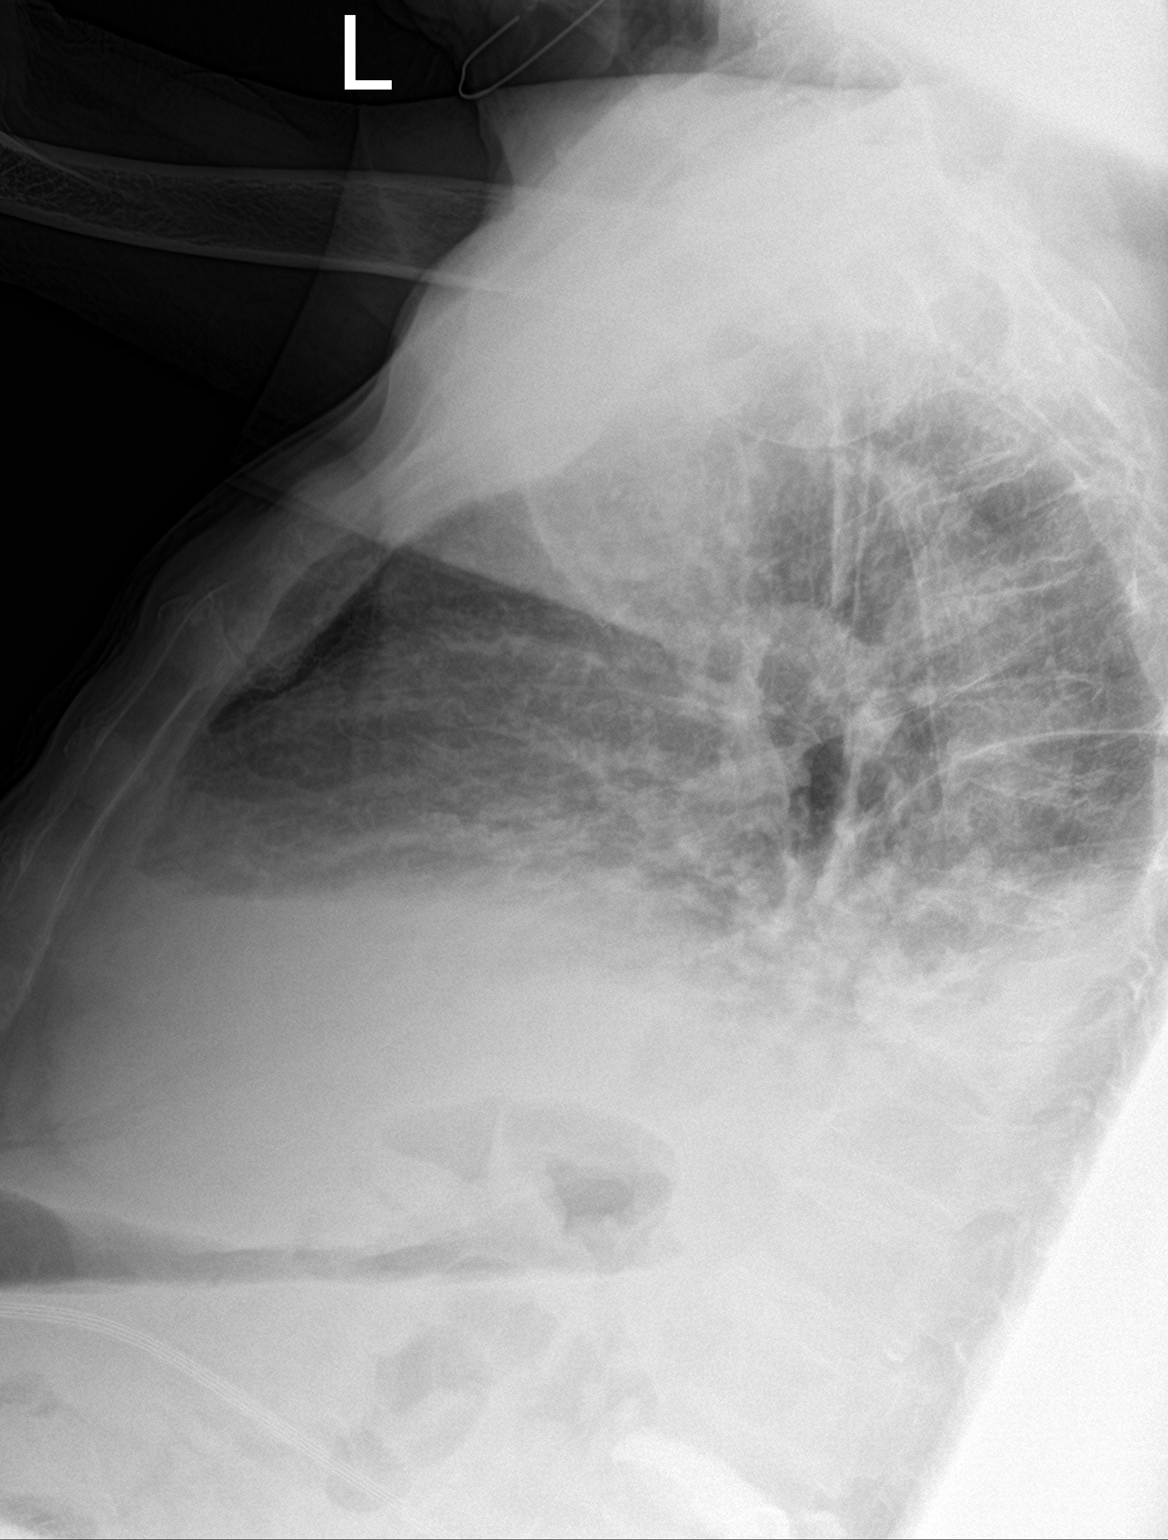

[chest ap]
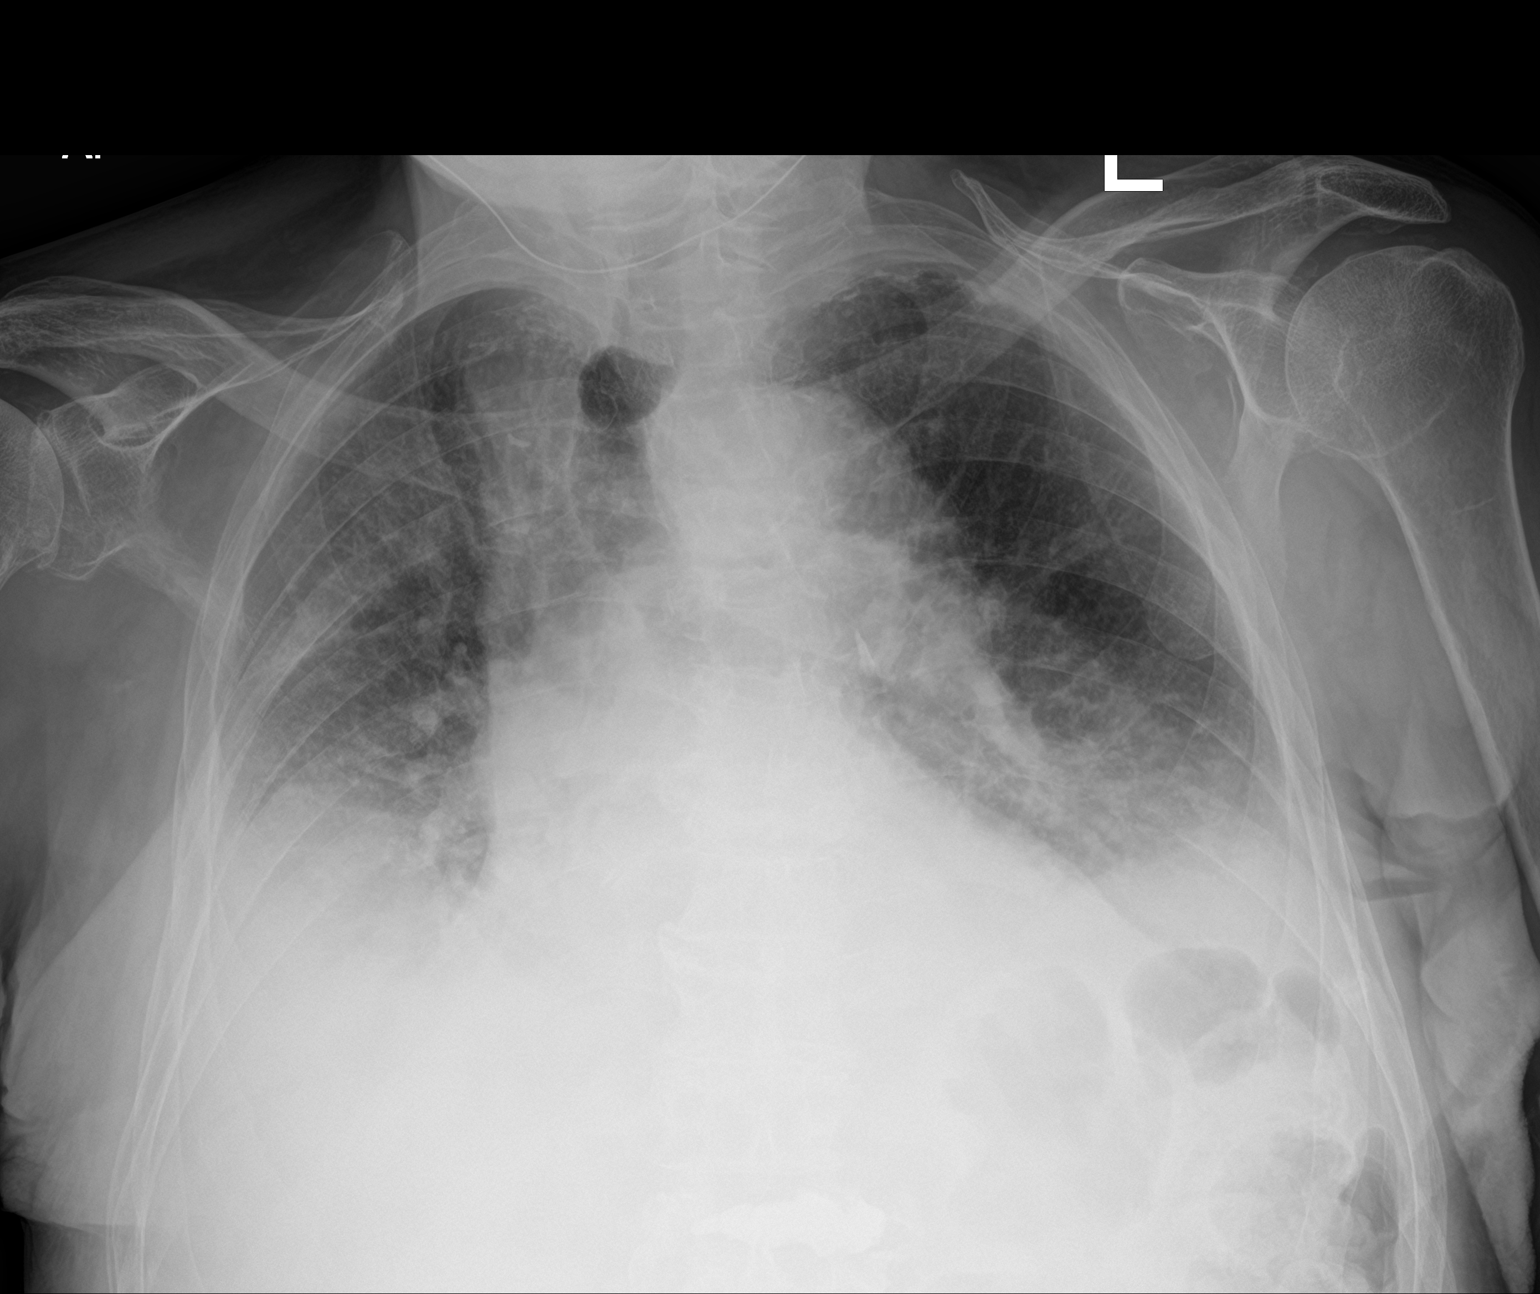

[2 of 2 positions shown; findings below may reference images not displayed]

FINDINGS: Low lung volumes are seen with mild, diffusely increased
interstitial lung markings. Mild to moderate severity areas of
atelectasis and/or infiltrate are seen within the bilateral lung
bases. There is no evidence of a pleural effusion or pneumothorax.
The cardiac silhouette is mildly enlarged. Evidence of prior
vertebroplasty is noted within the upper lumbar spine.
IMPRESSION: Low lung volumes with mild to moderate severity bibasilar
atelectasis and/or infiltrate.
